# Patient Record
Sex: Female | Born: 1985
Health system: Southern US, Community
[De-identification: ages and names within clinical notes are randomized; demographics above are authoritative.]

## PROBLEM LIST (undated history)

## (undated) DIAGNOSIS — F329 Major depressive disorder, single episode, unspecified: Secondary | ICD-10-CM

## (undated) DIAGNOSIS — E282 Polycystic ovarian syndrome: Secondary | ICD-10-CM

## (undated) DIAGNOSIS — T8092XA Unspecified transfusion reaction, initial encounter: Secondary | ICD-10-CM

## (undated) DIAGNOSIS — F32A Depression, unspecified: Secondary | ICD-10-CM

## (undated) DIAGNOSIS — G4730 Sleep apnea, unspecified: Secondary | ICD-10-CM

## (undated) DIAGNOSIS — F419 Anxiety disorder, unspecified: Secondary | ICD-10-CM

## (undated) DIAGNOSIS — N200 Calculus of kidney: Secondary | ICD-10-CM

## (undated) DIAGNOSIS — B029 Zoster without complications: Secondary | ICD-10-CM

## (undated) DIAGNOSIS — T7840XA Allergy, unspecified, initial encounter: Secondary | ICD-10-CM

## (undated) DIAGNOSIS — N3010 Interstitial cystitis (chronic) without hematuria: Secondary | ICD-10-CM

## (undated) DIAGNOSIS — F909 Attention-deficit hyperactivity disorder, unspecified type: Secondary | ICD-10-CM

## (undated) DIAGNOSIS — F3181 Bipolar II disorder: Secondary | ICD-10-CM

## (undated) HISTORY — DX: Attention-deficit hyperactivity disorder, unspecified type: F90.9

## (undated) HISTORY — PX: OTHER SURGICAL HISTORY: SHX169

## (undated) HISTORY — DX: Allergy, unspecified, initial encounter: T78.40XA

## (undated) HISTORY — PX: KIDNEY STONE SURGERY: SHX686

## (undated) HISTORY — PX: TONSILLECTOMY AND ADENOIDECTOMY: SUR1326

## (undated) HISTORY — DX: Sleep apnea, unspecified: G47.30

## (undated) HISTORY — PX: TUBAL LIGATION: SHX77

## (undated) HISTORY — PX: KNEE ARTHROSCOPY: SHX127

## (undated) HISTORY — DX: Calculus of kidney: N20.0

## (undated) HISTORY — DX: Zoster without complications: B02.9

## (undated) HISTORY — DX: Anxiety disorder, unspecified: F41.9

## (undated) HISTORY — DX: Bipolar II disorder: F31.81

## (undated) HISTORY — DX: Depression, unspecified: F32.A

## (undated) HISTORY — DX: Major depressive disorder, single episode, unspecified: F32.9

## (undated) HISTORY — DX: Unspecified transfusion reaction, initial encounter: T80.92XA

---
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
## 2003-02-22 ENCOUNTER — Encounter: Payer: Self-pay | Admitting: Family Medicine

## 2003-02-22 LAB — CONVERTED CEMR LAB: TSH: 1.64 microintl units/mL

## 2003-02-27 ENCOUNTER — Emergency Department (HOSPITAL_COMMUNITY): Admission: AD | Admit: 2003-02-27 | Discharge: 2003-02-27 | Payer: Self-pay | Admitting: Family Medicine

## 2003-10-11 ENCOUNTER — Emergency Department (HOSPITAL_COMMUNITY): Admission: EM | Admit: 2003-10-11 | Discharge: 2003-10-11 | Payer: Self-pay | Admitting: Family Medicine

## 2003-11-08 ENCOUNTER — Encounter (INDEPENDENT_AMBULATORY_CARE_PROVIDER_SITE_OTHER): Payer: Self-pay | Admitting: *Deleted

## 2003-11-08 ENCOUNTER — Ambulatory Visit (HOSPITAL_COMMUNITY): Admission: RE | Admit: 2003-11-08 | Discharge: 2003-11-09 | Payer: Self-pay | Admitting: *Deleted

## 2004-08-14 ENCOUNTER — Ambulatory Visit: Payer: Self-pay | Admitting: Family Medicine

## 2004-09-11 ENCOUNTER — Inpatient Hospital Stay (HOSPITAL_COMMUNITY): Admission: AD | Admit: 2004-09-11 | Discharge: 2004-09-12 | Payer: Self-pay | Admitting: *Deleted

## 2004-09-14 ENCOUNTER — Other Ambulatory Visit: Admission: RE | Admit: 2004-09-14 | Discharge: 2004-09-14 | Payer: Self-pay | Admitting: Obstetrics and Gynecology

## 2004-12-06 ENCOUNTER — Ambulatory Visit (HOSPITAL_COMMUNITY): Admission: RE | Admit: 2004-12-06 | Discharge: 2004-12-06 | Payer: Self-pay | Admitting: Obstetrics and Gynecology

## 2004-12-06 ENCOUNTER — Encounter (INDEPENDENT_AMBULATORY_CARE_PROVIDER_SITE_OTHER): Payer: Self-pay | Admitting: Specialist

## 2005-01-01 ENCOUNTER — Other Ambulatory Visit: Admission: RE | Admit: 2005-01-01 | Discharge: 2005-01-01 | Payer: Self-pay | Admitting: Obstetrics and Gynecology

## 2005-02-16 ENCOUNTER — Encounter (INDEPENDENT_AMBULATORY_CARE_PROVIDER_SITE_OTHER): Payer: Self-pay | Admitting: Specialist

## 2005-02-16 ENCOUNTER — Ambulatory Visit (HOSPITAL_BASED_OUTPATIENT_CLINIC_OR_DEPARTMENT_OTHER): Admission: RE | Admit: 2005-02-16 | Discharge: 2005-02-16 | Payer: Self-pay | Admitting: Urology

## 2005-02-21 ENCOUNTER — Ambulatory Visit: Payer: Self-pay | Admitting: Family Medicine

## 2005-03-22 ENCOUNTER — Ambulatory Visit: Payer: Self-pay | Admitting: Family Medicine

## 2005-04-23 ENCOUNTER — Ambulatory Visit: Payer: Self-pay | Admitting: Family Medicine

## 2005-06-21 ENCOUNTER — Emergency Department (HOSPITAL_COMMUNITY): Admission: EM | Admit: 2005-06-21 | Discharge: 2005-06-21 | Payer: Self-pay | Admitting: Family Medicine

## 2005-11-16 ENCOUNTER — Encounter: Payer: Self-pay | Admitting: Family Medicine

## 2005-11-16 ENCOUNTER — Ambulatory Visit: Payer: Self-pay | Admitting: Family Medicine

## 2005-11-16 LAB — CONVERTED CEMR LAB: TSH: 1.24 microintl units/mL

## 2006-01-25 ENCOUNTER — Emergency Department (HOSPITAL_COMMUNITY): Admission: EM | Admit: 2006-01-25 | Discharge: 2006-01-25 | Payer: Self-pay | Admitting: Emergency Medicine

## 2006-01-30 ENCOUNTER — Ambulatory Visit: Payer: Self-pay | Admitting: Family Medicine

## 2006-05-07 ENCOUNTER — Ambulatory Visit: Payer: Self-pay | Admitting: Family Medicine

## 2006-05-07 LAB — CONVERTED CEMR LAB: H Pylori IgG: NEGATIVE

## 2006-05-13 ENCOUNTER — Ambulatory Visit: Payer: Self-pay | Admitting: Psychiatry

## 2006-05-21 ENCOUNTER — Ambulatory Visit: Payer: Self-pay | Admitting: Psychiatry

## 2006-05-29 ENCOUNTER — Ambulatory Visit: Payer: Self-pay | Admitting: Psychiatry

## 2006-06-07 ENCOUNTER — Ambulatory Visit: Payer: Self-pay | Admitting: Family Medicine

## 2006-06-10 ENCOUNTER — Ambulatory Visit: Payer: Self-pay | Admitting: Urology

## 2006-06-11 ENCOUNTER — Ambulatory Visit: Payer: Self-pay | Admitting: Psychiatry

## 2006-10-22 ENCOUNTER — Telehealth (INDEPENDENT_AMBULATORY_CARE_PROVIDER_SITE_OTHER): Payer: Self-pay | Admitting: *Deleted

## 2006-10-23 ENCOUNTER — Telehealth (INDEPENDENT_AMBULATORY_CARE_PROVIDER_SITE_OTHER): Payer: Self-pay | Admitting: *Deleted

## 2006-10-30 ENCOUNTER — Ambulatory Visit: Payer: Self-pay | Admitting: Family Medicine

## 2006-10-30 DIAGNOSIS — F4321 Adjustment disorder with depressed mood: Secondary | ICD-10-CM | POA: Insufficient documentation

## 2006-11-21 ENCOUNTER — Encounter: Payer: Self-pay | Admitting: Family Medicine

## 2006-11-21 DIAGNOSIS — N3010 Interstitial cystitis (chronic) without hematuria: Secondary | ICD-10-CM | POA: Insufficient documentation

## 2006-11-21 DIAGNOSIS — N200 Calculus of kidney: Secondary | ICD-10-CM | POA: Insufficient documentation

## 2006-12-02 ENCOUNTER — Ambulatory Visit: Payer: Self-pay | Admitting: Family Medicine

## 2006-12-02 DIAGNOSIS — B009 Herpesviral infection, unspecified: Secondary | ICD-10-CM | POA: Insufficient documentation

## 2006-12-12 ENCOUNTER — Ambulatory Visit: Payer: Self-pay | Admitting: Family Medicine

## 2006-12-12 DIAGNOSIS — M545 Low back pain, unspecified: Secondary | ICD-10-CM | POA: Insufficient documentation

## 2007-01-03 ENCOUNTER — Ambulatory Visit: Payer: Self-pay | Admitting: Internal Medicine

## 2007-01-03 DIAGNOSIS — M549 Dorsalgia, unspecified: Secondary | ICD-10-CM | POA: Insufficient documentation

## 2007-01-03 LAB — CONVERTED CEMR LAB
Bilirubin Urine: NEGATIVE
Glucose, Urine, Semiquant: NEGATIVE
Ketones, urine, test strip: NEGATIVE
Nitrite: NEGATIVE
Specific Gravity, Urine: <1.005
Urobilinogen, UA: 0.2
WBC Urine, dipstick: NEGATIVE
pH: 6.5

## 2007-01-09 ENCOUNTER — Ambulatory Visit (HOSPITAL_COMMUNITY): Admission: RE | Admit: 2007-01-09 | Discharge: 2007-01-09 | Payer: Self-pay | Admitting: Orthopaedic Surgery

## 2007-01-28 ENCOUNTER — Telehealth: Payer: Self-pay | Admitting: Family Medicine

## 2007-01-29 ENCOUNTER — Ambulatory Visit: Payer: Self-pay | Admitting: Family Medicine

## 2007-03-03 ENCOUNTER — Telehealth: Payer: Self-pay | Admitting: Family Medicine

## 2007-03-27 ENCOUNTER — Telehealth: Payer: Self-pay | Admitting: Family Medicine

## 2007-04-16 ENCOUNTER — Telehealth: Payer: Self-pay | Admitting: Family Medicine

## 2007-06-16 ENCOUNTER — Telehealth: Payer: Self-pay | Admitting: Family Medicine

## 2007-06-26 ENCOUNTER — Ambulatory Visit: Payer: Self-pay | Admitting: Family Medicine

## 2007-06-26 DIAGNOSIS — F411 Generalized anxiety disorder: Secondary | ICD-10-CM | POA: Insufficient documentation

## 2007-07-30 ENCOUNTER — Ambulatory Visit: Payer: Self-pay | Admitting: Family Medicine

## 2007-09-26 ENCOUNTER — Ambulatory Visit: Payer: Self-pay | Admitting: Obstetrics and Gynecology

## 2007-10-14 ENCOUNTER — Telehealth: Payer: Self-pay | Admitting: Family Medicine

## 2007-12-02 ENCOUNTER — Telehealth: Payer: Self-pay | Admitting: Family Medicine

## 2007-12-19 ENCOUNTER — Ambulatory Visit: Payer: Self-pay | Admitting: Family Medicine

## 2008-01-05 ENCOUNTER — Ambulatory Visit: Payer: Self-pay | Admitting: Family Medicine

## 2008-01-05 DIAGNOSIS — M5430 Sciatica, unspecified side: Secondary | ICD-10-CM | POA: Insufficient documentation

## 2008-01-05 DIAGNOSIS — M722 Plantar fascial fibromatosis: Secondary | ICD-10-CM | POA: Insufficient documentation

## 2008-01-09 ENCOUNTER — Telehealth (INDEPENDENT_AMBULATORY_CARE_PROVIDER_SITE_OTHER): Payer: Self-pay | Admitting: Internal Medicine

## 2008-01-28 ENCOUNTER — Emergency Department: Payer: Self-pay | Admitting: Emergency Medicine

## 2008-01-28 ENCOUNTER — Telehealth: Payer: Self-pay | Admitting: Family Medicine

## 2008-02-03 ENCOUNTER — Ambulatory Visit: Payer: Self-pay | Admitting: Family Medicine

## 2008-02-12 ENCOUNTER — Encounter: Payer: Self-pay | Admitting: Family Medicine

## 2008-04-08 ENCOUNTER — Inpatient Hospital Stay (HOSPITAL_COMMUNITY): Admission: EM | Admit: 2008-04-08 | Discharge: 2008-04-10 | Payer: Self-pay | Admitting: Emergency Medicine

## 2008-05-07 ENCOUNTER — Ambulatory Visit: Payer: Self-pay | Admitting: Family Medicine

## 2008-05-07 ENCOUNTER — Encounter: Payer: Self-pay | Admitting: Family Medicine

## 2008-05-07 LAB — CONVERTED CEMR LAB
ALT: 23 units/L (ref 0–35)
AST: 16 units/L (ref 0–37)
Albumin: 4.9 g/dL (ref 3.5–5.2)
Alkaline Phosphatase: 90 units/L (ref 39–117)
Amphetamine Screen, Ur: NEGATIVE
BUN: 10 mg/dL (ref 6–23)
Barbiturate Quant, Ur: NEGATIVE
Basophils Absolute: 0 10*3/uL (ref 0.0–0.1)
Basophils Relative: 1 % (ref 0–1)
Benzodiazepines.: NEGATIVE
CO2: 23 meq/L (ref 19–32)
Calcium: 9.6 mg/dL (ref 8.4–10.5)
Chloride: 104 meq/L (ref 96–112)
Cholesterol: 167 mg/dL (ref 0–200)
Cocaine Metabolites: NEGATIVE
Creatinine, Ser: 0.8 mg/dL (ref 0.40–1.20)
Creatinine,U: 232.3 mg/dL
Eosinophils Absolute: 0.1 10*3/uL (ref 0.0–0.7)
Eosinophils Relative: 2 % (ref 0–5)
Glucose, Bld: 89 mg/dL (ref 70–99)
HCT: 42.4 % (ref 36.0–46.0)
HDL: 58 mg/dL (ref >39)
Hemoglobin: 14.5 g/dL (ref 12.0–15.0)
LDL Cholesterol: 88 mg/dL (ref 0–99)
Lymphocytes Relative: 45 % (ref 12–46)
Lymphs Abs: 2.5 10*3/uL (ref 0.7–4.0)
MCHC: 34.2 g/dL (ref 30.0–36.0)
MCV: 88.3 fL (ref 78.0–100.0)
Marijuana Metabolite: NEGATIVE
Methadone: NEGATIVE
Microalb, Ur: 0.69 mg/dL (ref 0.00–1.89)
Monocytes Absolute: 0.3 10*3/uL (ref 0.1–1.0)
Monocytes Relative: 6 % (ref 3–12)
Neutro Abs: 2.6 10*3/uL (ref 1.7–7.7)
Neutrophils Relative %: 47 % (ref 43–77)
Opiate Screen, Urine: NEGATIVE
Phencyclidine (PCP): NEGATIVE
Platelets: 304 10*3/uL (ref 150–400)
Potassium: 4 meq/L (ref 3.5–5.3)
Propoxyphene: POSITIVE — AB
RBC: 4.8 M/uL (ref 3.87–5.11)
RDW: 13.6 % (ref 11.5–15.5)
Sodium: 141 meq/L (ref 135–145)
TSH: 1.998 microintl units/mL (ref 0.350–4.50)
Total Bilirubin: 0.6 mg/dL (ref 0.3–1.2)
Total CHOL/HDL Ratio: 2.9
Total Protein: 7.5 g/dL (ref 6.0–8.3)
Triglycerides: 104 mg/dL (ref <150)
VLDL: 21 mg/dL (ref 0–40)
WBC: 5.6 10*3/uL (ref 4.0–10.5)

## 2008-05-08 ENCOUNTER — Encounter: Payer: Self-pay | Admitting: Family Medicine

## 2008-05-18 ENCOUNTER — Encounter (INDEPENDENT_AMBULATORY_CARE_PROVIDER_SITE_OTHER): Payer: Self-pay | Admitting: Internal Medicine

## 2008-07-02 ENCOUNTER — Ambulatory Visit: Payer: Self-pay | Admitting: Urology

## 2009-01-27 ENCOUNTER — Emergency Department: Payer: Self-pay | Admitting: Unknown Physician Specialty

## 2009-02-10 ENCOUNTER — Inpatient Hospital Stay (HOSPITAL_COMMUNITY): Admission: AD | Admit: 2009-02-10 | Discharge: 2009-02-10 | Payer: Self-pay | Admitting: Obstetrics & Gynecology

## 2009-04-21 ENCOUNTER — Telehealth: Payer: Self-pay | Admitting: Family Medicine

## 2010-01-28 IMAGING — US US RENAL
1 series · 14 of 25 positions shown · non-contrast
Comparison: None

CLINICAL DATA: Follow up pyelonephritis

RENAL/URINARY TRACT ULTRASOUND
TECHNIQUE: Complete ultrasound examination of the urinary tract
was performed including evaluation of the kidneys, renal collecting
systems, and urinary bladder.

[Series 1: unknown · 0.40mm/px · 14 of 25 slices shown]
[im 1/25]
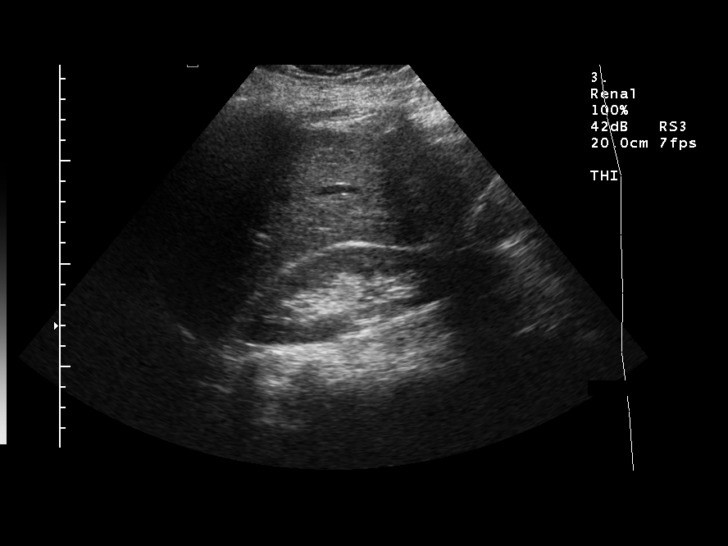
[im 3/25]
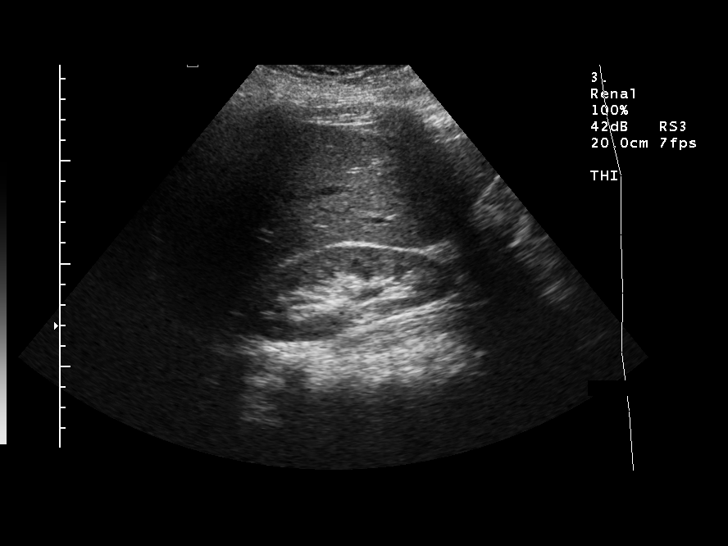
[im 5/25]
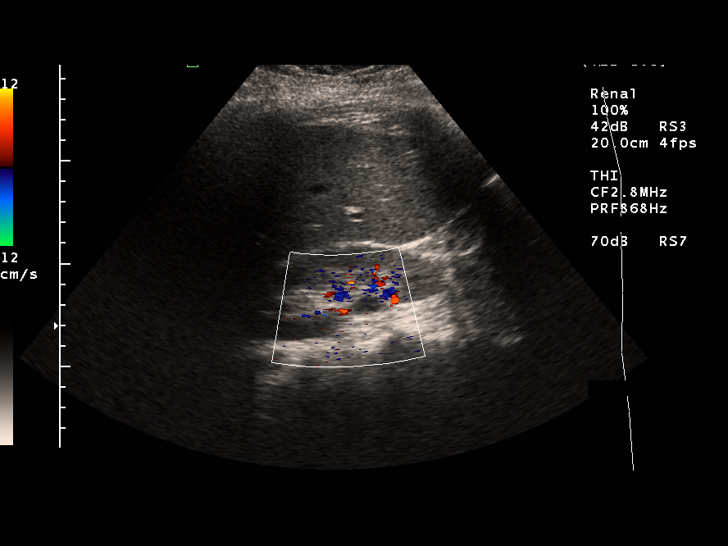
[im 7/25]
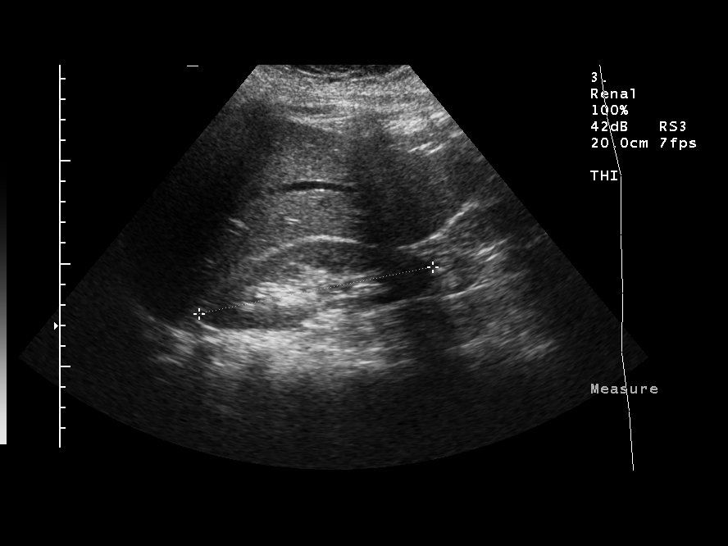
[im 9/25]
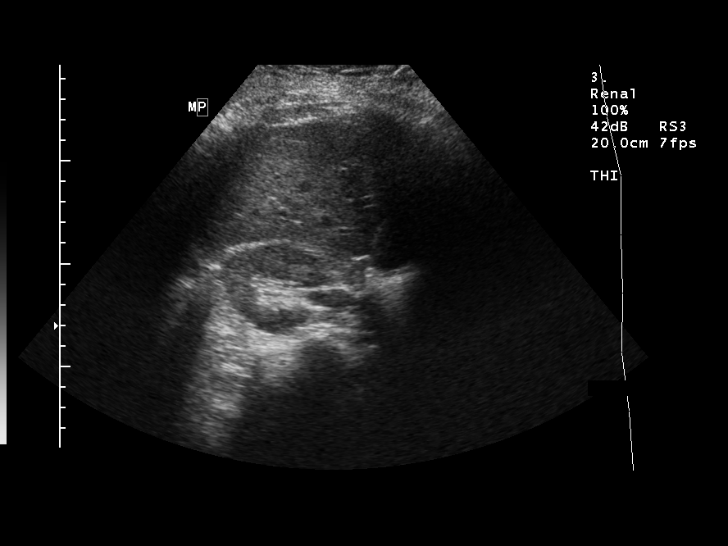
[im 10/25]
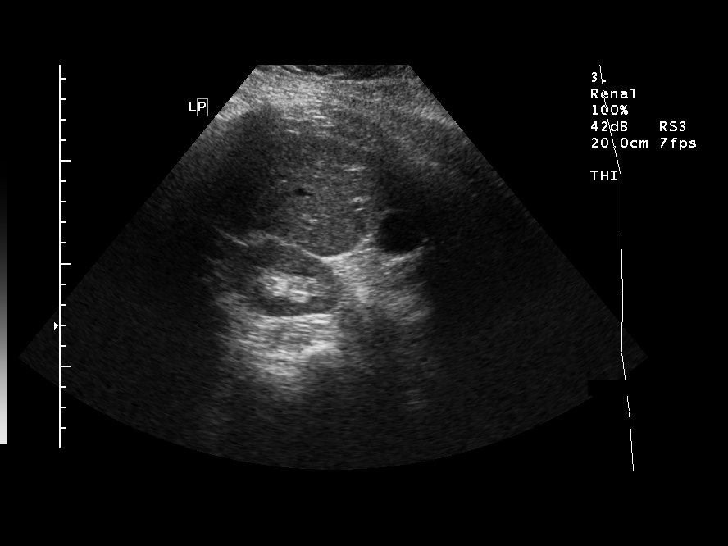
[im 12/25]
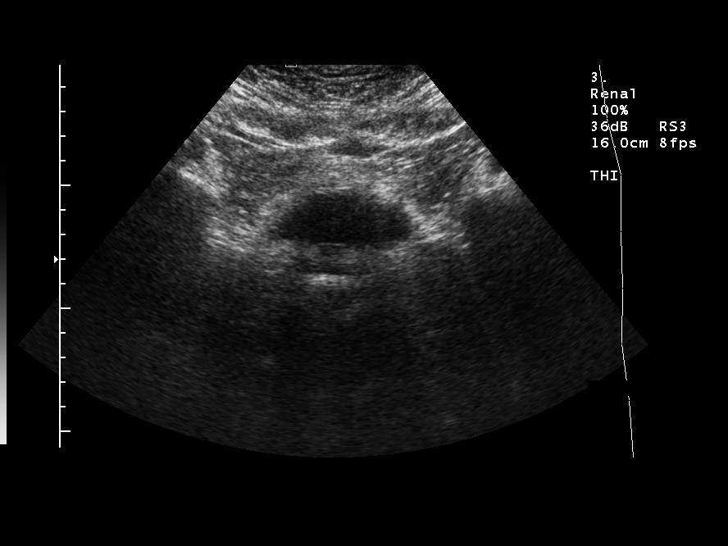
[im 14/25]
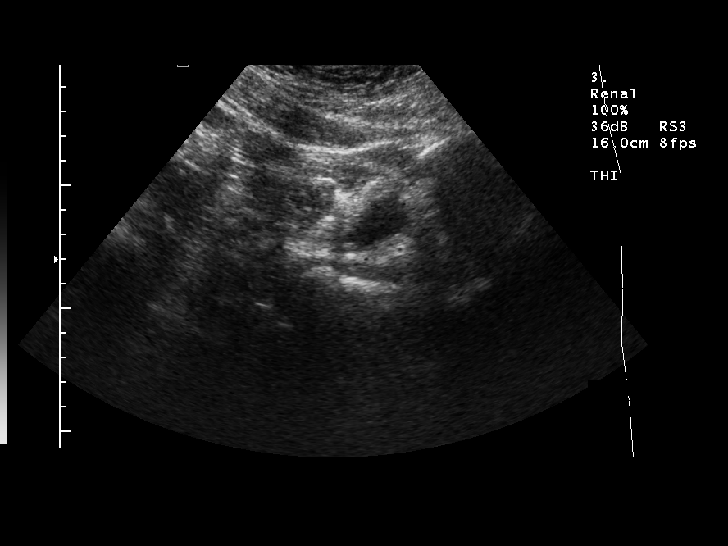
[im 16/25]
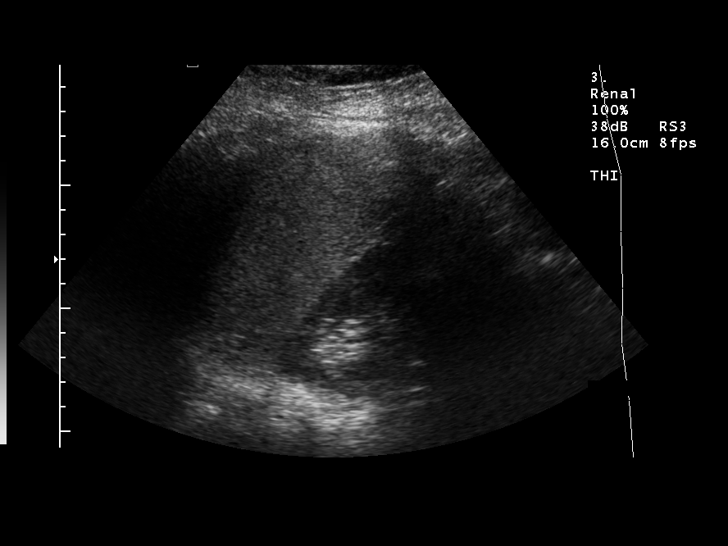
[im 17/25]
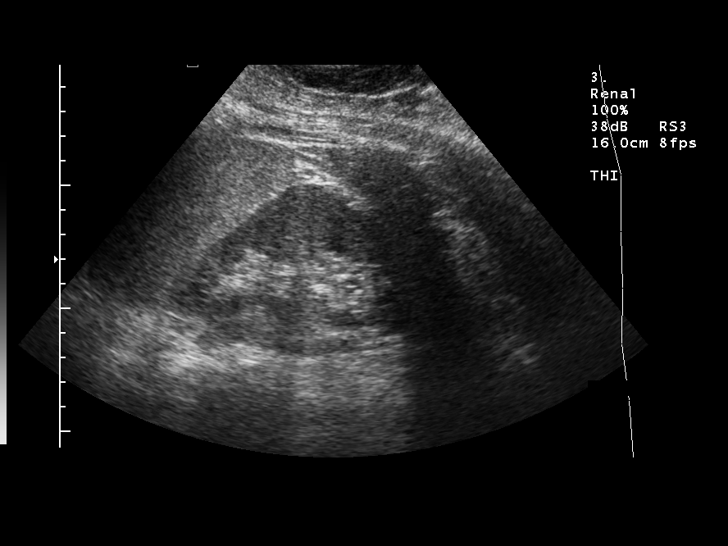
[im 19/25]
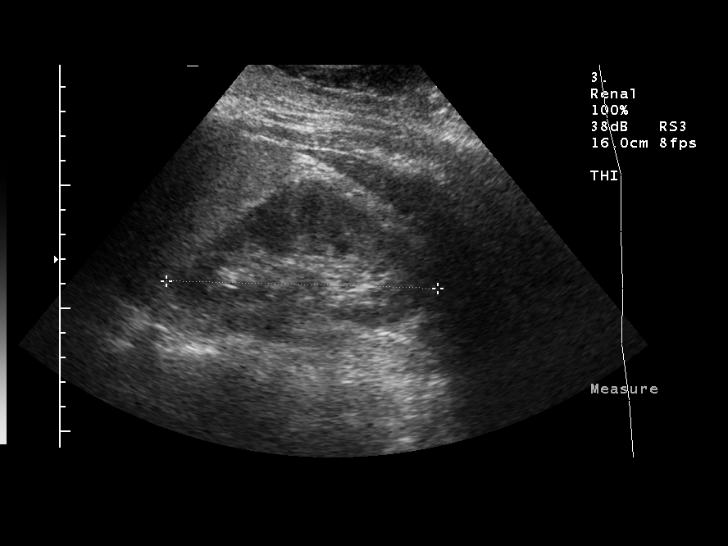
[im 21/25]
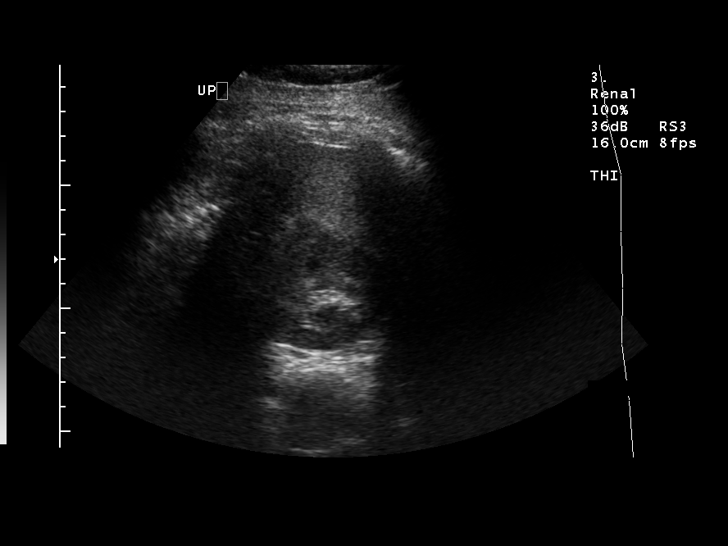
[im 23/25]
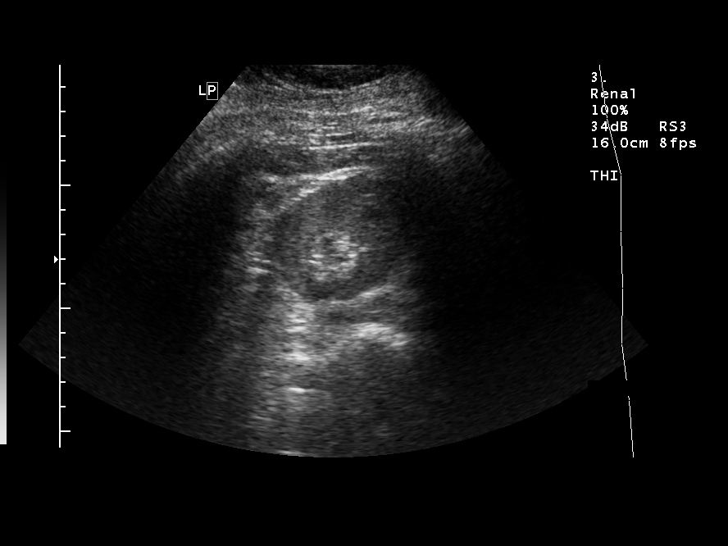
[im 25/25]
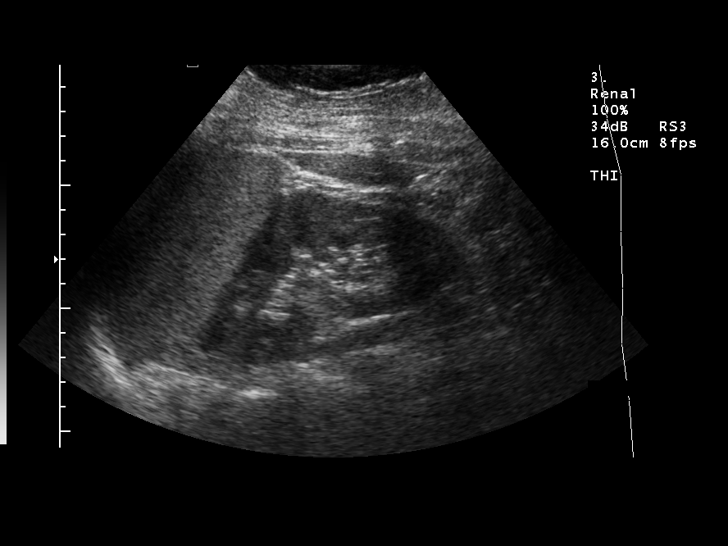

[14 of 25 positions shown; findings below may reference images not displayed]

FINDINGS: Right kidney measures 11.7 cm in length.  No
hydronephrosis or diagnostic renal calculus. Left kidney measures
11 cm in length.  No hydronephrosis or diagnostic renal calculus.
The urinary bladder is partially empty limiting its assessment.
IMPRESSION: No hydronephrosis or diagnostic renal calculus.  Partially empty
bladder limiting assessment.

## 2010-05-11 NOTE — Assessment & Plan Note (Signed)
Summary: R KNEE PAIN/CLE   Vital Signs:  Patient Profile:   25 Years Old Female Height:     59 inches (149.86 cm) Weight:      226.25 pounds (102.84 kg) Temp:     97.8 degrees F (36.56 degrees C) oral Pulse rate:   56 / minute Pulse rhythm:   regular BP sitting:   110 / 80  (left arm) Cuff size:   large  Vitals Entered By: Silas Sacramento (January 05, 2008 8:07 AM)                 Chief Complaint:  Right leg pain.  History of Present Illness: the patient is a 25 year old white female who presents with right-sided sciatica pain that has been ongoing for approximately 5 years. She has had a significant worsening in her pain over the last week, and as it also is gotten worse in the last one to one and a half months. She has had some right-sided numbness in the lateral and posterior aspect it radiates primarily to the knee, but occasionally it does radiate to her foot. She has no history of any trauma to her back. She has tried some over-the-counter anti-inflammatories, has tried some heat, but has not tried any ice.  She has not done any formal physical therapy. She has tried minimal amount of stretching at home very  R knee surgery, 1 year ago - arthroscopy, [facility], the patient is unclear exactly of what type of arthroscopy that she had. She had minimal rehabilitation afterwards.  She has gained a considerable amount of weight since the birth of her child approximately 5 years ago.  sitting R sciatica. 1 1/2 months. wakes up in middle of night.  Some LBP. on and ogg for a while.   worse last week.  no incontinence. no saddle anesthesia.  heat, n oice. heat helps some. has tried some stretching   2. Pt also c/o R heel pain. Worse with standing up and walking and in the morning.    Current Allergies: ! * LATEX  Past Surgical History:    Reviewed history from 11/21/2006 and no changes required:       CYSTOSCOPY WITH RENAL STENT :(10/2001)  ADENOTONSILLECTOMY: (DR LEE):(11/08/2003)       NSVD 03/2002       CT OF CHEST , NEGATIVE P.E. :(03/30/2002)       CT OF ABD. / PELVIS :(03/30/2002)       C/S  D&C 03/29/2002)       CYSTOURETHROSCOPY ( DR DAVIS)GLOMERULUS: (02/16/2005)       LAPROSCOPY 2ND TO PELVIC PAIN ; PCOS NEGATIVE ENDOMERER BY BX:(11/2004)   Social History:    Reviewed history from 12/19/2007 and no changes required:       Marital Status:SINGLE        Children: 1 SON       Occupation:     Review of Systems  General      Denies chills, fever, and sweats.  MS      Complains of low back pain and stiffness.      Denies joint pain, joint redness, and joint swelling.  Neuro      Complains of numbness and tingling.      Denies disturbances in coordination and falling down.   Physical Exam  Gen: Well-developed,well-nourished,in no acute distress; alert,appropriate and cooperative throughout examination  HEENT: Normocephalic and atraumatic without obvious abnormalities.  Ears, externally no deformities  Pulm: Breathing comfortably in no respiratory  distress  Range of motion at  the waist: Flexion: mild pain Extension: mild pain Rotation: mild pain  No echymosis No edema  Rises to examination table with no difficulty  Gait: minimally antalgic  Inspection/Deformity: N Paraspinus T: Y, R lateral lumber  B Ankle Dorsiflexion (L5,4): 5/5 B Great Toe Dorsiflexion (L5,4): 5/5 Heel Walk (L5): WNL Toe Walk (S1): WNL Rise/Squat (L4): WNL  SENSORY B Medial Foot (L4): WNL B Dorsum (L5): WNL B Lateral (S1): WNL Light Touch: WNL Pinprick: WNL  REFLEXES Knee (L4): 2+ Ankle (S1): 2+  B SLR, seated: neg B SLR, supine: neg B FABER: neg B Reverse FABER: neg B Greater Troch: NT B Log Roll: neg B Stork: NT B Sciatic Notch: NT Leg Lengths: equal  R FOOT Echymosis: no Edema: no ROM: full LE B Gait: heel toe MT pain: no Callus pattern: none Lateral Mall: NT Medial Mall: NT Talus:  NT Navicular: NT Calcaneous: NT Metatarsals: NT 5th MT: NT Phalanges: NT Achilles: NT Plantar Fascia: tender, medial along PF. Pain with forced dorsi Fat Pad: NT Peroneals: NT Post Tib: NT Great Toe: Nml motion Ant Drawer: neg Sensation: intact     Impression & Recommendations:  Problem # 1:  SCIATICA (ICD-724.3) Assessment: New Using an anatomical model, I reviewed with the patient the structures involved and how they related to her diagnosis. The patient indicated that they understood our discussion and the anatomy involved.  Reviewed rehab  Her updated medication list for this problem includes:    Zanaflex 4 Mg Tabs (Tizanidine hcl) .Marland Kitchen... 1 by mouth prior to bed  Orders: Physical Therapy Referral (PT)   Problem # 2:  PLANTAR FASCIITIS, RIGHT (ICD-728.71) Assessment: New Please see the patient instructions for a detailed list of plans and what was discussed with the patient.   The patient was given a handout from [facility]  describing the anatomy and rehabilitation of the following condition: Plantar Fascitis  Using an anatomical model, I reviewed with the patient the structures involved and how they related to her diagnosis. The patient indicated that they understood our discussion and the anatomy involved.   We reviewed that stretching is critically important to the treatment of PF. Reviewed footwear. Rigid soles have been shown to help with PF. Night splints can help. Reviewed rehab of stretching and calf raises.  The patient would be an ideal candidate for custom orthotics, which were shown in a 2008 Cochrane Review to be beneficial in PF. Could benefit from a corticosteroid injection if conservative treatment fails.  Complete Medication List: 1)  Alprazolam 0.5 Mg Tabs (Alprazolam) .... One at night 2)  Valtrex 1 Gm Tabs (Valacyclovir hcl) .Marland Kitchen.. 1 tablet two times a day as needed 3)  Buspar 15 Mg Tabs (Buspirone hcl) .... Take one tablet daily 4)  Zanaflex 4 Mg  Tabs (Tizanidine hcl) .Marland Kitchen.. 1 by mouth prior to bed   Patient Instructions: 1)  f/u 4-6 weeks   Prescriptions: ZANAFLEX 4 MG TABS (TIZANIDINE HCL) 1 by mouth prior to bed  #30 x 3   Entered and Authorized by:   Hannah Beat MD   Signed by:   Hannah Beat MD on 01/05/2008   Method used:   Print then Give to Patient   RxID:   [id number]  ]  Problems:  Problems Added: 1)  Dx of Sciatica  (ICD-724.3) 2)  Dx of Plantar Fasciitis, Right  (ICD-728.71)

## 2010-05-11 NOTE — Progress Notes (Signed)
Summary: rx xanax  Phone Note Refill Request Message from:  Fax from Pharmacy on October 14, 2007 2:46 PM  Refills Requested: Medication #1:  ALPRAZOLAM 0.5 MG  TABS one at night can't tell when last filled [facility] [place] [phone] fax  Initial call taken by: Providence Crosby,  October 14, 2007 2:48 PM  Follow-up for Phone Call        prescribtion called to [facility] but it was [facility]. Follow-up by: Providence Crosby,  October 14, 2007 3:27 PM      Prescriptions: ALPRAZOLAM 0.5 MG  TABS (ALPRAZOLAM) one at night  #30 x 0   Entered and Authorized by:   Shaune Leeks MD   Signed by:   Shaune Leeks MD on 10/14/2007   Method used:   Print then Give to Patient   RxID:   [id number]  Shaune Leeks MD  October 14, 2007 3:17 PM

## 2010-05-11 NOTE — Assessment & Plan Note (Signed)
Summary: BACK PAIN/CLE   Vital Signs:  Patient Profile:   25 Years Old Female Height:     59 inches (149.86 cm) Weight:      198 pounds Temp:     98.4 degrees F oral Pulse rate:   84 / minute BP sitting:   116 / 83  (right arm) Cuff size:   regular  Vitals Entered By: Cooper Render (December 12, 2006 10:57 AM)                 Chief Complaint:  low back pain and has taken vicodin.  History of Present Illness: Hlere due to back pain.  Tore meniscus R knee, now in PT walking on treadmill--has always caused pain.  Hurts worse when goes to bed--has taken vicodin 5/500 at times, takes IBP800 1qAM.  Takes vicodin after PT,  does not tolerate during day--makes sleepy.  Tramadol-- no help. Darvocet--vomits.  Current Allergies (reviewed today): ! * LATEX     Review of Systems      See HPI   Physical Exam  General:     alert, well-developed, well-nourished, well-hydrated, and overweight-appearing.  down 8 lbs in 2 wks--pleased, trying Head:     normocephalic, atraumatic, and no abnormalities observed.   Eyes:     pupils equal, pupils round, and no injection.   Lungs:     normal respiratory effort.   Msk:     pain over L2-4 with no radiation, pain on lat fles to L and ratation to R, otherwise neg.  Extremities:     no edema Neurologic:     alert & oriented X3, sensation intact to light touch, and gait normal.   Skin:     turgor normal, color normal, and no rashes.   Psych:     normally interactive and good eye contact.      Impression & Recommendations:  Problem # 1:  BACK PAIN, LUMBAR (ICD-724.2) Assessment: New increase IBP 800 to q8h as needed with food.--due to intoleralnce of other meds.  Complete Medication List: 1)  Prozac 40 Mg Caps (Fluoxetine hcl) .... One tab by mouth once daily 2)  Alprazolam 0.5 Mg Tabs (Alprazolam) .... 1/2 tab by mouth two times a day , one at night 3)  Aviane 0.1-20 Mg-mcg Tabs (Levonorgestrel-ethinyl estrad) .... Take 1 tablet  by mouth once a day 4)  Valtrex 1 Gm Tabs (Valacyclovir hcl) .... One tab by mouth bid   Patient Instructions: 1)  reviewed plan--agrees

## 2010-05-11 NOTE — Progress Notes (Signed)
Summary: asking for work note  Phone Note Call from Patient Call back at [phone]   Caller: Patient Call For: Billie Bean Summary of Call: Pt called and said she had to leave work last night due to nausea and vomiting, is not feeling well now.  She is asking for a work note for last night and tonight, but she may go back tonight if she feels better. Please advise. Initial call taken by: Lowella Petties,  January 09, 2008 11:54 AM  Follow-up for Phone Call        she will have to be seen for me to write her work note  Billie-Lynn Tyler Deis FNP  January 09, 2008 1:47 PM   Additional Follow-up for Phone Call Additional follow up Details #1::        advised pt, she will schedule for saturday clinic Additional Follow-up by: Lowella Petties,  January 09, 2008 3:23 PM

## 2010-05-11 NOTE — Progress Notes (Signed)
Summary: rx   Phone Note Call from Patient Call back at [phone]   Caller: Patient Call For: schaller Summary of Call: see previous triage, pt started the xanax she says it helps great during day but she still cannot get to sleep it has now been 3 days and cannot  sleep well. request rx for that as well  Initial call taken by: Liane Comber,  October 23, 2006 1:43 PM  Follow-up for Phone Call        have her take Xanax tab at supper and then again at bedtime.  Again, see me in two weeks. Follow-up by: Shaune Leeks MD,  October 23, 2006 1:49 PM  Additional Follow-up for Phone Call Additional follow up Details #1::        PATIENT ADVISED ..................................................................Marland KitchenLiane Comber  October 23, 2006 2:51 PM

## 2010-05-11 NOTE — Assessment & Plan Note (Signed)
Summary: F/U MEDICATION/CLE   Vital Signs:  Patient Profile:   25 Years Old Female Height:     59 inches (149.86 cm) Weight:      197 pounds Temp:     98.8 degrees F oral Pulse rate:   64 / minute Pulse rhythm:   regular BP sitting:   120 / 70  (left arm) Cuff size:   large  Vitals Entered By: Providence Crosby (January 29, 2007 12:04 PM)                 Chief Complaint:  F/U MEDICATIONS // C/O COUGH X 3 WEEKS.  History of Present Illness: Congested for last three weeks and now getting tightness in chest and coughing productive nasty greenish-yellow stuff. Has had low grade fever at night for last few days, no ear pain but had stuffiness with earplugs in ear, recent onset of rhinitis , sore throat and cough as above.  Taking guaifenesin and mucinex..nither of which have helped.  Took Nyquil at night with help with help with the cough.  Sleeping reasonably well but still has anxiety with good results taking Xanax now just when needed.  Current Allergies (reviewed today): ! * LATEX      Physical Exam  General:     Well-developed,well-nourished,in no acute distress; alert,appropriate and cooperative throughout examination Head:     Normocephalic and atraumatic without obvious abnormalities. No apparent alopecia or balding. Eyes:     Conjunctiva clear bilaterally.  Ears:     External ear exam shows no significant lesions or deformities.  Otoscopic examination reveals clear canals, tympanic membranes are intact bilaterally without bulging, retraction, inflammation or discharge. Hearing is grossly normal bilaterally. Nose:     inflamed with swollen mucous membr Mouth:     Oral mucosa and oropharynx without lesions or exudates.  Teeth in good repair. Tan PND. Neck:     No deformities, masses, or tenderness noted. Chest Wall:     No deformities, masses, or tenderness noted. Lungs:     Ronchi throughout left lung.    Impression & Recommendations:  Problem # 1:  URI  (ICD-465.9) Assessment: New Amox Guaifenesin Tyl Fluids Instructed on symptomatic treatment. Call if symptoms persist or worsen.   Problem # 2:  GRIEF REACTION (ICD-309.0) Assessment: Improved  Complete Medication List: 1)  Prozac 40 Mg Caps (Fluoxetine hcl) .... One tab by mouth once daily 2)  Alprazolam 0.5 Mg Tabs (Alprazolam) .... 1/2 tab by mouth two times a day , one at night 3)  Aviane 0.1-20 Mg-mcg Tabs (Levonorgestrel-ethinyl estrad) .... Take 1 tablet by mouth once a day 4)  Amoxicillin 500 Mg Caps (Amoxicillin) .... Two tabs by mouth bid   Patient Instructions: 1)  Take Amox 2)  Take: 3)  GUAIFENESIN  600mg  by mouth AM and NOON   4)    ROBITUSSIN PLAIN (NO LETTERS, NO NAMES) two tablespoons  or 5)    [facility] MUCOUS RELIEF EXPECTORANT (400 mg) 11/2 TABS  or 6)    GUAIFENESIN (200 MG) 3 TABS    7)  Tyl 2 tabs by mouth q4 hours. 8)  Gargle.                     Prescriptions: AVIANE 0.1-20 MG-MCG TABS (LEVONORGESTREL-ETHINYL ESTRAD) Take 1 tablet by mouth once a day  #3 packs x 4   Entered and Authorized by:   Shaune Leeks MD   Signed by:   Shaune Leeks MD  on 01/29/2007   Method used:   Print then Give to Patient   RxID:   [id number] AMOXICILLIN 500 MG  CAPS (AMOXICILLIN) two tabs by mouth bid  #40 x 0   Entered and Authorized by:   Shaune Leeks MD   Signed by:   Shaune Leeks MD on 01/29/2007   Method used:   Print then Give to Patient   RxID:   [id number] PROZAC 40 MG  CAPS (FLUOXETINE HCL) one tab by mouth once daily  #90 x 4   Entered and Authorized by:   Shaune Leeks MD   Signed by:   Shaune Leeks MD on 01/29/2007   Method used:   Print then Give to Patient   RxID:   [id number]  ]

## 2010-05-11 NOTE — Progress Notes (Signed)
Summary: Rx Valtrex  Phone Note Refill Request Call back at [phone] Message from:  [facility] on April 21, 2009 11:36 AM  Refills Requested: Medication #1:  VALTREX 1 GM  TABS 1 TABLET two times a day AS NEEDED   Last Refilled: 10/27/2007 Received faxed refill request.  Please advise   Method Requested: Electronic Initial call taken by: Linde Gillis CMA Duncan Dull),  April 21, 2009 11:37 AM  Follow-up for Phone Call        [facility] to send in one refill. Follow-up by: Ruthe Mannan MD,  April 21, 2009 11:39 AM  Additional Follow-up for Phone Call Additional follow up Details #1::        Rx called to pharmacy, #30 with no additional refills Additional Follow-up by: Linde Gillis CMA Duncan Dull),  April 21, 2009 12:19 PM

## 2010-05-11 NOTE — Progress Notes (Signed)
Summary: xanax  Phone Note From Pharmacy   [phone] Summary of Call: xanax 0.5 mg #30 with one refill// faxed to [facility] Initial call taken by: Providence Crosby,  January 28, 2007 1:51 PM

## 2010-05-11 NOTE — Assessment & Plan Note (Signed)
Summary: sore throat/fever/dlol   Vital Signs:  Patient Profile:   25 Years Old Female Height:     59 inches (149.86 cm) Weight:      222 pounds Temp:     98.8 degrees F oral Pulse rate:   81 / minute BP sitting:   98 / 74  (right arm) Cuff size:   large  Vitals Entered By: Cooper Render (December 19, 2007 3:17 PM)                 Chief Complaint:  had fever yest, sent home from work, needs note to return, and ck shin splints.  History of Present Illness: Here due to fever--101 to 102 and ST yesterday--sent home at 3am today, needs note to return to work. Has scratchy throat and HA this pm.  No further tylenol today,  slept when got home.  Off tonight.  Feels OK now, but not great. --works at [employer], 1 resident is sick, co workers not sick.    Here with husband who is not sick.    Current Allergies (reviewed today): ! * LATEX     Marital Status:SINGLE     Children: 1 SON    Occupation:     Review of Systems      See HPI   Physical Exam  General:     alert, well-developed, well-nourished, and well-hydrated.  NAD Ears:     TMs retracted with some fluid Nose:     no mucosal edema, no airflow obstruction, and mucosal erythema.  sinuses neg Mouth:     no exudates and pharyngeal erythema.   Lungs:     normal respiratory effort, no intercostal retractions, no accessory muscle use, and normal breath sounds.   Axillary Nodes:     no R axillary adenopathy and no L axillary adenopathy.   Psych:     normally interactive and flat affect.      Impression & Recommendations:  Problem # 1:  VIRAL INFECTION-UNSPEC (ICD-079.99) Assessment: New continue comfort care measures: increase po fluids, rest, tylenol or IBP as needed gargle as needed note to return to work tomorrow evening if not [employer], will not return to work and will see me next week The following medications were removed from the medication list:    Tessalon Perles 100 Mg Caps  (Benzonatate) .Marland Kitchen... 1 every 8hrs as needed cough   Complete Medication List: 1)  Alprazolam 0.5 Mg Tabs (Alprazolam) .... One at night 2)  Valtrex 1 Gm Tabs (Valacyclovir hcl) .Marland Kitchen.. 1 tablet two times a day as needed   Patient Instructions: 1)  schedule fasting labs--[facility] panel and lipids--v70.0   ] Prior Medications (reviewed today): ALPRAZOLAM 0.5 MG  TABS (ALPRAZOLAM) one at night VALTREX 1 GM  TABS (VALACYCLOVIR HCL) 1 TABLET two times a day AS NEEDED Current Allergies (reviewed today): ! * LATEX

## 2010-05-11 NOTE — Progress Notes (Signed)
Summary: refill xanax  Phone Note Refill Request Message from:  Fax from Pharmacy on January 28, 2008 9:45 AM  Refills Requested: Medication #1:  ALPRAZOLAM 0.5 MG  TABS one at night   Last Refilled: 12/02/2007 [facility] [address] [phone]  Initial call taken by: Providence Crosby,  January 28, 2008 9:46 AM  Follow-up for Phone Call        PRESCRIBTION CALLED TO PHARMACY LINE Follow-up by: Providence Crosby,  January 28, 2008 1:57 PM      Prescriptions: ALPRAZOLAM 0.5 MG  TABS (ALPRAZOLAM) one at night  #30 x 0   Entered and Authorized by:   Shaune Leeks MD   Signed by:   Shaune Leeks MD on 01/28/2008   Method used:   Telephoned to ...         RxID:   [id number]

## 2010-05-11 NOTE — Assessment & Plan Note (Signed)
Summary: F/U/CLE   Vital Signs:  Patient Profile:   25 Years Old Female Height:     59 inches (149.86 cm) Weight:      231 pounds Temp:     97.9 degrees F oral Pulse rate:   71 / minute BP sitting:   124 / 87  (right arm) Cuff size:   regular  Vitals Entered By: Windell Norfolk (February 03, 2008 12:02 PM)                 Chief Complaint:  Back Pain.  History of Present Illness: Here for continued pain in lower back and numbness down R leg--has pain in buttocks to R lateral  leg. Some days not bad, some days is almost unbearable. Did not go to PT due to not covered by insurance. Was seen 01/05/08 by Dr Patsy Lager adn started on Zanaflex--not helping. --using IBP 600 two times a day --helps some --left work to be seen--has missed 2 other days due to back    Updated Prior Medication List: ALPRAZOLAM 0.5 MG  TABS (ALPRAZOLAM) one at night VALTREX 1 GM  TABS (VALACYCLOVIR HCL) 1 TABLET two times a day AS NEEDED ZANAFLEX 4 MG TABS (TIZANIDINE HCL) 1 by mouth prior to bed  Current Allergies (reviewed today): ! * LATEX     Review of Systems      See HPI   Physical Exam  General:     alert, well-developed, well-nourished, well-hydrated, and overweight-appearing.   sitting in exam room Msk:     tender over L 2-5 spine and similar R paraspinous and into buttocks and down R lat leg to heel --minimal pain with forward flex to 60degrees.  has pain on lat flexes and rotation bilat Neurologic:     uses hands torise from chair, first few steps slow, then gait to nlalert & oriented X3 and sensation intact to light touch.   Psych:     normally interactive.      Impression & Recommendations:  Problem # 1:  SCIATICA (ICD-724.3) Assessment: Unchanged due to continued pain, will refer to Ortho continue IBP as needed with food see back as needed  Her updated medication list for this problem includes:    Zanaflex 4 Mg Tabs (Tizanidine hcl) .Marland Kitchen... 1 by mouth prior to  bed  Orders: Orthopedic Referral (Ortho)   Complete Medication List: 1)  Alprazolam 0.5 Mg Tabs (Alprazolam) .... One at night 2)  Valtrex 1 Gm Tabs (Valacyclovir hcl) .Marland Kitchen.. 1 tablet two times a day as needed 3)  Zanaflex 4 Mg Tabs (Tizanidine hcl) .Marland Kitchen.. 1 by mouth prior to bed   Patient Instructions: 1)  refer to Ortho   ]

## 2010-05-11 NOTE — Assessment & Plan Note (Signed)
Summary: ?KIDNEY STON/CLE   Vital Signs:  Patient Profile:   25 Years Old Female Height:     59 inches (149.86 cm) Weight:      204.25 pounds Temp:     97.9 degrees F oral Pulse rate:   72 / minute BP sitting:   134 / 88  (right arm)  Vitals Entered By: Wandra Mannan (January 03, 2007 2:37 PM)                 Chief Complaint:  ? kidney stone.  History of Present Illness: Thinks she has a kidney stone Many in past years but not really in last 3-4 years Feels like she has had bladder infections and has right back pain this has been associated with kidney stones in past Back pain got real bad this AM  Some dysuria but mostly in back frequency all the time due to interstitial cystitis No hematuria No fever some feelings of being cold at night and then a sweat  Current Allergies (reviewed today): ! * LATEX     Review of Systems      See HPI       Having arthroscopy on right knee next week No nausea or vomiting   Physical Exam  General:     alert and normal appearance.   Abdomen:     soft and non-tender.   Msk:     right CVA tenderness    Impression & Recommendations:  Problem # 1:  BACKACHE NOS (ICD-724.5) Assessment: Comment Only continues Was concerned about possible stone but eval not suggestive of this Will have her drink a lot to flush bladder and kidneys Consider CT scan to look for stone if persists Orders: UA Dipstick W/ Micro (81000)   Complete Medication List: 1)  Prozac 40 Mg Caps (Fluoxetine hcl) .... One tab by mouth once daily 2)  Alprazolam 0.5 Mg Tabs (Alprazolam) .... 1/2 tab by mouth two times a day , one at night 3)  Aviane 0.1-20 Mg-mcg Tabs (Levonorgestrel-ethinyl estrad) .... Take 1 tablet by mouth once a day   Patient Instructions: 1)  Please schedule a follow-up appointment as needed.    ] Current Allergies (reviewed today): ! * LATEX Current Medications (including changes made in today's visit):  PROZAC 40 MG   CAPS (FLUOXETINE HCL) one tab by mouth once daily ALPRAZOLAM 0.5 MG  TABS (ALPRAZOLAM) 1/2 tab by mouth two times a day , one at night AVIANE 0.1-20 MG-MCG TABS (LEVONORGESTREL-ETHINYL ESTRAD) Take 1 tablet by mouth once a day   Laboratory Results   Urine Tests  Date/Time Recieved: 01/03/2007  Routine Urinalysis   Color: yellow Appearance: Clear Glucose: negative   (Normal Range: Negative) Bilirubin: negative   (Normal Range: Negative) Ketone: negative   (Normal Range: Negative) Spec. Gravity: <1.005   (Normal Range: 1.003-1.035) Blood: moderate   (Normal Range: Negative) pH: 6.5   (Normal Range: 5.0-8.0) Protein: trace   (Normal Range: Negative) Urobilinogen: 0.2   (Normal Range: 0-1) Nitrite: negative   (Normal Range: Negative) Leukocyte Esterace: negative   (Normal Range: Negative)  Urine Microscopic WBC/hpf: very rare RBC/hpf: 1-3 Bacteria: rare Epithelial: occ

## 2010-05-11 NOTE — Progress Notes (Signed)
Summary: refill xanax  Phone Note Refill Request Message from:  [employer] on June 16, 2007 8:58 AM  Refills Requested: Medication #1:  ALPRAZOLAM 0.5 MG  TABS 1/2 tab by mouth two times a day   Last Refilled: 05/20/2007 rx  called to [facility]  at [phone]   Method Requested: called to pharmacy on line Initial call taken by: Providence Crosby,  June 16, 2007 8:59 AM  Follow-up for Phone Call        px written on EMR for call in  Follow-up by: Judith Part MD,  June 16, 2007 10:28 AM      Prescriptions: ALPRAZOLAM 0.5 MG  TABS (ALPRAZOLAM) 1/2 tab by mouth two times a day , one at night  #60 x 0   Entered and Authorized by:   Judith Part MD   Signed by:   Judith Part MD on 06/16/2007   Method used:   Telephoned to ...         RxID:   [id number]

## 2010-05-11 NOTE — Progress Notes (Signed)
  Phone Note Refill Request Message from:  Fax from Pharmacy on April 16, 2007 12:28 PM  Refills Requested: Medication #1:  ALPRAZOLAM 0.5 MG  TABS 1/2 tab by mouth two times a day   Last Refilled: 03/03/2007 #60 WITH 5 REFILLS FAXED TO [facility] [place]   Method Requested: Fax to Local Pharmacy Initial call taken by: Providence Crosby,  April 16, 2007 12:29 PM

## 2010-05-11 NOTE — Progress Notes (Signed)
Summary: medicaid pt?  Phone Note Other Incoming Call back at [phone]   Call placed by: Tacy Learn- [facility] [place] Summary of Call: they have a medicaid application for pt, pt told them that you said it was ok for her to use medicaid here at the office. Is this true? if so they need to know your [facility] access provider # Initial call taken by: Liane Comber,  March 27, 2007 9:21 AM  Follow-up for Phone Call        I don't think I have a Medicaid Access Provider number.Marland KitchenMarland KitchenI don't think I can see her if using Medicaid.Marland KitchenMarland KitchenIf there is some way that I can, I would be willing to do that.  I don't think the system will allow me to do that for one pt. Follow-up by: Shaune Leeks MD,  March 27, 2007 10:44 AM  Additional Follow-up for Phone Call Additional follow up Details #1::        Myriam Jacobson do you know if he has a [place] acces # and if we could continue to see pt? ..................................................................Marland KitchenLiane Comber  March 27, 2007 10:59 AM     Additional Follow-up for Phone Call Additional follow up Details #2::     I spoke with this person also , Dr Hetty Ely does not have a medicaid number Follow-up by: Darral Dash,  March 27, 2007 4:39 PM

## 2010-05-11 NOTE — Progress Notes (Signed)
Summary: xanax rx  Phone Note Call from Patient Call back at [phone]   Caller: Patient Call For: schaller Summary of Call: pt request xanax refill Initial call taken by: Liane Comber,  March 03, 2007 1:02 PM  Additional Follow-up for Phone Call Additional follow up Details #1::        PATIENT NOTIFIED AND RX FAXED TO [facility] AT [phone] Additional Follow-up by: Providence Crosby,  March 03, 2007 2:10 PM      Prescriptions: ALPRAZOLAM 0.5 MG  TABS (ALPRAZOLAM) 1/2 tab by mouth two times a day , one at night  #60 x 2   Entered and Authorized by:   Shaune Leeks MD   Signed by:   Shaune Leeks MD on 03/03/2007   Method used:   Print then Give to Patient   RxID:   [id number]

## 2010-05-11 NOTE — Assessment & Plan Note (Signed)
Summary: DISCUSS MEDICATION/CLE   Vital Signs:  Patient Profile:   25 Years Old Female Height:     59 inches (149.86 cm) Weight:      205 pounds Temp:     98.8 degrees F oral Pulse rate:   68 / minute BP sitting:   112 / 85  (right arm) Cuff size:   large  Vitals Entered By: Cooper Render (June 26, 2007 2:07 PM)                 Chief Complaint:  discuss meds.  History of Present Illness: Here to discuss meds--prozac and xanax---has become more  anxious, snappy at everything and everybody, wedding in 2 wks, finances better.  No other changes.  Better with brother's death.  Has been on prozac since high school for yrs, off for some time, back on, off during pregnancyand back on now for  ~62yr.  not working as well as did initially. --taking Xanax 1 at bedtime for 65mo. --Having increased anxiety attacks frequently  Here with fiance    Current Allergies (reviewed today): ! * LATEX     Review of Systems      See HPI   Physical Exam  General:     alert, well-developed, well-nourished, well-hydrated, and overweight-appearing.  NAD Eyes:     pupils equal and pupils round.   Neurologic:     alert & oriented X3 and gait normal.   Psych:     Oriented X3, normally interactive, good eye contact, not anxious appearing, flat affect, and subdued.      Impression & Recommendations:  Problem # 1:  ANXIETY DISORDER (ICD-300.00) Assessment: Deteriorated due to increased anxiety and continued depression, will add Buspar to current dose of prozac see back in 1 mo to follow up--sooner if needed Her updated medication list for this problem includes:    Prozac 40 Mg Caps (Fluoxetine hcl) ..... One tab by mouth once daily    Alprazolam 0.5 Mg Tabs (Alprazolam) ..... One at night    Buspar 15 Mg Tabs (Buspirone hcl) .Marland Kitchen... 1/2 two times a day   Complete Medication List: 1)  Prozac 40 Mg Caps (Fluoxetine hcl) .... One tab by mouth once daily 2)  Alprazolam 0.5 Mg Tabs  (Alprazolam) .... One at night 3)  Aviane 0.1-20 Mg-mcg Tabs (Levonorgestrel-ethinyl estrad) .... Take 1 tablet by mouth once a day 4)  Buspar 15 Mg Tabs (Buspirone hcl) .... 1/2 two times a day   Patient Instructions: 1)  Please schedule a follow-up appointment in 1 month.    Prescriptions: BUSPAR 15 MG  TABS (BUSPIRONE HCL) 1/2 two times a day  #30 x 1   Entered and Authorized by:   Gildardo Griffes FNP   Signed by:   Gildardo Griffes FNP on 06/26/2007   Method used:   Print then Give to Patient   RxID:   [id number]  ] Prior Medications (reviewed today): PROZAC 40 MG  CAPS (FLUOXETINE HCL) one tab by mouth once daily ALPRAZOLAM 0.5 MG  TABS (ALPRAZOLAM) one at night AVIANE 0.1-20 MG-MCG TABS (LEVONORGESTREL-ETHINYL ESTRAD) Take 1 tablet by mouth once a day Current Allergies (reviewed today): ! * LATEX

## 2010-05-11 NOTE — Letter (Signed)
Summary: [facility] / F/U OF EMERGENCY ROOM VISIT / DR. Lorene Dy [facility] CARE / F/U OF EMERGENCY ROOM VISIT / DR. Lorene Dy [facility]   Imported By: Carin Primrose 06/24/2008 10:15:42  _____________________________________________________________________  External Attachment:    Type:   Image     Comment:   External Document  Appended Document: [facility] CARE / F/U OF EMERGENCY ROOM VISIT / DR. Lorene Dy COO phone call to patient:  pap 05/2008 came back abnormal, had another at [facility] and was nl--to have repeat in 6 mo.  No cancer found  Appended Document: [facility] / F/U OF EMERGENCY ROOM VISIT / DR. Lorene Dy COO Pt notified by way of telephone.

## 2010-05-11 NOTE — Assessment & Plan Note (Signed)
Summary: FOLLOW UP   Vital Signs:  Patient Profile:   25 Years Old Female Height:     59 inches (149.86 cm) Weight:      206 pounds Temp:     99.1 degrees F oral Pulse rate:   72 / minute Pulse rhythm:   regular BP sitting:   110 / 70  (left arm) Cuff size:   large  Vitals Entered By: Providence Crosby (December 02, 2006 11:29 AM)               Chief Complaint:  f/u.  History of Present Illness: Doing better...increased Prozac last time for depression and anxiety from Adopted brother committing suicide. She is sleeping better but anticipating autopsy report. Takes Xanax rarely. Works at the [employer]) and has started working out regularly...Marland Kitchenfeels better doing that. Needs script for fever blisters...gets them wth stress and has taken her last ones a few days ago.  Current Allergies (reviewed today): ! * LATEX      Physical Exam  General:     Well-developed,well-nourished,in no acute distress; alert,appropriate and cooperative throughout examination Head:     Normocephalic and atraumatic without obvious abnormalities. No apparent alopecia or balding. Eyes:     Conjunctiva clear bilaterally.  Ears:     External ear exam shows no significant lesions or deformities.  Otoscopic examination reveals clear canals, tympanic membranes are intact bilaterally without bulging, retraction, inflammation or discharge. Hearing is grossly normal bilaterally. Nose:     External nasal examination shows no deformity or inflammation. Nasal mucosa are pink and moist without lesions or exudates. Mouth:     Oral mucosa and oropharynx without lesions or exudates.  Teeth in good repair. Neck:     No deformities, masses, or tenderness noted. Chest Wall:     No deformities, masses, or tenderness noted. Lungs:     Normal respiratory effort, chest expands symmetrically. Lungs are clear to auscultation, no crackles or wheezes. Heart:     Normal rate and regular rhythm. S1 and S2 normal without  gallop, murmur, click, rub or other extra sounds. Psych:     Cognition and judgment appear intact. Alert and cooperative with normal attention span and concentration. No apparent delusions, illusions, hallucinations    Impression & Recommendations:  Problem # 1:  GRIEF REACTION (ICD-309.0) Assessment: Improved Cont meds as she is. RTC as needed .  Complete Medication List: 1)  Prozac 40 Mg Caps (Fluoxetine hcl) .... One tab by mouth once daily 2)  Alprazolam 0.5 Mg Tabs (Alprazolam) .... 1/2 tab by mouth two times a day , one at night 3)  Aviane 0.1-20 Mg-mcg Tabs (Levonorgestrel-ethinyl estrad) .... Take 1 tablet by mouth once a day 4)  Valtrex 1 Gm Tabs (Valacyclovir hcl) .... One tab by mouth bid   Patient Instructions: 1)  Please schedule a follow-up appointment as needed.    Prescriptions: VALTREX 1 GM  TABS (VALACYCLOVIR HCL) one tab by mouth bid  #30 x 0   Entered and Authorized by:   Shaune Leeks MD   Signed by:   Shaune Leeks MD on 12/02/2006   Method used:   Print then Give to Patient   RxID:   [id number]

## 2010-05-11 NOTE — Progress Notes (Signed)
Summary: RX FOR ALPRAZOLAM  Phone Note Refill Request Message from:  Fax from Pharmacy on December 02, 2007 12:37 PM  Refills Requested: Medication #1:  ALPRAZOLAM 0.5 MG  TABS one at night   Last Refilled: 10/14/2007 [facility] [place] [phone]  Initial call taken by: Providence Crosby,  December 02, 2007 12:38 PM  Follow-up for Phone Call        PRESCRIPTION CALLED TO [facility] FINALLY Follow-up by: Providence Crosby,  December 02, 2007 5:37 PM      Prescriptions: ALPRAZOLAM 0.5 MG  TABS (ALPRAZOLAM) one at night  #30 x 0   Entered and Authorized by:   Shaune Leeks MD   Signed by:   Shaune Leeks MD on 12/02/2007   Method used:   Print then Give to Patient   RxID:   [id number]

## 2010-05-11 NOTE — Assessment & Plan Note (Signed)
Summary: 1 m f/u  dlo   Vital Signs:  Patient Profile:   25 Years Old Female Height:     59 inches (149.86 cm) Weight:      209 pounds Temp:     98.4 degrees F oral Pulse rate:   76 / minute Pulse rhythm:   regular BP sitting:   134 / 86  (left arm) Cuff size:   large  Vitals Entered By: Providence Crosby (July 30, 2007 3:55 PM)                 Chief Complaint:  1 month followup.  History of Present Illness: Here for follow up of addition of Buspat 15mg  1/2 two times a day 11mo ago.  Thinks would  be better if dose bumped  up.  Taking Prozac 20 two times a day and is easy for her to do the 2 together.  Had BTL on 07/25/07--will stop OCPs with the end of this pkg of pills. Now married x2wks--very happy.  Here with husband.    Prior Medications Reviewed Using: Patient Recall  Current Allergies (reviewed today): ! * LATEX     Review of Systems      See HPI   Physical Exam  General:     alert, well-developed, well-nourished, well-hydrated, and overweight-appearing.   Neurologic:     alert & oriented X3 and gait normal.   Psych:     normally interactive, good eye contact, not anxious appearing, and not depressed appearing.      Impression & Recommendations:  Problem # 1:  ANXIETY DISORDER (ICD-300.00) Assessment: Improved wants to try increased dose, will increase t 15mg  two times a day continue prozac 20 two times a day  denies homocidal or suicidal ideation see back in 6 mo  or prn The following medications were removed from the medication list:    Prozac 40 Mg Caps (Fluoxetine hcl) ..... One tab by mouth once daily    Buspar 15 Mg Tabs (Buspirone hcl) .Marland Kitchen... 1/2 two times a day  Her updated medication list for this problem includes:    Alprazolam 0.5 Mg Tabs (Alprazolam) ..... One at night    Buspar 15 Mg Tabs (Buspirone hcl) .Marland Kitchen... 1 two times a day for anxiety by mouth    Prozac 20 Mg Caps (Fluoxetine hcl) .Marland Kitchen... 1cap  two times a day for  depression   Complete Medication List: 1)  Alprazolam 0.5 Mg Tabs (Alprazolam) .... One at night 2)  Aviane 0.1-20 Mg-mcg Tabs (Levonorgestrel-ethinyl estrad) .... Take 1 tablet by mouth once a day 3)  Valtrex 1 Gm Tabs (Valacyclovir hcl) .Marland Kitchen.. 1 tablet two times a day as needed 4)  Tessalon Perles 100 Mg Caps (Benzonatate) .Marland Kitchen.. 1 every 8hrs as needed cough 5)  Buspar 15 Mg Tabs (Buspirone hcl) .Marland Kitchen.. 1 two times a day for anxiety by mouth 6)  Prozac 20 Mg Caps (Fluoxetine hcl) .Marland Kitchen.. 1cap  two times a day for depression   Patient Instructions: 1)  Please schedule a follow-up appointment in 6 months.    Prescriptions: BUSPAR 15 MG  TABS (BUSPIRONE HCL) 1 two times a day for anxiety by mouth  #60 x 6   Entered and Authorized by:   Gildardo Griffes FNP   Signed by:   Gildardo Griffes FNP on 07/30/2007   Method used:   Print then Give to Patient   RxID:   [id number] TESSALON PERLES 100 MG  CAPS (BENZONATATE) 1 every 8hrs as needed  cough  #90 x 2   Entered and Authorized by:   Gildardo Griffes FNP   Signed by:   Gildardo Griffes FNP on 07/30/2007   Method used:   Print then Give to Patient   RxID:   [id number]  ]

## 2010-05-11 NOTE — Progress Notes (Signed)
Summary: Request medication   Phone Note Call from Patient Call back at [phone]   Caller: Patient Call For: Dr. Hetty Ely Summary of Call: Pt's brother just passed away.  Pt is requesting something to help calm her down during the day and something to help her sleep during the night.  Pt. has been having bad dreams and has not slept in 2 nights. Pt. thinks she has been having panic attacks.  Pharmacy [facility]. Initial call taken by: Liane Comber,  October 22, 2006 12:48 PM  Follow-up for Phone Call        try Xanax 0.25mg  by mouth two times a day (first thing in AM and Just after Supper). # 60/RF 0       See me in two weeks. Follow-up by: Shaune Leeks MD,  October 22, 2006 1:10 PM  Additional Follow-up for Phone Call Additional follow up Details #1::        Rx Called In, PATIENT ADVISED ..................................................................Marland KitchenLiane Comber  October 22, 2006 2:17 PM

## 2010-05-11 NOTE — Assessment & Plan Note (Signed)
Summary: F/U  DEPRESSION/HEA  Medications Added PROZAC 20 MG  CAPS (FLUOXETINE HCL) 1 QD PROZAC 40 MG  CAPS (FLUOXETINE HCL) one tab by mouth once daily ALPRAZOLAM 0.25 MG  TABS (ALPRAZOLAM) 1 BID ALPRAZOLAM 0.5 MG  TABS (ALPRAZOLAM) 1/2 tab by mouth two times a day , one at night ZANTAC 75 75 MG TABS (RANITIDINE HCL) Take 2 tablet by mouth twice a day AVIANE 0.1-20 MG-MCG TABS (LEVONORGESTREL-ETHINYL ESTRAD) Take 1 tablet by mouth once a day      Allergies Added: ! * LATEX  Vital Signs:  Patient Profile:   25 Years Old Female Weight:      205 pounds Temp:     99 degrees F oral Pulse rate:   68 / minute Pulse rhythm:   regular BP sitting:   120 / 80  (left arm) Cuff size:   large  Vitals Entered By: Providence Crosby (October 30, 2006 12:40 PM)               Chief Complaint:  Depression.  History of Present Illness: Fractured something in R knee and had to stop working out ...should be released tommorrow and will start back with a trainer. "I'm going crazy."  Brother committed suicide last weekend. Lived with Mom Dad And pt's daughter. He was "adopted" part of family...someone whose father was Phylliss Blakes and he left his home and moved in with pt's family 63yrs ago.  Doing pretty well intellectually, involved mentally as her brother was her closest friend.  Current Allergies (reviewed today): ! * LATEX      Physical Exam  General:     Well-developed,well-nourished,in no acute distress; alert,appropriate and cooperative throughout examination Head:     Normocephalic and atraumatic without obvious abnormalities. No apparent alopecia or balding. Eyes:     Conjunctiva clear bilaterally.  Ears:     External ear exam shows no significant lesions or deformities.  Otoscopic examination reveals clear canals, tympanic membranes are intact bilaterally without bulging, retraction, inflammation or discharge. Hearing is grossly normal bilaterally. Nose:     External nasal examination shows  no deformity or inflammation. Nasal mucosa are pink and moist without lesions or exudates. Mouth:     Oral mucosa and oropharynx without lesions or exudates.  Teeth in good repair. Lungs:     Normal respiratory effort, chest expands symmetrically. Lungs are clear to auscultation, no crackles or wheezes. Heart:     Normal rate and regular rhythm. S1 and S2 normal without gallop, murmur, click, rub or other extra sounds. Psych:     Mildly flat affect but good expression.    Impression & Recommendations:  Problem # 1:  GRIEF REACTION (ICD-309.0) Assessment: New Increase Prozac and Xanax as directed...scripts written.  Medications Added to Medication List This Visit: 1)  Prozac 20 Mg Caps (Fluoxetine hcl) .Marland Kitchen.. 1 qd 2)  Prozac 40 Mg Caps (Fluoxetine hcl) .... One tab by mouth once daily 3)  Alprazolam 0.25 Mg Tabs (Alprazolam) .Marland Kitchen.. 1 bid 4)  Alprazolam 0.5 Mg Tabs (Alprazolam) .... 1/2 tab by mouth two times a day , one at night 5)  Zantac 75 75 Mg Tabs (Ranitidine hcl) .... Take 2 tablet by mouth twice a day 6)  Aviane 0.1-20 Mg-mcg Tabs (Levonorgestrel-ethinyl estrad) .... Take 1 tablet by mouth once a day   Patient Instructions: 1)  Please schedule a follow-up appointment in 1 month.    Prescriptions: ALPRAZOLAM 0.5 MG  TABS (ALPRAZOLAM) 1/2 tab by mouth two times a day ,  one at night  #60 x 2   Entered and Authorized by:   Shaune Leeks MD   Signed by:   Shaune Leeks MD on 10/30/2006   Method used:   Print then Give to Patient   RxID:   [id number] PROZAC 40 MG  CAPS (FLUOXETINE HCL) one tab by mouth once daily  #30 x prn   Entered and Authorized by:   Shaune Leeks MD   Signed by:   Shaune Leeks MD on 10/30/2006   Method used:   Print then Give to Patient   RxID:   [id number]

## 2010-05-11 NOTE — Letter (Signed)
Summary: [facility] APPT DUE TO INSURANCE  ALRM - [facility]C APPT DUE TO INSURANCE   Imported By: Carin Primrose 02/20/2008 09:46:11  _____________________________________________________________________  External Attachment:    Type:   Image     Comment:   External Document

## 2010-05-24 ENCOUNTER — Ambulatory Visit: Payer: Self-pay | Admitting: Urology

## 2010-07-12 LAB — URINALYSIS, ROUTINE W REFLEX MICROSCOPIC
Bilirubin Urine: NEGATIVE
Glucose, UA: NEGATIVE mg/dL
Hgb urine dipstick: NEGATIVE
Ketones, ur: NEGATIVE mg/dL
Nitrite: NEGATIVE
Protein, ur: NEGATIVE mg/dL
Specific Gravity, Urine: >1.03 — ABNORMAL HIGH (ref 1.005–1.030)
Urobilinogen, UA: 0.2 mg/dL (ref 0.0–1.0)
pH: 6 (ref 5.0–8.0)

## 2010-07-12 LAB — GC/CHLAMYDIA PROBE AMP, GENITAL
Chlamydia, DNA Probe: NEGATIVE
GC Probe Amp, Genital: NEGATIVE

## 2010-07-12 LAB — WET PREP, GENITAL
Trich, Wet Prep: NONE SEEN
Yeast Wet Prep HPF POC: NONE SEEN

## 2010-07-18 ENCOUNTER — Ambulatory Visit: Payer: Self-pay | Admitting: Urology

## 2010-07-24 LAB — BASIC METABOLIC PANEL
BUN: 4 mg/dL — ABNORMAL LOW (ref 6–23)
CO2: 23 mEq/L (ref 19–32)
Calcium: 8.3 mg/dL — ABNORMAL LOW (ref 8.4–10.5)
Chloride: 104 mEq/L (ref 96–112)
Creatinine, Ser: 0.83 mg/dL (ref 0.4–1.2)
GFR calc Af Amer: >60 mL/min (ref >60)
GFR calc non Af Amer: >60 mL/min (ref >60)
Glucose, Bld: 102 mg/dL — ABNORMAL HIGH (ref 70–99)
Potassium: 3.7 mEq/L (ref 3.5–5.1)
Sodium: 134 mEq/L — ABNORMAL LOW (ref 135–145)

## 2010-08-22 NOTE — Discharge Summary (Signed)
NAMEGLADYS, DECKARD              ACCOUNT NO.:  [id number]   MEDICAL RECORD NO.:  [id number]          PATIENT TYPE:  INP   LOCATION:  3010                         FACILITY:  [facility]   PHYSICIAN:  Charlestine Massed, MDDATE OF BIRTH:  01-Jan-1986   DATE OF ADMISSION:  04/07/2008  DATE OF DISCHARGE:  04/10/2008                               DISCHARGE SUMMARY   PRIMARY CARE PHYSICIAN:  Dr. Milton Ferguson.  She is planning to follow up  with the [facility] in [place].   GYNECOLOGIST:  Dr. Guy Sandifer. Tomblin.   UROLOGIST:  Dr. Windy Fast L. Davis.   REASON FOR ADMISSION:  Severe abdominal pain and severe constipation.   HOSPITAL COURSE:  Ms. Aleina Burgio was admitted with severe abdominal  pain.  She has a prior history of interstitial cystitis, cervical  dysplasia, renal calculi and she is status post oophorectomy,  tonsillectomy and a dilatation and curettage in 2003, for retained  placenta after the C-section.  She also had a cystoscopy in 2006, for  the evaluation of cystitis.   She came in with complaints of severe pain on the right side, right side  on the back.  Clinically she was found to have costovertebral angle  tenderness suggestive of pyelonephritis.  She had a urinalysis which was  positive for a urinary tract infection.  She was started on ceftriaxone.  She received four days of ceftriaxone.  She also had severe constipation  on admission and was given GoLYTELY, following which the constipation  was relieved.  Today the patient is asymptomatic.  She has been  asymptomatic for more than 12 hours now.  She has been afebrile for more  than 36 hours.  Clinically electrolytes are stable now.   So the patient is being discharged.  She was on ceftriaxone and is being  switched to Levaquin for the next three days.  She received a total of  three days of ceftriaxone.  She will continue the Levaquin for another  three days.  She is being discharge.   DISCHARGE DIAGNOSIS:   Right-sided pyelonephritis.   ADDITIONAL PRIOR DIAGNOSES:  1. Cervical dysplasia.  2. Possible interstitial cystitis.   DISCHARGE MEDICATIONS:  Levaquin 250 mg p.o. daily for three days.   PROCEDURES DONE ON THIS ADMISSION:  Ultrasound of the kidney and  bladder, to rule out hydronephrosis was done.  There was no evidence of  hydronephrosis.  No renal calculi.  No other sexual deformities seen.   DISPOSITION:  This patient is discharged back to home.   FOLLOWUP:  1. Follow up with [facility] for primary care.  2. Follow up with Dr. Guy Sandifer. Tomblin for gynecology.  3. Follow up with Dr. Gaynelle Arabian for urology.   The time spent was a total of 25 minutes spent on this discharge.      Charlestine Massed, MD  Electronically Signed     UT/MEDQ  D:  04/10/2008  T:  04/10/2008  Job:  [id number]   cc:   Smith Mince. Henderson Cloud, M.D.  Ronald L. Earlene Plater, M.D.

## 2010-08-22 NOTE — H&P (Signed)
NAMEROMIE, KEEBLE              ACCOUNT NO.:  [id number]   MEDICAL RECORD NO.:  [id number]          PATIENT TYPE:  POB   LOCATION:  WSC                          FACILITY:  [facility]   PHYSICIAN:  Lonia Blood, M.D.DATE OF BIRTH:  March 04, 1986   DATE OF ADMISSION:  04/08/2008  DATE OF DISCHARGE:                              HISTORY & PHYSICAL   PRIMARY CARE PHYSICIAN:  Unassigned.   GYNECOLOGIST:  Dr. Huntley Dec   UROLOGIST:  Dr. Earlene Plater   CHIEF COMPLAINT:  Abdominal pain with severe constipation.   HISTORY OF PRESENT ILLNESS:  Ms. [name] is a 25 year old female  with extensive history to include interstitial cystitis, cervical  dysplasia, nephrolithiasis and chronic abdominal/pelvic pain.  She  reports an approximately 2-3 week history of intermittent CVA region  pain with severe burning urination and fire-like pain in her abdomen  and radiating around her flank into her pelvic region.  She denies  vaginal discharge.  She denies change in her usual menstrual habits.  She has had tubal ligation.  Nonetheless, has a negative urine pregnancy  test in the ER as well.  She reports subjective fevers and chills at  home for approximately 3-4 days.  She denies a bowel movement for  greater than a week now.  The patient was seen for the same symptoms in  [facility] via the emergency room last night.  At that time, CT scan of  the abdomen was accomplished, and a general surgery evaluation was  carried out.  There was a question initially of a small-bowel  obstruction, but this was not felt to be the case ultimately, and the  patient was able to be discharged home.  The patient still has not had a  bowel movement, however, despite ongoing use of MiraLax as well as a  dose of magnesium citrate in the emergency room.   REVIEW OF SYSTEMS:  Comprehensive review of systems is unremarkable with  the exception of the positive elements noted in history of present  illness above.   PAST  MEDICAL HISTORY:  1. Possible interstitial cystitis with initial evaluation November      2006 including cystoscopy.  2. Status post C-section December 2003.  3. Status post oophorectomy.  4. Status post tonsillectomy.  5. Right knee arthroscopy with resection of medial plica October 2008.  6. History of cervical dysplasia.  Full evaluation, specifics unclear.  7. History of nephrolithiasis.  8. D&C in 2003 for retained placenta.   OUTPATIENT MEDICATIONS:  None.   ALLERGIES:  LATEX CAUSING SEVERE CONTACT DERMATITIS.   FAMILY HISTORY:  Noncontributory this admission.   SOCIAL HISTORY:  The patient does not smoke.  She does not drink  alcohol.  She lives in [place].  She does not presently have  insurance.  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   DATA REVIEW:  Urinalysis reveals moderate leukocyte esterase, 15  ketones, 100 protein, large hemoglobin and 21-50 white blood cells with  red blood cells too numerous to count.  Wet-prep was negative.  Urine  pregnancy test is negative.  CBC is  actually unremarkable.  B-met is  unremarkable with exception of potassium borderline low at 3.6.  KUB  reveals no acute findings.   PHYSICAL EXAMINATION:  VITAL SIGNS:  Temperature 103, blood pressure  130/83, heart rate 101, respiratory 18, O2 sat 96% room air.  GENERAL:  Obese female in no acute respiratory distress.  LUNGS:  Clear to auscultation bilaterally without wheezes or rhonchi.  CARDIOVASCULAR:  Regular rate and rhythm without murmur, gallop or rub.  Normal S1-S2.  ABDOMEN:  Obese, soft, bowel sounds are remarkably hypoactive.  The  patient is tender to deep palpation in all four quadrants, but there is  no focal tenderness and no rebound.  There is CVA tenderness  bilaterally.  EXTREMITIES:  No significant cyanosis, clubbing, edema bilateral lower  extremities.  GU:  Full pelvic exam was performed by the EDP, and is reportedly  unremarkable.  Please see her  documentation for questionable  discoloration on the region of the cervix.  NEUROLOGIC:  Nonfocal neurologic exam.   IMPRESSION AND PLAN:  1. Pyelonephritis.  The patient will be treated empirically with      Rocephin.  Urine will be sent for culture.  Antibiotics will be      further titrated based upon the results of the urine culture.  2. Severe constipation.  We will begin treatment with one bottle of      magnesium citrate.  If this does not resolve the patient's symptoms      within a couple of hours, we will initiate 8 ounces of Golytely      every 10 minutes and continue this until either 4 liters is      consumed or the patient has had a large bowel movement.  If the      patient does not have her responsive with Golytely, we will then      began treatment with enemas.  We will restrict the patient to clear      liquids until she is moving her bowels.  We will supplement her      volume status with normal saline IV fluid.  3. History of nephrolithiasis.  There is no evidence of kidney stones      on KUB.  4. Borderline hypokalemia.  In the current setting, we certainly would      wish for the patient's potassium to be at 4.0 or greater.  We will      add potassium to her IV fluid and follow her potassium trend.      Lonia Blood, M.D.  Electronically Signed     JTM/MEDQ  D:  04/08/2008  T:  04/08/2008  Job:  [id number]

## 2010-08-22 NOTE — Op Note (Signed)
NAMEREMMIE, BEMBENEK              ACCOUNT NO.:  [id number]   MEDICAL RECORD NO.:  [id number]          PATIENT TYPE:  OIB   LOCATION:  2550                         FACILITY:  [facility]   PHYSICIAN:  Vanita Panda. Magnus Ivan, M.D.DATE OF BIRTH:  1985-11-20   DATE OF PROCEDURE:  01/09/2007  DATE OF DISCHARGE:                               OPERATIVE REPORT   PREOPERATIVE DIAGNOSIS:  Continued right knee pain and questionable  internal derangement.   POSTOPERATIVE DIAGNOSIS:  Large medial plica with inflammation and  synovitis.   PROCEDURE:  Right knee arthroscopy with debridement and resection of  medial plica.   SURGEON:  Vanita Panda. Magnus Ivan, M.D.   ANESTHESIA:  General.   ANTIBIOTICS:  1 gram IV Ancef and 400 mg IV Cipro due to UTI.   BLOOD LOSS:  Minimal.   COMPLICATIONS:  None.   INDICATIONS:  Briefly, Ethelyn is a 25 year old female who I have been  following for the last several months due to pain in her right knee.  She is a mildly obese individual but has become very active especially  with her exercise routines at a gym and with a personal trainer.  She  has done a lot of kicking-type of activities and is really working hard  to get into shape.  With these activities, she started developing  excruciating right knee pain and had an incident a couple of months ago  where she was having difficulty even putting weight on her knee.  An MRI  was obtained at that time and did not show any torn cartilage but did  show suspicious bone bruising in the posterior plateau.  After physical  therapy and injection in her knee, this pain did resolve for awhile.  As  she started to get back into activities,  the pain did recur and started  to get worse with activities.  She had failed at least two injections of  nonsteroidals, and so we agreed to proceed with operative intervention  with a diagnostic and hopefully therapeutic arthroscopy.  The risks and  benefits of surgery were  explained to her and well-understood, and she  agreed to proceed with surgery.   DESCRIPTION OF PROCEDURE:  After informed consent was obtained and the  appropriate right leg was marked, she was brought to the operating room  and placed supine on the operating table.  A nonsterile tourniquet was  placed around her upper thigh but was never utilized during the case.  Her  leg was prepped and draped with DuraPrep and sterile drapes.  General anesthesia was obtained.  A timeout was called.  She was  identified as the correct patient and the correct extremity.   With the knee flexed of the side of the table and the bed raised and a  lateral leg post in place, an anterolateral portal was then made just  lateral to the patellar tendon, and an arthroscopic cannula was  inserted.  There was no effusion encountered.  I did put in the camera  and on assessing the patellofemoral space in the superior compartment, I  did find a large medial plica with  inflamed tissue on the medial side of  the knee as the large plica came down across the femoral condyle.  I  then assessed the medial compartment and found the cartilage on the  medial femoral condyle and medial tibial plateau intact.  The medial  meniscus was probed and found to be intact.  The ACL was intact  throughout its flexion and extension arc, and then the lateral  compartment was assessed with the knee in the figure 4 position.  The  lateral meniscus was probed and found to be intact and the cartilage was  intact.  Even the cartilage under the patella was intact as well.  An  anteromedial portal was then made just medial to the patellar tendon,  and an arthroscopic shaver was inserted.  I was able to carry out a  thorough debridement and resection of this plica as well as debridement  of the inflamed tissue along the medial femoral condyle.  All  instrumentation was then removed, and I allowed fluid to lavage through  the knee.  I then  removed the effusion from the knee and inserted a  mixture of 8 mg of morphine and Marcaine with epinephrine into the knee  itself and infiltrated the portal sites.  The portal sites were closed  with interrupted 4-0 nylon.  Xeroform followed by a well-padded sterile  dressing and Ace wraps were placed on the knee as well as an ice pack.   The patient was awakened, extubated, and taken to the recovery room in  stable condition.  All final counts were correct, and there no  complications noted.      Vanita Panda. Magnus Ivan, M.D.  Electronically Signed     CYB/MEDQ  D:  01/09/2007  T:  01/09/2007  Job:  [id number]

## 2010-08-25 NOTE — Op Note (Signed)
NAMESEVANNAH, MADIA                        ACCOUNT NO.:  [id number]   MEDICAL RECORD NO.:  [id number]                   PATIENT TYPE:  INP   LOCATION:  9147                                 [facility]   PHYSICIAN:  Gerrit Friends. Aldona Bar, M.D.                DATE OF BIRTH:  1985/08/27   DATE OF PROCEDURE:  03/29/2002  DATE OF DISCHARGE:                                 OPERATIVE REPORT   PREOPERATIVE DIAGNOSES:  1. Status post cesarean section for delivery on March 24, 2002.  2. Persistent fever.  3. Possible retained products.   POSTOPERATIVE DIAGNOSES:  1. Status post cesarean section for delivery on March 24, 2002.  2. Persistent fever.  3. Possible retained products, pathology pending.   PROCEDURES:  Suction dilatation and curettage with paracervical block - 20  cc of 1% lidocaine without epinephrine.   SURGEON:  Gerrit Friends. Aldona Bar, M.D.   HISTORY:  This 25 year old patient underwent a cesarean section on the  evening of December 16 for failure to progress.  Postoperatively she  developed fever and was first placed on Mefoxin which was later changed to  Unasyn.  In spite of aggressive antibiotic therapy, she continued to spike  fevers and have an elevated white count.  On December 21, an ultrasound was  done which suggested echogenic material within the lower uterine segment  consistent with blood clot or retained products of conception.  No other  abnormalities were noted included an evaluation of both kidneys on  ultrasound and evaluation of the area around the uterus looking for fluid  pockets or hematomas.   She is now taken to the operating room for suction dilatation and curettage  with a preoperative diagnosis of possible retained products of conception.   PROCEDURE:  The patient was taken to the operating room where she was  prepped and draped having been placed in the supine position - in the short  Allen stirrups - modified lithotomy.  She was prepped and  draped.  At this  time, speculum was placed in the vagina, single-tooth tenaculum placed on  the anterior lip of the cervix, and a paracervical block carried out by the  surgeon using 1% Xylocaine - 20 cc.  After an adequate wait, it was  determined that the internal os was dilated beyond a 27 Pratt dilator.  With  ease the 9 suction curette was placed into the cavity and after appropriate  testing suction was applied and a moderate amount of ? clot, ? tissue was  obtained, the curettage with the suction and the large standard was carried  out in a thorough, gentle, and systematically fashion until felt to be  complete.  All specimen was sent to pathology, appropriately labeled.   At this time, the procedure was completed and all instruments were removed.  The patient at this time  was transported to the recovery room in  satisfactory condition  and all preoperative orders, including the continued  use of Unasyn IV, will be continued.  Condition on arrival in the recovery  room satisfactory.                                               Gerrit Friends. Aldona Bar, M.D.    RMW/MEDQ  D:  03/29/2002  T:  03/30/2002  Job:  [id number]

## 2010-08-25 NOTE — H&P (Signed)
NAMEJAHNYA, [name]              ACCOUNT NO.:  [id number]   MEDICAL RECORD NO.:  [id number]          PATIENT TYPE:  AMB   LOCATION:  SDC                           [facility]   PHYSICIAN:  Guy Sandifer. Henderson Cloud, M.D. DATE OF BIRTH:  December 20, 1985   DATE OF ADMISSION:  DATE OF DISCHARGE:                                HISTORY & PHYSICAL   DATE OF ADMISSION:  December 06, 2004   CHIEF COMPLAINT:  Pelvic pain.   HISTORY OF PRESENT ILLNESS:  This patient is a 25 year old single white  female G1 P1 with a long history of irregular menses. She also has a long  history of pelvic pain, especially on the right. A CAT scan obtained by her  urologist to investigate possible recurrent kidney stones revealed a right  ovarian cyst. A follow-up ultrasound at my office on October 26, 2004 revealed  a uterus measuring 7.6 x 4.0 x 4.7 cm with normal ovaries. The patient has  achieved better cycle control with the NuvaRing. However, she continues to  have pelvic pain. She is being admitted for laparoscopy. Potential risks and  complications have been discussed preoperatively.   PAST MEDICAL HISTORY:  1.  Kidney stones.  2.  UTIs.  3.  Headache.  4.  Anemia.   PAST SURGICAL HISTORY:  1.  Tonsillectomy.  2.  D&C 2003 for retained placenta.   FAMILY HISTORY:  Negative.   MEDICATIONS:  NuvaRing daily, Darvocet p.r.n.   ALLERGIES:  LATEX, leading to severe contact rash. The patient denies  shortness of breath.   REVIEW OF SYSTEMS:  NEURO:  History of headache as above. CARDIO:  Denies  chest pain. PULMONARY:  Denies shortness of breath. GI:  Denies recent  changes in bowel habits.   PHYSICAL EXAMINATION:  VITAL SIGNS:  Height 5 feet 1.5 inches, weight 195  pounds. Blood pressure 120/80.  HEENT:  Without thyromegaly.  LUNGS:  Clear to auscultation.  HEART:  Regular rate and rhythm.  BACK:  Without CVA tenderness.  BREASTS:  Without mass, retraction, or discharge.  ABDOMEN:  Obese, soft, nontender,  without palpable masses.  PELVIC:  Vulva, vagina, and cervix without lesions. Uterus normal size,  mobile, nontender. Left adnexa nontender without masses. Right adnexa mildly  tender without palpable masses.  EXTREMITIES:  Grossly within normal limits.  NEUROLOGIC:  Grossly within normal limits.   ASSESSMENT:  Pelvic pain.   PLAN:  Laparoscopy.      Guy Sandifer Henderson Cloud, M.D.  Electronically Signed     JET/MEDQ  D:  11/29/2004  T:  11/29/2004  Job:  [id number]

## 2010-08-25 NOTE — Op Note (Signed)
[name], [name]              ACCOUNT NO.:  [id number]   MEDICAL RECORD NO.:  [id number]          PATIENT TYPE:  AMB   LOCATION:  SDC                           [facility]   PHYSICIAN:  Guy Sandifer. Henderson Cloud, M.D. DATE OF BIRTH:  Oct 03, 1985   DATE OF PROCEDURE:  12/06/2004  DATE OF DISCHARGE:                                 OPERATIVE REPORT   PREOPERATIVE DIAGNOSIS:  Pelvic pain.   POSTOPERATIVE DIAGNOSIS:  Pelvic pain.   PROCEDURE:  Laparoscopy with biopsy of right pelvic sidewall.   SURGEON:  Guy Sandifer. Henderson Cloud, M.D.   ANESTHESIA:  General with endotracheal intubation.   SPECIMENS:  Biopsy of right pelvic right pelvic sidewall peritoneum.   ESTIMATED BLOOD LOSS:  Drops.   INDICATIONS AND CONSENT:  This patient is a 25 year old single white female,  G1, P1, with a long history of irregular menses and pelvic pain.  Details  are dictated in the history and physical.  Laparoscopy has been discussed  with the patient preoperatively.  The potential risks and complications are  reviewed preoperatively, including but not limited to infection, bowel,  bladder or ureteral damage; bleeding requiring transfusion of blood products  with possible transfusion reaction, HIV and hepatitis acquisition; DVT, PE,  pneumonia, recurrent pain.  All questions were answered and consent is  signed on the chart.   FINDINGS:  Upper abdomen is grossly normal.  The appendix is normal.  The  uterus is normal in size and contour.  Anterior and posterior cul-de-sacs are normal.  Tubes and ovaries are normal  bilaterally.  Left pelvic sidewall is normal.  Right pelvic sidewall has a  single red implant possibly consistent with endometriosis, approximately 3  mm in size, that is essentially in the proximal one-third of the right  uterosacral ligament.   PROCEDURE:  The patient is taken to the operating room, where she is  identified, placed in the dorsal supine position, and general anesthesia is  induced via endotracheal intubation.  She is then placed in the dorsal  lithotomy position, where she is prepped abdominally and vaginally, the  bladder straight catheterized and a Hulka tenaculum is placed in the uterus  as a manipulator, and she is draped in a sterile fashion.  This is done with  latex-free equipment.  A small infraumbilical incision is made and a  disposable Veress needle is placed in the peritoneal cavity.  The syringe  and drop test are normal.  Two liters of gas are insufflated under low  pressure with good tympany in the right upper quadrant.  The Veress needle  is then removed and a 10/11 XL disposable bladeless trocar sleeve is then  placed using the direct visualization method using the laparoscope.  Careful  inspection reveals no damage to surrounding structures.  A small suprapubic  incision is made and a 5 mm disposable XL bladeless trocar sleeve is placed  under direct visualization without difficulty.  The above findings were  noted.  The implant of the uterosacral ligament, which was seen to be well  clear of the ureter, is then grasped, elevating the peritoneum.  Peritoneal biopsy was then performed.  The specimen is sent to pathology.  Minor cautery at the peritoneal edges is used to assure hemostasis.  Inspection under reduced pneumoperitoneum reveals complete hemostasis.  Suprapubic trocar sleeve is removed.  Pneumoperitoneum is completely  reduced, the umbilical trocar sleeve is removed.  Vicryl 2-0 suture was used  to reapproximate the subcutaneous tissue in the umbilical incision.  Both  incisions are injected with 0.5% plain Marcaine.  Dermabond is applied to the incisions.  It should be noted that the NuvaRing  is removed during the vaginal prep.  The Hulka tenaculum is removed.  Good  hemostasis is noted.  All counts are correct.  The patient is awakened, taken to the recovery room in stable condition.      Guy Sandifer Henderson Cloud, M.D.   Electronically Signed     JET/MEDQ  D:  12/06/2004  T:  12/06/2004  Job:  [id number]

## 2010-08-25 NOTE — Consult Note (Signed)
[facility]  Patient:    NGINA, ROYER Visit Number: [id number] MRN: [id number]          Service Type: OBS Location: 9300 [id number] Attending Physician:  Miguel Aschoff Dictated by:   Veverly Fells Vernie Ammons, M.D. Consultation Date: 10/11/01 Admit Date:  10/10/2001 Discharge Date: 10/11/2001   CC:         Miguel Aschoff, M.D.   Consultation Report  HISTORY:                      The patient is a 25 year old white female who is G1, P0, and approximately [redacted] weeks pregnant.  On Thursday of this week she began to have a little bit of mild right lower quadrant pain.  That then progressed to right flank pain.  She presented as an outpatient for ultrasound which revealed right hydro and several stones in her right kidney ranging from 2.5-4.5 mm in size.  She has a history of stones having passed two stones previously spontaneously and reports the stones she has passed were larger than that.  She has some mild irritative symptoms of frequency and urgency. She denies fever, chills, or vomiting.  She has had some mild nausea.  She has not had any hematuria.  PAST MEDICAL HISTORY:         She has history of migraines.  PAST SURGICAL HISTORY:        None.  MEDICATIONS:                  Fioricet, some multivitamins, Demerol, Phenergan.  ALLERGIES:                    She has no known drug allergies, but has LATEX allergy.  FAMILY HISTORY:               There is no history of GU malignancy or renal disease in the family.  REVIEW OF SYSTEMS:            Positive for normal bowel function, otherwise negative other than noted above.  PHYSICAL EXAMINATION  VITAL SIGNS:                  Temperature 97.3, pulse 76, respirations 20, blood pressure 110/70.  GENERAL:                      The patient is a well-developed, well-nourished white female in no apparent distress at this time.  HEENT:                        Atraumatic, normocephalic.  PERRLA.  EOMI. Oropharynx  is clear.  NECK:                         Supple without JVD or adenopathy.  LUNGS:                        Clear.  CARDIOVASCULAR:               Regular rate and rhythm.  BREASTS:                      Not examined.  ABDOMEN:                      Soft, gravid.  Uterus palpable.  No peritoneal signs or  tenderness to palpation.  She did have mild right CVA tenderness to percussion.  PELVIC:                       She has normal external female genitalia. Vaginal examination was not performed.  EXTREMITIES:                  Without clubbing, cyanosis, edema.  SKIN:                         Warm and dry without lesion.  LABORATORY RESULTS:           White count 8.6, H&H 13.1 and 37.7, 280,000 platelets.  On July 3 urinalysis had no leukocyte esterase or nitrites and no blood.  IMPRESSION:                   1. Right renal calculi by ultrasound with a                                  history of two passed calculi in the past.                               2. Mild right hydro and right renal colic.  She                                  is likely passing a stone.  If the stone size                                  is similar to those in the kidney she should                                  be able to pass this spontaneously,                                  especially with the normal physiologic                                  dilatation that occurs in the right ureter                                  with pregnancy.  If further pain continues                                  then I would either stent her or more likely                                  ureteroscope out the stone and stent her  temporarily.  PLAN:                         1. Will allow patient to be discharged home.                               2. Prescription for Demerol #36 and no refills.                               3. She will follow up in my office next week for                                   repeat ultrasound and reevaluation. Dictated by:   Veverly Fells Vernie Ammons, M.D. Attending Physician:  Miguel Aschoff DD:  10/11/01 TD:  10/14/01 Job: [id number]

## 2010-08-25 NOTE — Op Note (Signed)
[name], [name]                        ACCOUNT NO.:  [id number]   MEDICAL RECORD NO.:  [id number]                   PATIENT TYPE:  OIB   LOCATION:  2550                                 FACILITY:  [facility]   PHYSICIAN:  Kathy Breach, M.D.                   DATE OF BIRTH:  11/22/1985   DATE OF PROCEDURE:  11/08/2003  DATE OF DISCHARGE:                                 OPERATIVE REPORT   PREOPERATIVE DIAGNOSIS:  Chronic and recurrent adenotonsillitis.   POSTOPERATIVE DIAGNOSIS:  Chronic and recurrent adenotonsillitis.   PROCEDURE:  Adenotonsillectomy.   DESCRIPTION OF PROCEDURE:  With the patient under general orotracheal  anesthesia, Crowe-Davis mouth gag inserted and the patient put in a Rose  position.  Inspection of oral cavity revealed normal intact soft palate.  Tonsils were irregular at the face of the tonsillar fossae bilaterally.  Nasopharyngeal mirror examination revealed significant adenoid tissue  present.  Left tonsil is grasped.  Pole removed by electrical dissection,  maintained complete hemostasis by electrocautery.  Right tonsil removed in  similar fashion.  Red rubber catheter was then placed through left nasal  chamber and used to elevate the soft palate.  Adenoid tissue was ablated  with suction cautery maintaining complete hemostasis.  Blood loss during the  procedure was less than 5 mL.  The patient tolerated the procedure well and  was taken to the recovery room in stable general condition.                                               Kathy Breach, M.D.    Venia Minks  D:  11/08/2003  T:  11/08/2003  Job:  [id number]

## 2010-08-25 NOTE — Discharge Summary (Signed)
[name], [name]                        ACCOUNT NO.:  [id number]   MEDICAL RECORD NO.:  [id number]                   PATIENT TYPE:  INP   LOCATION:  9110                                 FACILITY:  [facility]   PHYSICIAN:  Malva Limes, M.D.                 DATE OF BIRTH:  1985-04-15   DATE OF ADMISSION:  03/24/2002  DATE OF DISCHARGE:  04/05/2002                                 DISCHARGE SUMMARY   FINAL DIAGNOSES:  1. Intrauterine pregnancy at [redacted] weeks gestation for induction.  2. Arrest of the active phase of labor.  3. Delivery of a 7 pound 15 ounce female infant with Apgars of 9 and 9.  4. Postoperative endometritis.  5. Postoperative anemia.  6. Latex allergy.  7. Postoperative transfusion.  8. Persistent postoperative fever with questionable etiology.   PROCEDURE:  1. Primary low transverse cesarean section performed on March 24, 2002 by     Carrington Clamp, M.D., assistant Conni Elliot, M.D.  2. Suction dilatation and curettage with paracervical block performed by     Gerrit Friends. Aldona Bar, M.D. on December 21.   HOSPITAL COURSE:  This 25 year old G1, P0 presents at 41 weeks for induction  with favorable cervix.  The patient had arrest of the active stage of labor  and was taken to the operating room on March 24, 2002 by Carrington Clamp, M.D. where a primary low transverse cesarean section was performed  with delivery of a 7 pound 15 ounce female infant with Apgars of 9 and 9.  The  delivery went without complications except for postoperatively patient began  to have some elevated temperatures, chills.  She was started on Cefotan and  was started on some iron for postoperative anemia.  She had a hemoglobin of  7.1 on postoperative day number one.  The patient was continued on her  antibiotics.  By postoperative day number three her hemoglobin was down to  6.6 and she was feeling very tired and weak.  The patient continued to spike  temperatures on Cefotan and at this  point was started on IV Unasyn.  She  continued to be tender and at this point ultrasound was performed to rule  out any obstruction regarding her ovaries.  On December 21 it was felt that  patient should be taken to the operating room for suction D&C to rule out  any questionable retained products of conception or any clots.  The patient  continued to have pain in her right lower abdomen and flank.  At this point  white blood cell count was 10.7.  Hemoglobin was 6.4.  She was stable but  she continued to have this pain on the right side throughout her hospital  stay.  By postoperative day number six questionable diagnosis of a septic  pelvic thrombophlebitis.  At this point patient was started on heparin per  protocol.  The patient continued to  spike fevers and at this point felt like  a blood transfusion should be performed.  She was not having any response to  the IV antibiotics.  A chest x-ray revealed interstitial pulmonary edema.  Oxygen saturations were 94%.  The patient was given some Lasix.  At this  point a consult with Tinnie Gens C. Ninetta Lights, M.D. of infectious disease was  called upon to evaluate antibiotics regimen.  Lacretia Leigh. Ninetta Lights, M.D.  changed antibiotics to clindamycin and aztreonam.  His impression was  probably a pelvic septic thrombophlebitis.  The patient did become afebrile  on her new antibiotic regimen and by postoperative day number 10 was not  having any complaints.  Continued to be afebrile.  Continued IV treatment  and infectious disease continued to check in on the patient as well.  By  postoperative day number 11 patient was still doing fine, afebrile for 28  hours.  On the 27th patient did have an episode of shortness of breath  overnight.  She continued to be afebrile during this time.  Infectious  disease recommended we change her to oral Levaquin today for at least seven  days and watch closely.  We stopped the anticoagulation at this point and  patient was  felt ready for discharge on December 28.  The patient still had  a wound with a 2-3 cm small opening.  It was not infected.  There was small  induration.  She was sent home on Levaquin 750 mg daily for six more days.  To clean wound with hydrogen peroxide.  She will return to the office on  Tuesday to be watched closely in the office.  Will call with any increased  fevers.  Continue prenatal vitamins and iron and call with any fevers or  increased pain.   LABORATORIES:  On discharge patient's hemoglobin was back up to 8.9, white  blood cell count 14.2.     Leilani Able, P.A.-C.                Malva Limes, M.D.    MB/MEDQ  D:  06/04/2002  T:  06/04/2002  Job:  [id number]

## 2010-08-25 NOTE — Op Note (Signed)
[name], [name]              ACCOUNT NO.:  [id number]   MEDICAL RECORD NO.:  [id number]          PATIENT TYPE:  AMB   LOCATION:  [facility]                         FACILITY:  [facility]   PHYSICIAN:  Ronald L. Earlene Plater, M.D.  DATE OF BIRTH:  08/11/1985   DATE OF PROCEDURE:  02/16/2005  DATE OF DISCHARGE:                                 OPERATIVE REPORT   DIAGNOSIS:  Questionable interstitial cystitis.   OPERATIVE PROCEDURE:  1.  Cystourethroscopy.  2.  Hydraulic bladder distention.  3.  Bladder biopsy.   SURGEON:  Dr. Gaynelle Arabian   ANESTHESIA:  LMA.   ESTIMATED BLOOD LOSS:  Negligible.   TUBES:  None.   COMPLICATIONS:  None.   FINDINGS:  600 mL capacity at 80 cm of water pressure with glomerulations  noted.   INDICATION FOR PROCEDURE:  Ms. [name] is a lovely 25 year old white female,  basically has had low back pain on and off for some time.  She has pain with  urination.  She has had some nausea, vomiting, and dysuria.  She has had an  ovarian cyst which is being evaluated by her gynecologist and also has  cervical dysplasia which is being worked up.  She has also had a  laparoscopic evaluation previously by gynecology and has undergone a CT  abdomen and pelvis which really showed no significant abnormalities other  than a small ovarian cyst.  After understanding risks, benefits, and  alternatives, she elected to proceed with the above procedure for both  diagnostic and therapeutic reasons.   PROCEDURE IN DETAIL:  The patient was placed in a supine position.  After  proper LMA anesthesia, was placed in the dorsolithotomy position, prepped  and draped with Betadine in a sterile fashion.  Cystourethroscopy was  performed with a 22.5 French Olympus panendoscope utilizing the 12 and 70-  degree lenses.  The bladder was carefully inspected, noted to be without  lesions.  Efflux of clear urine was noted from the normally placed ureteral  orifices bilaterally.  Hydraulic bladder  distention was then performed to 80  cm of water pressure.  The total capacity was 600 mL and when the fluid was  removed, significant glomerulations were noted throughout the bladder,  consistent with interstitial cystitis.  Biopsies taken from the posterior  midline and submitted to pathology.  The base was cauterized with Bovie  coagulation cautery.  No other lesions were noted to be present.  The  bladder was drained; the panendoscope was removed, and the patient was taken  to the recovery room stable.      Ronald L. Earlene Plater, M.D.  Electronically Signed     RLD/MEDQ  D:  02/16/2005  T:  02/16/2005  Job:  [id number]

## 2010-08-25 NOTE — Op Note (Signed)
[facility]  Patient:    [name], [name] Visit Number: [id number] MRN: [id number]          Service Type: NES Location: [facility] Attending Physician:  Trisha Mangle Dictated by:   Veverly Fells Vernie Ammons, M.D. Proc. Date: 10/31/01 Admit Date:  10/31/2001                             Operative Report  PREOPERATIVE DIAGNOSIS:  Right hydronephrosis.  POSTOPERATIVE DIAGNOSIS:  Right hydronephrosis.  PROCEDURES: 1. Cystoscopy. 2. Right ureteroscopy. 3. Double J stent placement.  SURGEON:  Mark C. Vernie Ammons, M.D.  ANESTHESIA:  Spinal.  DRAINS:  A 4.5 French 24 cm double J stent in the right ureter.  ESTIMATED BLOOD LOSS:  Less than 5 cc.  SPECIMENS:  None.  COMPLICATIONS:  None.  INDICATION:  The patient is a 25 year old white female who is [redacted] weeks pregnant and who has a history of kidney stones.  She developed right flank pain and ended up having to be admitted to the hospital for pain control, and an ultrasound was obtained that revealed right hydronephrosis.  She was followed conservatively because the small stones up in her kidney were felt to be most likely representative of the size of the stone in her ureter, and eventually she continued to demonstrate hydro on ultrasound so I scheduled her for surgery.  Just prior to that she passed a small stone and I cancelled her surgery but I had her come in for a repeat ultrasound, and it showed persistent hydronephrosis.  The patient has been having some intermittent right-sided pain, nothing severe, and then recently that pain has resolved, but I repeated her ultrasound this morning and she continues to have right hydronephrosis.  She is brought to the OR today for ureteroscopic evaluation of this and possible stone extraction with stent placement.  She understands the risks, complications, alternatives, and limitations.  DESCRIPTION OF PROCEDURE:  After informed consent, the patient was brought  to the major OR and placed on the operating table and administered spinal anesthesia, and after adequate time for spinal anesthetic effect was placed in the dorsal lithotomy position.  Her genitalia was sterilely prepped and draped, and I initially introduced a 6 [place] rigid short ureteroscope into the bladder through the urethra and identified the right ureteral orifice and was easily able to pass the scope into the orifice and up the ureter.  As I did so, I noted no tumors or stones.  I therefore passed the scope all the way to its extent but found no obstruction.  I put a guidewire through the ureteroscope and then left that in place and got the long 6 French rigid ureteroscope and passed that over the guidewire and up the ureter to as far as I could pass it or the guidewire.  There appeared to be an area at the UPJ that was preventing the scope and guidewire to pass into the renal pelvis.  I did not want to force it, so I left the guidewire in place and removed the rigid scope.  I first dilated the ureteral orifice and then attempted to pass the 6 French flexible ureteroscope over the guidewire but was unsuccessful in negotiating the orifice, so I then removed that and tried to pass a ureteral access sheath.  Again this was not progressing easily and with lack of fluoroscopy I did not want to press it, so I then removed that, reinserted  the long rigid ureteroscope, passed it all the way up the ureter next to the guidewire, and then passed a glidewire through the ureteroscope and was able to negotiate that in beyond where the guidewire kept holding up.  I then was able to advance the rigid scope over this and into the area of the obvious renal pelvis.  Visually sure that the guidewire was now in the renal pelvis, I backed the scope out.  I could not see a definite stone or lesion at the UPJ region, so with the guidewire in place the cystoscope was backloaded over the guidewire and  the 4.5 [place] double J stent was then passed up over the guidewire, and then the guidewire was removed with good curl being noted in the bladder.  A string was left affixed to the distal aspect of the stent and taped to the patients inner thigh.  She was then taken to the recovery room for recovery.  I am going to have her remain on Keflex for prophylaxis with the stent in and return to my office in a week for a right renal ultrasound to see if the hydro is resolved and then determine at that time whether the stent should be removed. Dictated by:   Veverly Fells Vernie Ammons, M.D. Attending Physician:  Trisha Mangle DD:  10/31/01 TD:  11/04/01 Job: [id number]

## 2010-08-25 NOTE — Op Note (Signed)
NAMEARTIS, BUECHELE                        ACCOUNT NO.:  [id number]   MEDICAL RECORD NO.:  [id number]                   PATIENT TYPE:  INP   LOCATION:  9311                                 FACILITY:  [facility]   PHYSICIAN:  Carrington Clamp, M.D.              DATE OF BIRTH:  October 26, 1985   DATE OF PROCEDURE:  03/24/2002  DATE OF DISCHARGE:                                 OPERATIVE REPORT   PREOPERATIVE DIAGNOSES:  Arrest of active phase.   POSTOPERATIVE DIAGNOSES:  Arrest of active phase.   PROCEDURE:  Primary low transverse cesarean section.   ATTENDING:  Carrington Clamp, M.D.   ASSISTANT:  Conni Elliot, M.D.   ANESTHESIA:  Epidural with Marcaine subcutaneous.   ESTIMATED BLOOD LOSS:  700 cc.   IV FLUIDS:  2000 cc.   URINE OUTPUT:  200 cc.   COMPLICATIONS:  None.   FINDINGS:  Female infant.  Apgars 8 and 9.  Vertex presentation.  Normal  tubes, ovaries, and uterus seen.   MEDICATIONS:  Ancef, Pitocin, and Marcaine 0.5%.   PATHOLOGY:  None.   COUNTS:  Correct x3.   TECHNIQUE:  After adequate spinal anesthesia was achieved with the aid of  about 10 cc 0.5% Marcaine directly through the subcutaneous tissues was  achieved the patient was prepped and draped in the usual fashion in dorsal  supine position with a leftward tilt.  Pfannenstiel skin incision was made  with the scalpel, carried down to the fascia with the Bovie cautery.  The  fascia was incised in the midline with the scalpel and carried in a  transversely curvilinear manner with the Mayo scissors.  The fascia was  reflected superiorly and inferiorly from the rectus muscles and the rectus  muscles split in the midline.  The abdominal free portion of the peritoneum  was entered into bluntly and the peritoneum was incised in a superior and  inferior manner with good visualization of the bowel and the bladder. The  bladder blade was placed and the vesicouterine fascia was incised in a  transverse curvilinear  manner and the bladder flap created with blunt  dissection.  The bladder blade was replaced and a 2 cm transverse incision  was made in the upper portion of the lower uterine segment.  Clear fluid was  noted upon entry into the amniotic cavity and the incision was extended in a  transverse curvilinear manner with the bandage scissors.  The baby was  identified in vertex presentation, delivered without complications, and bulb  suctioned and the cord was clamped and cut. The baby was handed to awaiting  pediatrics and the placenta was delivered manually.  The uterus was  exteriorized, wrapped in wet lap, and cleared of all debris.  The bladder  blade was replaced and the uterine incision closed with a running stitch of  0 Monocryl.  Hemostasis was achieved.  The uterus was replaced in the  abdominal cavity and the gutters cleared of all debris with irrigation.  The  uterine incision was reinspected, found to be hemostatic.  The peritoneum was closed with a running stitch of 2-0 Vicryl.  The fascia  was closed with a running stitch of 0 Vicryl.  The subcutaneous tissue was  rendered hemostatic with the Bovie cautery and irrigation.  The subcutaneous  tissue was closed with interrupted stitches of 2-0 plain gut.  The skin was  closed with staples.  The patient tolerated procedure well and was returned  to recovery room in stable condition.                                               Carrington Clamp, M.D.    MH/MEDQ  D:  03/24/2002  T:  03/25/2002  Job:  [id number]

## 2010-09-01 ENCOUNTER — Ambulatory Visit: Payer: Self-pay | Admitting: Family Medicine

## 2011-01-12 LAB — WET PREP, GENITAL
Clue Cells Wet Prep HPF POC: NONE SEEN
Trich, Wet Prep: NONE SEEN
WBC, Wet Prep HPF POC: NONE SEEN
Yeast Wet Prep HPF POC: NONE SEEN

## 2011-01-12 LAB — URINALYSIS, ROUTINE W REFLEX MICROSCOPIC
Glucose, UA: NEGATIVE mg/dL
Ketones, ur: 15 mg/dL — AB
Nitrite: NEGATIVE
Protein, ur: 100 mg/dL — AB
Specific Gravity, Urine: 1.035 — ABNORMAL HIGH (ref 1.005–1.030)
Urobilinogen, UA: 1 mg/dL (ref 0.0–1.0)
pH: 6 (ref 5.0–8.0)

## 2011-01-12 LAB — CULTURE, BLOOD (ROUTINE X 2)
Culture: NO GROWTH
Culture: NO GROWTH

## 2011-01-12 LAB — POCT I-STAT, CHEM 8
BUN: 7 mg/dL (ref 6–23)
Calcium, Ion: 0.97 mmol/L — ABNORMAL LOW (ref 1.12–1.32)
Chloride: 107 mEq/L (ref 96–112)
Creatinine, Ser: 1.1 mg/dL (ref 0.4–1.2)
Glucose, Bld: 86 mg/dL (ref 70–99)
HCT: 41 % (ref 36.0–46.0)
Hemoglobin: 13.9 g/dL (ref 12.0–15.0)
Potassium: 3.6 mEq/L (ref 3.5–5.1)
Sodium: 137 mEq/L (ref 135–145)
TCO2: 22 mmol/L (ref 0–100)

## 2011-01-12 LAB — URINE MICROSCOPIC-ADD ON

## 2011-01-12 LAB — CBC
HCT: 40.4 % (ref 36.0–46.0)
Hemoglobin: 14.1 g/dL (ref 12.0–15.0)
MCHC: 34.9 g/dL (ref 30.0–36.0)
MCV: 88.7 fL (ref 78.0–100.0)
Platelets: 270 10*3/uL (ref 150–400)
RBC: 4.56 MIL/uL (ref 3.87–5.11)
RDW: 12.5 % (ref 11.5–15.5)
WBC: 4.6 10*3/uL (ref 4.0–10.5)

## 2011-01-12 LAB — DIFFERENTIAL
Basophils Absolute: 0 10*3/uL (ref 0.0–0.1)
Basophils Relative: 0 % (ref 0–1)
Eosinophils Absolute: 0 10*3/uL (ref 0.0–0.7)
Eosinophils Relative: 1 % (ref 0–5)
Lymphocytes Relative: 38 % (ref 12–46)
Lymphs Abs: 1.7 10*3/uL (ref 0.7–4.0)
Monocytes Absolute: 0.4 10*3/uL (ref 0.1–1.0)
Monocytes Relative: 9 % (ref 3–12)
Neutro Abs: 2.4 10*3/uL (ref 1.7–7.7)
Neutrophils Relative %: 52 % (ref 43–77)

## 2011-01-12 LAB — PREGNANCY, URINE: Preg Test, Ur: NEGATIVE

## 2011-01-12 LAB — MAGNESIUM: Magnesium: 2.1 mg/dL (ref 1.5–2.5)

## 2011-01-12 LAB — TSH: TSH: 3.461 u[IU]/mL (ref 0.350–4.500)

## 2011-01-12 LAB — GC/CHLAMYDIA PROBE AMP, GENITAL
Chlamydia, DNA Probe: NEGATIVE
GC Probe Amp, Genital: NEGATIVE

## 2011-01-12 LAB — PHOSPHORUS: Phosphorus: 3 mg/dL (ref 2.3–4.6)

## 2011-01-18 LAB — URINALYSIS, ROUTINE W REFLEX MICROSCOPIC
Bilirubin Urine: NEGATIVE
Glucose, UA: NEGATIVE
Ketones, ur: NEGATIVE
Nitrite: POSITIVE — AB
Protein, ur: NEGATIVE
Specific Gravity, Urine: 1.021
Urobilinogen, UA: 1
pH: 6.5

## 2011-01-18 LAB — CBC
HCT: 40.8
Hemoglobin: 14.2
MCHC: 34.8
MCV: 90.8
Platelets: 341
RBC: 4.49
RDW: 12.1
WBC: 6

## 2011-01-18 LAB — URINE MICROSCOPIC-ADD ON

## 2011-01-18 LAB — HCG, SERUM, QUALITATIVE: Preg, Serum: NEGATIVE

## 2013-09-02 ENCOUNTER — Ambulatory Visit: Payer: Self-pay | Admitting: Obstetrics and Gynecology

## 2013-09-07 ENCOUNTER — Ambulatory Visit: Payer: Self-pay | Admitting: Obstetrics and Gynecology

## 2014-07-20 ENCOUNTER — Ambulatory Visit
Admit: 2014-07-20 | Disposition: A | Discharge status: Home / Self Care | Payer: Self-pay | Attending: Unknown Physician Specialty | Admitting: Unknown Physician Specialty

## 2014-08-13 ENCOUNTER — Other Ambulatory Visit: Payer: Self-pay | Admitting: Family Medicine

## 2014-08-13 DIAGNOSIS — R109 Unspecified abdominal pain: Secondary | ICD-10-CM

## 2014-08-17 ENCOUNTER — Ambulatory Visit: Admission: RE | Admit: 2014-08-17 | Payer: Self-pay | Source: Ambulatory Visit

## 2014-08-18 ENCOUNTER — Other Ambulatory Visit: Payer: Self-pay | Admitting: Unknown Physician Specialty

## 2014-08-18 DIAGNOSIS — R1011 Right upper quadrant pain: Secondary | ICD-10-CM

## 2014-08-25 ENCOUNTER — Ambulatory Visit
Admission: RE | Admit: 2014-08-25 | Discharge: 2014-08-25 | Disposition: A | Discharge status: Home / Self Care | Payer: Managed Care, Other (non HMO) | Source: Ambulatory Visit | Attending: Unknown Physician Specialty | Admitting: Unknown Physician Specialty

## 2014-08-25 DIAGNOSIS — R1011 Right upper quadrant pain: Secondary | ICD-10-CM | POA: Diagnosis present

## 2014-08-25 MED ORDER — SINCALIDE 5 MCG IJ SOLR
0.0200 ug/kg | Freq: Once | INTRAMUSCULAR | Status: AC
  Administered 2014-08-25: 2.1 ug via INTRAVENOUS

## 2014-08-25 MED ORDER — TECHNETIUM TC 99M MEBROFENIN IV KIT
5.0000 | PACK | Freq: Once | INTRAVENOUS | Status: AC | PRN
  Administered 2014-08-25: 4.96 via INTRAVENOUS

## 2014-09-02 ENCOUNTER — Other Ambulatory Visit: Payer: Self-pay | Admitting: Urology

## 2014-09-02 DIAGNOSIS — N200 Calculus of kidney: Secondary | ICD-10-CM

## 2014-09-08 ENCOUNTER — Ambulatory Visit: Payer: Managed Care, Other (non HMO)

## 2014-09-14 ENCOUNTER — Other Ambulatory Visit: Payer: Self-pay

## 2014-09-14 DIAGNOSIS — N200 Calculus of kidney: Secondary | ICD-10-CM

## 2014-09-20 ENCOUNTER — Ambulatory Visit: Payer: Self-pay | Admitting: Urology

## 2014-09-29 ENCOUNTER — Other Ambulatory Visit: Payer: Self-pay | Admitting: Unknown Physician Specialty

## 2014-10-13 ENCOUNTER — Encounter: Payer: Self-pay | Admitting: Family Medicine

## 2014-10-13 ENCOUNTER — Ambulatory Visit (INDEPENDENT_AMBULATORY_CARE_PROVIDER_SITE_OTHER): Payer: Managed Care, Other (non HMO) | Admitting: Family Medicine

## 2014-10-13 VITALS — BP 139/105 | HR 77 | Temp 98.6°F | Ht 62.2 in | Wt 251.2 lb

## 2014-10-13 DIAGNOSIS — J069 Acute upper respiratory infection, unspecified: Secondary | ICD-10-CM

## 2014-10-13 MED ORDER — PREDNISONE 10 MG PO TABS: ORAL_TABLET | ORAL | Status: DC

## 2014-10-13 MED ORDER — GUAIFENESIN-CODEINE 100-10 MG/5ML PO SYRP: 5.0000 mL | ORAL_SOLUTION | Freq: Three times a day (TID) | ORAL | Status: DC | PRN

## 2014-10-13 NOTE — Progress Notes (Signed)
BP 139/105 mmHg  Pulse 77  Temp(Src) 98.6 F (37 C)  Ht 5' 2.2" (1.58 m)  Wt 251 lb 3.2 oz (113.944 kg)  BMI 45.64 kg/m2  SpO2 98%  LMP 10/06/2014 (Exact Date)   Subjective:    Patient ID: [name], female    DOB: 06/05/1985, 29 y.o.   MRN: [id number]  HPI: Bailey ConesHeather Rose Lieske is a 29 y.o. female  Chief Complaint  Patient presents with  . URI    sore throat, headache, cough, fever and chills, chest congestion   UPPER RESPIRATORY TRACT INFECTION- 6 days Worst symptom: cough, sore throat Fever: yes Cough: yes Shortness of breath: no Wheezing: no Chest pain: no Chest tightness: no Chest congestion: yes Nasal congestion: no Runny nose: no Post nasal drip: no Sneezing: no Sore throat: yes Swollen glands: yes Sinus pressure: no Headache: yes Face pain: no Toothache: yes Ear pain: no  Ear pressure: yes bilateral Eyes red/itching:no Eye drainage/crusting: no  Vomiting: no Rash: no Fatigue: yes Sick contacts: yes Strep contacts: no  Context: worse Recurrent sinusitis: no Relief with OTC cold/cough medications: no  Treatments attempted: cold/sinus, mucinex and anti-histamine    Relevant past medical, surgical, family and social history reviewed and updated as indicated. Interim medical history since our last visit reviewed. Allergies and medications reviewed and updated.  Review of Systems  Constitutional: Negative.   HENT: Negative.   Respiratory: Negative.   Cardiovascular: Negative.   Psychiatric/Behavioral: Negative.     Per HPI unless specifically indicated above     Objective:    BP 139/105 mmHg  Pulse 77  Temp(Src) 98.6 F (37 C)  Ht 5' 2.2" (1.58 m)  Wt 251 lb 3.2 oz (113.944 kg)  BMI 45.64 kg/m2  SpO2 98%  LMP 10/06/2014 (Exact Date)  Wt Readings from Last 3 Encounters:  10/13/14 251 lb 3.2 oz (113.944 kg)  08/25/14 231 lb (104.781 kg)  02/03/08 231 lb (104.781 kg)    Physical Exam  Constitutional: She is oriented to  person, place, and time. She appears well-developed and well-nourished. No distress.  HENT:  Head: Normocephalic and atraumatic.  Right Ear: Hearing and external ear normal.  Left Ear: Hearing and external ear normal.  Nose: Mucosal edema and rhinorrhea present. No nose lacerations, sinus tenderness, nasal deformity, septal deviation or nasal septal hematoma. No epistaxis.  No foreign bodies. Right sinus exhibits no maxillary sinus tenderness and no frontal sinus tenderness. Left sinus exhibits no maxillary sinus tenderness and no frontal sinus tenderness.  Mouth/Throat: No oropharyngeal exudate.  Eyes: Conjunctivae and lids are normal. Pupils are equal, round, and reactive to light. Right eye exhibits no discharge. Left eye exhibits no discharge. No scleral icterus.  Neck: Normal range of motion. Neck supple. No JVD present. No tracheal deviation present. No thyromegaly present.  Cardiovascular: Normal rate, regular rhythm and normal heart sounds.  Exam reveals no gallop and no friction rub.   No murmur heard. Pulmonary/Chest: Effort normal. No stridor. No respiratory distress. She has no wheezes. She has no rales. She exhibits no tenderness.  Musculoskeletal: Normal range of motion.  Lymphadenopathy:    She has cervical adenopathy.  Neurological: She is alert and oriented to person, place, and time.  Skin: Skin is warm, dry and intact. No rash noted. No erythema. No pallor.  Psychiatric: She has a normal mood and affect. Her speech is normal and behavior is normal. Judgment and thought content normal. Cognition and memory are normal.  Nursing note and vitals reviewed.  Assessment & Plan:   Problem List Items Addressed This Visit    None    Visit Diagnoses    Upper respiratory infection    -  Primary    Will treat symptomatically. Call if not getting better or getting worse. Continue to monitor. See patient instructions.        Follow up plan: Return if symptoms worsen or  fail to improve.

## 2014-10-13 NOTE — Patient Instructions (Signed)
Your symptoms and exam findings are most consistent with a viral upper respiratory infection. These usually run their course in 5-7 days. Unfortunately, antibiotics don't work against viruses and just increase your risk of other issues such as diarrhea, yeast infections, and resistant infections.  If you start feeling worse with facial pain, high fever, cough, shortness of breath or start feeling significantly worse, please call us right away to be further evaluated.  Some things that can make you feel better are: - Increased rest - Increasing Fluids - Acetaminophen / ibuprofen as needed for fever/pain.  - Salt water gargling, chloraseptic spray and throat lozenges - OTC pseudoephedrine.  - Mucinex.  - Saline sinus flushes or a neti pot.  - Humidifying the air.  

## 2014-10-18 ENCOUNTER — Encounter: Payer: Self-pay | Admitting: Family Medicine

## 2014-10-18 ENCOUNTER — Ambulatory Visit (INDEPENDENT_AMBULATORY_CARE_PROVIDER_SITE_OTHER): Payer: Self-pay | Admitting: Family Medicine

## 2014-10-18 VITALS — BP 135/94 | HR 83 | Temp 98.6°F | Wt 251.0 lb

## 2014-10-18 DIAGNOSIS — J329 Chronic sinusitis, unspecified: Secondary | ICD-10-CM | POA: Insufficient documentation

## 2014-10-18 DIAGNOSIS — J0100 Acute maxillary sinusitis, unspecified: Secondary | ICD-10-CM

## 2014-10-18 MED ORDER — GUAIFENESIN-CODEINE 100-10 MG/5ML PO SYRP: 5.0000 mL | ORAL_SOLUTION | Freq: Three times a day (TID) | ORAL | Status: DC | PRN

## 2014-10-18 MED ORDER — AMOXICILLIN-POT CLAVULANATE 875-125 MG PO TABS: 1.0000 | ORAL_TABLET | Freq: Two times a day (BID) | ORAL | Status: DC

## 2014-10-18 NOTE — Progress Notes (Signed)
BP 135/94 mmHg  Pulse 83  Temp(Src) 98.6 F (37 C)  Wt 251 lb (113.853 kg)  SpO2 99%  LMP 10/06/2014 (Exact Date)   Subjective:    Patient ID: [name], female    DOB: 05/11/1985, 29 y.o.   MRN: [id number]  HPI: [name] is a 29 y.o. female  Chief Complaint  Patient presents with  . URI    Patient states that she is not any better she has 1 steriod pill left to take. So also states that the cough medication does not work.   UPPER RESPIRATORY TRACT INFECTION Worst symptom: cough and sore throat, got better on prednisone then got much worse Fever: yes Cough: yes Shortness of breath: no Wheezing: no Chest pain: no Chest tightness: yes Chest congestion: yes Nasal congestion: yes Runny nose: yes Post nasal drip: yes Sneezing: yes Sore throat: yes Swollen glands: yes Sinus pressure: yes Headache: yes Face pain: yes Toothache: yes Ear pain: no  Ear pressure: no  Eyes red/itching:no Eye drainage/crusting: no  Vomiting: no Rash: no Fatigue: yes Sick contacts: no Strep contacts: no  Context: worse Recurrent sinusitis: no Relief with OTC cold/cough medications: no  Treatments attempted: cold/sinus, mucinex, anti-histamine, pseudoephedrine and cough syrup   Relevant past medical, surgical, family and social history reviewed and updated as indicated. Interim medical history since our last visit reviewed. Allergies and medications reviewed and updated.  Review of Systems  Constitutional: Negative.   HENT: Negative.   Cardiovascular: Negative.   Psychiatric/Behavioral: Negative.     Per HPI unless specifically indicated above     Objective:    BP 135/94 mmHg  Pulse 83  Temp(Src) 98.6 F (37 C)  Wt 251 lb (113.853 kg)  SpO2 99%  LMP 10/06/2014 (Exact Date)  Wt Readings from Last 3 Encounters:  10/18/14 251 lb (113.853 kg)  08/13/14 248 lb (112.492 kg)  10/13/14 251 lb 3.2 oz (113.944 kg)    Physical Exam  Constitutional: She is  oriented to person, place, and time. She appears well-developed and well-nourished. No distress.  HENT:  Head: Normocephalic and atraumatic.  Right Ear: Hearing, tympanic membrane, external ear and ear canal normal.  Left Ear: Hearing, tympanic membrane, external ear and ear canal normal.  Nose: Mucosal edema and rhinorrhea present. No nose lacerations, sinus tenderness, nasal deformity, septal deviation or nasal septal hematoma. No epistaxis.  No foreign bodies. Right sinus exhibits no maxillary sinus tenderness and no frontal sinus tenderness. Left sinus exhibits maxillary sinus tenderness. Left sinus exhibits no frontal sinus tenderness.  Mouth/Throat: Uvula is midline and mucous membranes are normal. Mucous membranes are not pale, not dry and not cyanotic. Posterior oropharyngeal edema and posterior oropharyngeal erythema present. No oropharyngeal exudate or tonsillar abscesses.  Eyes: Conjunctivae and lids are normal. Right eye exhibits no discharge. Left eye exhibits no discharge. No scleral icterus.  Neck: No JVD present. No tracheal deviation present. No thyromegaly present.  Cardiovascular: Normal rate, regular rhythm and normal heart sounds.  Exam reveals no gallop and no friction rub.   No murmur heard. Pulmonary/Chest: Effort normal. No stridor. No respiratory distress. She has no wheezes. She has no rales. She exhibits no tenderness.  Abdominal: Soft. Bowel sounds are normal. She exhibits no distension and no mass. There is no tenderness. There is no rebound and no guarding.  Musculoskeletal: Normal range of motion.  Lymphadenopathy:    She has cervical adenopathy.  Neurological: She is alert and oriented to person, place, and time.  Skin: Skin is intact. No rash noted.  Psychiatric: She has a normal mood and affect. Her speech is normal and behavior is normal. Judgment and thought content normal. Cognition and memory are normal.  Nursing note and vitals reviewed.     Assessment &  Plan:   Problem List Items Addressed This Visit      Respiratory   Sinusitis - Primary    Appears to have sinus infection. Will start her on augmentin and refill on cheritussin given to help with cough. Call if not getting better or getting worse. Advised probiotic. Call if getting a yeast infection.       Relevant Medications   guaiFENesin-codeine (CHERATUSSIN AC) 100-10 MG/5ML syrup   amoxicillin-clavulanate (AUGMENTIN) 875-125 MG per tablet       Follow up plan: Return if symptoms worsen or fail to improve.

## 2014-10-18 NOTE — Assessment & Plan Note (Signed)
Appears to have sinus infection. Will start her on augmentin and refill on cheritussin given to help with cough. Call if not getting better or getting worse. Advised probiotic. Call if getting a yeast infection.

## 2014-10-28 ENCOUNTER — Telehealth: Payer: Self-pay

## 2014-10-28 MED ORDER — FLUCONAZOLE 150 MG PO TABS: 150.0000 mg | ORAL_TABLET | Freq: Once | ORAL | Status: DC

## 2014-10-28 MED ORDER — AZITHROMYCIN 250 MG PO TABS: ORAL_TABLET | ORAL | Status: DC

## 2014-10-28 NOTE — Telephone Encounter (Signed)
Patient not getting better requesting change in antibiotics and yeast infection medicine which were called in.

## 2014-10-28 NOTE — Telephone Encounter (Signed)
done

## 2014-10-28 NOTE — Telephone Encounter (Signed)
Patient called, she states that she is still sick. She was seen by MJ on 10/13/14 and 10/18/14. She told her that if she didn't get better or if she got a yeast infection call the office. Patient would like a script for sinus infection and yeast infection.

## 2014-12-15 ENCOUNTER — Encounter: Payer: Self-pay | Admitting: Family Medicine

## 2014-12-15 ENCOUNTER — Ambulatory Visit (INDEPENDENT_AMBULATORY_CARE_PROVIDER_SITE_OTHER): Payer: 59 | Admitting: Family Medicine

## 2014-12-15 VITALS — BP 142/96 | HR 74 | Temp 99.0°F | Ht 61.0 in | Wt 252.0 lb

## 2014-12-15 DIAGNOSIS — M5010 Cervical disc disorder with radiculopathy, unspecified cervical region: Secondary | ICD-10-CM

## 2014-12-15 DIAGNOSIS — IMO0001 Reserved for inherently not codable concepts without codable children: Secondary | ICD-10-CM | POA: Insufficient documentation

## 2014-12-15 DIAGNOSIS — R030 Elevated blood-pressure reading, without diagnosis of hypertension: Secondary | ICD-10-CM | POA: Diagnosis not present

## 2014-12-15 DIAGNOSIS — L680 Hirsutism: Secondary | ICD-10-CM | POA: Diagnosis not present

## 2014-12-15 MED ORDER — CYCLOBENZAPRINE HCL 10 MG PO TABS: 10.0000 mg | ORAL_TABLET | Freq: Three times a day (TID) | ORAL | Status: DC | PRN

## 2014-12-15 MED ORDER — SPIRONOLACTONE 25 MG PO TABS: 25.0000 mg | ORAL_TABLET | Freq: Every day | ORAL | Status: DC

## 2014-12-15 MED ORDER — NAPROXEN 375 MG PO TABS: 375.0000 mg | ORAL_TABLET | Freq: Two times a day (BID) | ORAL | Status: DC

## 2014-12-15 MED ORDER — SPIRONOLACTONE 50 MG PO TABS: 50.0000 mg | ORAL_TABLET | Freq: Every day | ORAL | Status: DC

## 2014-12-15 NOTE — Assessment & Plan Note (Signed)
Ibuprofen may be impacting pressures; switch to naproxen; avoid other NSAIDs; okay to take tylenol per package directions; turmeric might be helpful; refill of the muscle relaxers; cautions given; do not drive or operate machinery for 8 hours after taking

## 2014-12-15 NOTE — Patient Instructions (Addendum)
Your goal blood pressure is less than 140 mmHg on top. Try to follow the DASH guidelines (DASH stands for Dietary Approaches to Stop Hypertension) Try to limit the sodium in your diet.  Ideally, consume less than 1.5 grams (less than 1,500mg ) per day. Do not add salt when cooking or at the table.  Check the sodium amount on labels when shopping, and choose items lower in sodium when given a choice. Avoid or limit foods that already contain a lot of sodium. Eat a diet rich in fruits and vegetables and whole grains. Try turmeric as a natural anti-inflammatory (for pain and arthritis). It comes in capsules where you buy aspirin and fish oil, but also as a spice where you buy pepper and garlic powder. Return in one month to see [name] Continue the spironolactone 25 mg for now but plan to increase to 50 mg if [name] agrees next visit Avoid potassium-rich foods and avoid salt substitutes [place] -- out of work last week Wed, Thurs, Friday Note to say no heavy lifting more than five pounds for the next two weeks  DASH Eating Plan DASH stands for "Dietary Approaches to Stop Hypertension." The DASH eating plan is a healthy eating plan that has been shown to reduce high blood pressure (hypertension). Additional health benefits may include reducing the risk of type 2 diabetes mellitus, heart disease, and stroke. The DASH eating plan may also help with weight loss. WHAT DO I NEED TO KNOW ABOUT THE DASH EATING PLAN? For the DASH eating plan, you will follow these general guidelines:  Choose foods with a percent daily value for sodium of less than 5% (as listed on the food label).  Use salt-free seasonings or herbs instead of table salt or sea salt.  Check with your health care provider or pharmacist before using salt substitutes.  Eat lower-sodium products, often labeled as "lower sodium" or "no salt added."  Eat fresh foods.  Eat more vegetables, fruits, and low-fat dairy products.  Choose whole grains.  Look for the word "whole" as the first word in the ingredient list.  Choose fish and skinless chicken or [place] more often than red meat. Limit fish, poultry, and meat to 6 oz (170 g) each day.  Limit sweets, desserts, sugars, and sugary drinks.  Choose heart-healthy fats.  Limit cheese to 1 oz (28 g) per day.  Eat more home-cooked food and less restaurant, buffet, and fast food.  Limit fried foods.  Cook foods using methods other than frying.  Limit canned vegetables. If you do use them, rinse them well to decrease the sodium.  When eating at a restaurant, ask that your food be prepared with less salt, or no salt if possible. WHAT FOODS CAN I EAT? Seek help from a dietitian for individual calorie needs. Grains Whole grain or whole wheat bread. Brown rice. Whole grain or whole wheat pasta. Quinoa, bulgur, and whole grain cereals. Low-sodium cereals. Corn or whole wheat flour tortillas. Whole grain cornbread. Whole grain crackers. Low-sodium crackers. Vegetables Fresh or frozen vegetables (raw, steamed, roasted, or grilled). Low-sodium or reduced-sodium tomato and vegetable juices. Low-sodium or reduced-sodium tomato sauce and paste. Low-sodium or reduced-sodium canned vegetables.  Fruits All fresh, canned (in natural juice), or frozen fruits. Meat and Other Protein Products Ground beef (85% or leaner), grass-fed beef, or beef trimmed of fat. Skinless chicken or [place]. Ground chicken or [place]. Pork trimmed of fat. All fish and seafood. Eggs. Dried beans, peas, or lentils. Unsalted nuts and seeds. Unsalted canned  beans. Dairy Low-fat dairy products, such as skim or 1% milk, 2% or reduced-fat cheeses, low-fat ricotta or cottage cheese, or plain low-fat yogurt. Low-sodium or reduced-sodium cheeses. Fats and Oils Tub margarines without trans fats. Light or reduced-fat mayonnaise and salad dressings (reduced sodium). Avocado. Safflower, olive, or canola oils. Natural peanut or almond  butter. Other Unsalted popcorn and pretzels. The items listed above may not be a complete list of recommended foods or beverages. Contact your dietitian for more options. WHAT FOODS ARE NOT RECOMMENDED? Grains White bread. White pasta. White rice. Refined cornbread. Bagels and croissants. Crackers that contain trans fat. Vegetables Creamed or fried vegetables. Vegetables in a cheese sauce. Regular canned vegetables. Regular canned tomato sauce and paste. Regular tomato and vegetable juices. Fruits Dried fruits. Canned fruit in light or heavy syrup. Fruit juice. Meat and Other Protein Products Fatty cuts of meat. Ribs, chicken wings, bacon, sausage, bologna, salami, chitterlings, fatback, hot dogs, bratwurst, and packaged luncheon meats. Salted nuts and seeds. Canned beans with salt. Dairy Whole or 2% milk, cream, half-and-half, and cream cheese. Whole-fat or sweetened yogurt. Full-fat cheeses or blue cheese. Nondairy creamers and whipped toppings. Processed cheese, cheese spreads, or cheese curds. Condiments Onion and garlic salt, seasoned salt, table salt, and sea salt. Canned and packaged gravies. Worcestershire sauce. Tartar sauce. Barbecue sauce. Teriyaki sauce. Soy sauce, including reduced sodium. Steak sauce. Fish sauce. Oyster sauce. Cocktail sauce. Horseradish. Ketchup and mustard. Meat flavorings and tenderizers. Bouillon cubes. Hot sauce. Tabasco sauce. Marinades. Taco seasonings. Relishes. Fats and Oils Butter, stick margarine, lard, shortening, ghee, and bacon fat. Coconut, palm kernel, or palm oils. Regular salad dressings. Other Pickles and olives. Salted popcorn and pretzels. The items listed above may not be a complete list of foods and beverages to avoid. Contact your dietitian for more information. WHERE CAN I FIND MORE INFORMATION? National Heart, [employer], and Blood Institute: travelstabloid.com Document Released: 03/15/2011 Document Revised:  08/10/2013 Document Reviewed: 01/28/2013 [facility] Patient Information 2015 [place], [place]. This information is not intended to replace advice given to you by your health care provider. Make sure you discuss any questions you have with your health care provider.

## 2014-12-15 NOTE — Progress Notes (Signed)
BP 142/96 mmHg  Pulse 74  Temp(Src) 99 F (37.2 C)  Ht 5\' 1"  (1.549 m)  Wt 252 lb (114.306 kg)  BMI 47.64 kg/m2  SpO2 98%  LMP 11/26/2014 (Exact Date)   Subjective:    Patient ID: [name], female    DOB: Sep 05, 1985, 29 y.o.   MRN: [id number]  HPI: [name] is a 29 y.o. female  Chief Complaint  Patient presents with  . Neck Pain    x 10 days. Felt like she hurt it last Monday at work.   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 and help  Out She was having numbness in the right arm all the way down to the fingers, mostly ring finger and thumb; like when foot goes to sleep; she could still use it and feel them, they just felt funny like when you sit on your foot funny; the right arm and fingers are better now; she had taken the ibuprofen Wed, Thurs, and Fri and could tell a big difference and could tell improvement; no upset stomach as long as she eats something with it This is similar to previous flares She does not want surgery; she'd rather not go back to her neck doctor right now; does not want injections either right now 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 Interim medical history since our last visit reviewed. Allergies and medications reviewed and updated.  Review of Systems  Per HPI unless specifically indicated above     Objective:    BP 142/96 mmHg  Pulse 74  Temp(Src) 99 F (37.2 C)  Ht 5\' 1"  (1.549 m)  Wt 252 lb (114.306 kg)  BMI 47.64 kg/m2  SpO2 98%  LMP 11/26/2014 (Exact Date)  Wt Readings from Last 3 Encounters:  12/15/14 252 lb (114.306 kg)  10/18/14 251 lb (113.853 kg)  08/13/14 248 lb (112.492 kg)    Physical Exam  Constitutional: She appears well-developed and well-nourished. No distress.  Morbidly obese  HENT:  Head: Normocephalic and atraumatic.  Eyes: EOM are normal. No scleral icterus.  Neck: Neck supple. No spinous process tenderness and no muscular tenderness present. Normal range of motion present.  Cardiovascular: Normal rate.   No murmur heard. Pulmonary/Chest: Effort normal. No respiratory distress.  Abdominal: She  exhibits no distension.  Musculoskeletal: Normal range of motion. She exhibits no edema.  Neurological: She is alert. She displays no tremor. She exhibits normal muscle tone. Gait normal.  UE strength 5/5  Skin: Skin is warm and dry. No rash noted. She is not diaphoretic. No pallor.  No visible hirsutism (patient shaves); acne along the chin, jawline  Psychiatric: She has a normal mood and affect. Her behavior is normal. Judgment and thought content normal.       Assessment & Plan:   Problem List Items Addressed This Visit      Musculoskeletal and Integument   Hirsutism    Considered increasing spironolactone to 50 mg, but then decided to have her f/u with Elnita Maxwell (primary provider) next month for f/u and to see if she has PCOS  and if other testing indicated, did not do full chart review today to see if labs, [place] have been done; I will not increase the spironolactone right now either since she'll be on the NSAID since that might cause potassium elevation; patient agrees and will f/u with her primary      Cervical disc disorder with radiculopathy of cervical region - Primary    Ibuprofen may be impacting pressures; switch to naproxen; avoid other NSAIDs; okay to take tylenol per package directions; turmeric might be helpful; refill of the muscle relaxers; cautions given; do not drive or operate machinery for 8 hours after taking        Other   Elevated blood pressure    We reviewed her blood pressure readings over the last several years; she is not chronically hypertensive; will have her follow the DASH guidelines; stop the ibuprofen and use naproxen; f/u with primary; avoid salt substitutes, especially on the spironolactone         Follow up plan: Return in about 4 weeks (around 01/12/2015) for cheryl. An after-visit summary was printed and given to the patient at check-out.  Please see the patient instructions which may contain other information and recommendations beyond what is mentioned above in the assessment and plan.  Meds ordered this encounter  Medications  . DISCONTD: spironolactone (ALDACTONE) 50 MG tablet    Sig: Take 1 tablet (50 mg total) by mouth daily.    Dispense:  30 tablet    Refill:  0  . naproxen (NAPROSYN) 375 MG tablet    Sig: Take 1 tablet (375 mg total) by mouth 2 (two) times daily with a meal.    Dispense:  60 tablet    Refill:  0  . cyclobenzaprine (FLEXERIL) 10 MG tablet    Sig: Take 1 tablet (10 mg total) by mouth every 8 (eight) hours as needed for muscle spasms.    Dispense:  30 tablet    Refill:  0  . spironolactone (ALDACTONE) 25 MG tablet    Sig: Take 1 tablet (25 mg total) by mouth daily.    Dispense:  30 tablet    Refill:  0    IGNORE the 50 mg strength and just fill  this 25 mg strength please

## 2014-12-15 NOTE — Assessment & Plan Note (Addendum)
Considered increasing spironolactone to 50 mg, but then decided to have her f/u with Elnita Maxwell (primary provider) next month for f/u and to see if she has PCOS and if other testing indicated, did not do full chart review today to see if labs, [place] have been done; I will not increase the spironolactone right now either since she'll be on the NSAID since that might cause potassium elevation; patient agrees and will f/u with her primary

## 2014-12-15 NOTE — Assessment & Plan Note (Signed)
We reviewed her blood pressure readings over the last several years; she is not chronically hypertensive; will have her follow the DASH guidelines; stop the ibuprofen and use naproxen; f/u with primary; avoid salt substitutes, especially on the spironolactone

## 2015-01-07 ENCOUNTER — Ambulatory Visit (INDEPENDENT_AMBULATORY_CARE_PROVIDER_SITE_OTHER): Payer: 59 | Admitting: Unknown Physician Specialty

## 2015-01-07 ENCOUNTER — Encounter: Payer: Self-pay | Admitting: Unknown Physician Specialty

## 2015-01-07 VITALS — BP 129/83 | HR 98 | Temp 98.4°F | Ht 61.7 in | Wt 251.8 lb

## 2015-01-07 DIAGNOSIS — Z5181 Encounter for therapeutic drug level monitoring: Secondary | ICD-10-CM | POA: Diagnosis not present

## 2015-01-07 DIAGNOSIS — E282 Polycystic ovarian syndrome: Secondary | ICD-10-CM | POA: Diagnosis not present

## 2015-01-07 DIAGNOSIS — L680 Hirsutism: Secondary | ICD-10-CM

## 2015-01-07 MED ORDER — LEVONORGESTREL-ETHINYL ESTRAD 0.1-20 MG-MCG PO TABS: 1.0000 | ORAL_TABLET | Freq: Every day | ORAL | Status: DC

## 2015-01-07 MED ORDER — SPIRONOLACTONE 25 MG PO TABS: 25.0000 mg | ORAL_TABLET | Freq: Every day | ORAL | Status: DC

## 2015-01-07 NOTE — Assessment & Plan Note (Signed)
Started on levonorgestrel-ethinyl estradiol 0.1-20mg . Continue spironolactone .

## 2015-01-07 NOTE — Assessment & Plan Note (Signed)
Started on levonorgestrel-ethinyl estradiol 0.1-20mg. Continue spironolactone 25mg. 

## 2015-01-07 NOTE — Progress Notes (Signed)
BP 129/83 mmHg  Pulse 98  Temp(Src) 98.4 F (36.9 C)  Ht 5' 1.7" (1.567 m)  Wt 251 lb 12.8 oz (114.216 kg)  BMI 46.51 kg/m2  SpO2 97%  LMP 12/25/2014 (Exact Date)   Subjective:    Patient ID: [name], female    DOB: 1985-07-01, 29 y.o.   MRN: [id number]  HPI: [name] is a 29 y.o. female  Chief Complaint  Patient presents with  . Follow-up    pt states she is here to follow-up on new medication she started- spironolactone   Polycystic Ovarian Syndrome: She was started on spironolactone in April but only took it for two months before losing insurance coverage. She saw Dr. Sherie Don in July who restarted the spironolactone. She does not notice a difference nor did she during the initial two months of medication. She continues with unwanted hair growth and has not attempted any removal treatments. Discussed the role of weight reduction and improvement of symptoms. She was receptive and is hopeful she can make changes once she starts her new job in two weeks.  Relevant past medical, surgical, family and social history reviewed and updated as indicated. Interim medical history since our last visit reviewed. Allergies and medications reviewed and updated.  Review of Systems  Constitutional: Negative.  Negative for activity change, appetite change and fatigue.  HENT: Negative for congestion, postnasal drip, rhinorrhea, sinus pressure, sneezing and sore throat.   Eyes: Negative.  Negative for discharge and redness.  Respiratory: Negative.  Negative for cough, chest tightness and shortness of breath.   Cardiovascular: Negative.  Negative for chest pain, palpitations and leg swelling.  Gastrointestinal: Negative.  Negative for nausea, abdominal pain, diarrhea and constipation.  Musculoskeletal: Negative.  Negative for myalgias, back pain, arthralgias and gait problem.  Skin: Negative.  Negative for color change, pallor, rash and wound.  Neurological: Negative for  dizziness, light-headedness and headaches.  Hematological: Negative.   Psychiatric/Behavioral: Negative.  Negative for behavioral problems and sleep disturbance. The patient is not nervous/anxious.     Per HPI unless specifically indicated above     Objective:    BP 129/83 mmHg  Pulse 98  Temp(Src) 98.4 F (36.9 C)  Ht 5' 1.7" (1.567 m)  Wt 251 lb 12.8 oz (114.216 kg)  BMI 46.51 kg/m2  SpO2 97%  LMP 12/25/2014 (Exact Date)  Wt Readings from Last 3 Encounters:  01/07/15 251 lb 12.8 oz (114.216 kg)  12/15/14 252 lb (114.306 kg)  10/18/14 251 lb (113.853 kg)    Physical Exam  Constitutional: She is oriented to person, place, and time. She appears well-developed and well-nourished. No distress.  HENT:  Head: Normocephalic and atraumatic.  Neck: Normal range of motion.  Cardiovascular: Normal rate, regular rhythm and normal heart sounds.  Exam reveals no gallop and no friction rub.   No murmur heard. Pulmonary/Chest: Effort normal and breath sounds normal. No respiratory distress. She has no wheezes. She has no rales.  Musculoskeletal: Normal range of motion. She exhibits no edema or tenderness.  Neurological: She is alert and oriented to person, place, and time.  Skin: Skin is warm and dry. No rash noted. She is not diaphoretic. No erythema. No pallor.  Psychiatric: She has a normal mood and affect. Her behavior is normal. Judgment and thought content normal.        Assessment & Plan:   Problem List Items Addressed This Visit      Unprioritized   Hirsutism    Started  on levonorgestrel-ethinyl estradiol 0.1-20mg . Continue spironolactone .      Relevant Medications   levonorgestrel-ethinyl estradiol (AVIANE,ALESSE,LESSINA) 0.1-20 MG-MCG tablet   PCOS (polycystic ovarian syndrome)    Started on levonorgestrel-ethinyl estradiol 0.1-20mg . Continue spironolactone .       Other Visit Diagnoses    Medication monitoring encounter    -  Primary    Relevant Orders     Comprehensive metabolic panel        Follow up plan: Return in about 3 months (around 04/08/2015).

## 2015-01-08 LAB — COMPREHENSIVE METABOLIC PANEL
ALT: 15 IU/L (ref 0–32)
AST: 14 IU/L (ref 0–40)
Albumin/Globulin Ratio: 1.7 (ref 1.1–2.5)
Albumin: 4.2 g/dL (ref 3.5–5.5)
Alkaline Phosphatase: 100 IU/L (ref 39–117)
BUN/Creatinine Ratio: 13 (ref 8–20)
BUN: 10 mg/dL (ref 6–20)
Bilirubin Total: 0.4 mg/dL (ref 0.0–1.2)
CO2: 24 mmol/L (ref 18–29)
Calcium: 9.5 mg/dL (ref 8.7–10.2)
Chloride: 100 mmol/L (ref 97–108)
Creatinine, Ser: 0.75 mg/dL (ref 0.57–1.00)
GFR calc Af Amer: 124 mL/min/{1.73_m2} (ref >59)
GFR calc non Af Amer: 107 mL/min/{1.73_m2} (ref >59)
Globulin, Total: 2.5 g/dL (ref 1.5–4.5)
Glucose: 96 mg/dL (ref 65–99)
Potassium: 4.4 mmol/L (ref 3.5–5.2)
Sodium: 142 mmol/L (ref 134–144)
Total Protein: 6.7 g/dL (ref 6.0–8.5)

## 2015-04-13 ENCOUNTER — Telehealth: Payer: Self-pay

## 2015-04-13 MED ORDER — SPIRONOLACTONE 50 MG PO TABS: 50.0000 mg | ORAL_TABLET | Freq: Every day | ORAL | Status: DC

## 2015-04-13 NOTE — Telephone Encounter (Signed)
OK.  Sent in an rx for 50 mgs.  Please let her know she can use topical rogaine as well that is OTC in [facility] to [facility]

## 2015-04-13 NOTE — Telephone Encounter (Signed)
She wants to know if the Spiralactone could be increased. She states the hair growth is slowing down, but would like to see better results.

## 2015-04-13 NOTE — Telephone Encounter (Signed)
Patient notified

## 2015-04-25 ENCOUNTER — Ambulatory Visit: Payer: 59 | Admitting: Unknown Physician Specialty

## 2015-06-30 ENCOUNTER — Other Ambulatory Visit: Payer: Self-pay

## 2015-06-30 MED ORDER — VALACYCLOVIR HCL 500 MG PO TABS: 500.0000 mg | ORAL_TABLET | Freq: Two times a day (BID) | ORAL | Status: DC

## 2015-06-30 NOTE — Telephone Encounter (Signed)
Patient called stating she needed a refill on her Valtrex. Patient stated she only gets this medication when needed, right when she gets a cold sore. She only needs a couple times a year patient stated. Patient said that she switched pharmacies, so they don't have the medication on file.   [facility] [place] [place] Request for Valtrex 500 mg tablet.

## 2015-08-19 ENCOUNTER — Ambulatory Visit: Payer: Self-pay | Admitting: Unknown Physician Specialty

## 2015-08-31 ENCOUNTER — Ambulatory Visit (INDEPENDENT_AMBULATORY_CARE_PROVIDER_SITE_OTHER): Payer: Commercial Managed Care - HMO | Admitting: Unknown Physician Specialty

## 2015-08-31 ENCOUNTER — Telehealth: Payer: Self-pay | Admitting: Unknown Physician Specialty

## 2015-08-31 ENCOUNTER — Encounter: Payer: Self-pay | Admitting: Unknown Physician Specialty

## 2015-08-31 VITALS — BP 141/85 | HR 78 | Temp 98.0°F | Ht 61.5 in | Wt 243.8 lb

## 2015-08-31 DIAGNOSIS — R1031 Right lower quadrant pain: Secondary | ICD-10-CM

## 2015-08-31 DIAGNOSIS — R1011 Right upper quadrant pain: Secondary | ICD-10-CM

## 2015-08-31 DIAGNOSIS — R309 Painful micturition, unspecified: Secondary | ICD-10-CM

## 2015-08-31 DIAGNOSIS — L680 Hirsutism: Secondary | ICD-10-CM

## 2015-08-31 LAB — UA/M W/RFLX CULTURE, ROUTINE
Bilirubin, UA: NEGATIVE
Glucose, UA: NEGATIVE
Ketones, UA: NEGATIVE
Leukocytes, UA: NEGATIVE
Nitrite, UA: NEGATIVE
RBC, UA: NEGATIVE
Specific Gravity, UA: 1.025 (ref 1.005–1.030)
Urobilinogen, Ur: 0.2 mg/dL (ref 0.2–1.0)
pH, UA: 5.5 (ref 5.0–7.5)

## 2015-08-31 MED ORDER — SPIRONOLACTONE 50 MG PO TABS: 50.0000 mg | ORAL_TABLET | Freq: Two times a day (BID) | ORAL | Status: DC

## 2015-08-31 NOTE — Telephone Encounter (Signed)
Routing to provider. Does patient need an appointment? 

## 2015-08-31 NOTE — Telephone Encounter (Signed)
[name], please call this patient and see if she can come in at 2:45 today. If she cane, please add her to our schedule.

## 2015-08-31 NOTE — Telephone Encounter (Signed)
Yes she does.  I do know about this problem and it is recurrent and not clear if related to kidney stones.  You can put her in my schedule somewhere if OK with [place]

## 2015-08-31 NOTE — Telephone Encounter (Signed)
Pt called and thinks she may have kidney stones and would like to do a urine. She does not want pain meds just wants to know for sure that that's what's going on.

## 2015-08-31 NOTE — Telephone Encounter (Signed)
Pt contacted and added to Cheryl's schedule for today @ 2:45pm. Thanks.

## 2015-08-31 NOTE — Assessment & Plan Note (Signed)
OK to increase Spironalactone to BID but will need to see back in 3 months to check K levels

## 2015-08-31 NOTE — Progress Notes (Signed)
BP 141/85 mmHg  Pulse 78  Temp(Src) 98 F (36.7 C)  Ht 5' 1.5" (1.562 m)  Wt 243 lb 12.8 oz (110.587 kg)  BMI 45.33 kg/m2  SpO2 100%  LMP 08/05/2015 (Exact Date)   Subjective:    Patient ID: [name], female    DOB: 28-Aug-1985, 30 y.o.   MRN: [id number]  HPI: [name] is a 30 y.o. female  Chief Complaint  Patient presents with  . kidney issue    pt states she thinks she has a kidney infection  . Medication Refill    pt states she would like spironolactone increased   Abdominal pain Pt having left flank pain for about 6 weeks.  She states it is continually getting worse.  States it feels like "a fist" and spasms.  It is always there but intensity comes and goes.  Sometime it is a 4/10 and sometimes 10/10.  States the intense pain can last for 30 minutes.  Nauseated just yesterday.  Last year when she was having pain it was thought it was related to a stone and wonders if she has one now.  No trouble with bowels  [place] and hepatobiliary scan was normal last year  Hirsutism Would like an increase of Spironalactone.  She tried BCPs and didn't do well  Relevant past medical, surgical, family and social history reviewed and updated as indicated. Interim medical history since our last visit reviewed. Allergies and medications reviewed and updated.  Review of Systems  Per HPI unless specifically indicated above     Objective:    BP 141/85 mmHg  Pulse 78  Temp(Src) 98 F (36.7 C)  Ht 5' 1.5" (1.562 m)  Wt 243 lb 12.8 oz (110.587 kg)  BMI 45.33 kg/m2  SpO2 100%  LMP 08/05/2015 (Exact Date)  Wt Readings from Last 3 Encounters:  08/31/15 243 lb 12.8 oz (110.587 kg)  01/07/15 251 lb 12.8 oz (114.216 kg)  12/15/14 252 lb (114.306 kg)    Physical Exam  Constitutional: She is oriented to person, place, and time. She appears well-developed and well-nourished. No distress.  HENT:  Head: Normocephalic and atraumatic.  Eyes: Conjunctivae and lids are  normal. Right eye exhibits no discharge. Left eye exhibits no discharge. No scleral icterus.  Neck: Normal range of motion. Neck supple. No JVD present. Carotid bruit is not present.  Cardiovascular: Normal rate, regular rhythm and normal heart sounds.   Pulmonary/Chest: Effort normal and breath sounds normal.  Abdominal: Normal appearance. There is no splenomegaly or hepatomegaly. There is tenderness in the right upper quadrant and right lower quadrant. There is no rebound and no guarding.  Musculoskeletal: Normal range of motion.  Neurological: She is alert and oriented to person, place, and time.  Skin: Skin is warm, dry and intact. No rash noted. No pallor.  Psychiatric: She has a normal mood and affect. Her behavior is normal. Judgment and thought content normal.    Results for orders placed or performed in visit on 01/07/15  Comprehensive metabolic panel  Result Value Ref Range   Glucose 96 65 - 99 mg/dL   BUN 10 6 - 20 mg/dL   Creatinine, Ser 4.09 0.57 - 1.00 mg/dL   GFR calc non Af Amer 107 >59 mL/min/1.73   GFR calc Af Amer 124 >59 mL/min/1.73   BUN/Creatinine Ratio 13 8 - 20   Sodium 142 134 - 144 mmol/L   Potassium 4.4 3.5 - 5.2 mmol/L   Chloride 100 97 - 108  mmol/L   CO2 24 18 - 29 mmol/L   Calcium 9.5 8.7 - 10.2 mg/dL   Total Protein 6.7 6.0 - 8.5 g/dL   Albumin 4.2 3.5 - 5.5 g/dL   Globulin, Total 2.5 1.5 - 4.5 g/dL   Albumin/Globulin Ratio 1.7 1.1 - 2.5   Bilirubin Total 0.4 0.0 - 1.2 mg/dL   Alkaline Phosphatase 100 39 - 117 IU/L   AST 14 0 - 40 IU/L   ALT 15 0 - 32 IU/L      Assessment & Plan:   Problem List Items Addressed This Visit      Unprioritized   Hirsutism    OK to increase Spironalactone to BID but will need to see back in 3 months to check K levels       Other Visit Diagnoses    Pain with urination    -  Primary    Relevant Orders    UA/M w/rflx Culture, Routine    Continuous RLQ abdominal pain        Relevant Orders    Comprehensive  metabolic panel    CBC with Differential/Platelet    Amylase    Lipase    US Abdomen Complete    RUQ pain        Relevant Orders    CBC with Differential/Platelet    Amylase    Lipase    US Abdomen Complete       Will order labs and an abdominal US.  If that is negative, consider an abdominal CT.    Follow up plan: Return in about 3 months (around 12/01/2015).

## 2015-09-01 ENCOUNTER — Telehealth: Payer: Self-pay

## 2015-09-01 LAB — COMPREHENSIVE METABOLIC PANEL
ALT: 14 IU/L (ref 0–32)
AST: 13 IU/L (ref 0–40)
Albumin/Globulin Ratio: 1.9 (ref 1.2–2.2)
Albumin: 4.3 g/dL (ref 3.5–5.5)
Alkaline Phosphatase: 90 IU/L (ref 39–117)
BUN/Creatinine Ratio: 14 (ref 9–23)
BUN: 10 mg/dL (ref 6–20)
Bilirubin Total: 0.4 mg/dL (ref 0.0–1.2)
CO2: 21 mmol/L (ref 18–29)
Calcium: 9 mg/dL (ref 8.7–10.2)
Chloride: 103 mmol/L (ref 96–106)
Creatinine, Ser: 0.74 mg/dL (ref 0.57–1.00)
GFR calc Af Amer: 125 mL/min/{1.73_m2} (ref >59)
GFR calc non Af Amer: 108 mL/min/{1.73_m2} (ref >59)
Globulin, Total: 2.3 g/dL (ref 1.5–4.5)
Glucose: 92 mg/dL (ref 65–99)
Potassium: 4 mmol/L (ref 3.5–5.2)
Sodium: 141 mmol/L (ref 134–144)
Total Protein: 6.6 g/dL (ref 6.0–8.5)

## 2015-09-01 LAB — CBC WITH DIFFERENTIAL/PLATELET
Basophils Absolute: 0 10*3/uL (ref 0.0–0.2)
Basos: 0 %
EOS (ABSOLUTE): 0.1 10*3/uL (ref 0.0–0.4)
Eos: 2 %
Hematocrit: 38.5 % (ref 34.0–46.6)
Hemoglobin: 13.1 g/dL (ref 11.1–15.9)
Immature Grans (Abs): 0 10*3/uL (ref 0.0–0.1)
Immature Granulocytes: 0 %
Lymphocytes Absolute: 3.2 10*3/uL — ABNORMAL HIGH (ref 0.7–3.1)
Lymphs: 39 %
MCH: 30.3 pg (ref 26.6–33.0)
MCHC: 34 g/dL (ref 31.5–35.7)
MCV: 89 fL (ref 79–97)
Monocytes Absolute: 0.4 10*3/uL (ref 0.1–0.9)
Monocytes: 5 %
Neutrophils Absolute: 4.4 10*3/uL (ref 1.4–7.0)
Neutrophils: 54 %
Platelets: 335 10*3/uL (ref 150–379)
RBC: 4.32 x10E6/uL (ref 3.77–5.28)
RDW: 13.2 % (ref 12.3–15.4)
WBC: 8.1 10*3/uL (ref 3.4–10.8)

## 2015-09-01 LAB — LIPASE: Lipase: 37 U/L (ref 0–59)

## 2015-09-01 LAB — AMYLASE: Amylase: 52 U/L (ref 31–124)

## 2015-09-01 NOTE — Telephone Encounter (Signed)
Patient called in asking about when her ultrasound was supposed to be. I told the patient that I did not do referrals and that I would ask the person who does them about it. So I asked Lorina RabonKeri and she stated that it was sent to the outpatient imaging center and that she would call them and get it scheduled. I left Keri know that patient states she is in a lot more pain than yesterday and wants to go ASAP. Lorina RabonKeri stated that she called and the soonest they could get her in would be Tuesday 09/06/15. Patient asked why it would be so long, and I explained to her that the provider enters the referral based on when she was seen yesterday. Patient stated that the pain got 10 times worse over night and said she would just go to the ER because she feels like she cannot wait until Tuesday.

## 2015-09-06 ENCOUNTER — Ambulatory Visit
Admission: RE | Admit: 2015-09-06 | Discharge: 2015-09-06 | Disposition: A | Discharge status: Home / Self Care | Payer: Commercial Managed Care - HMO | Source: Ambulatory Visit | Attending: Unknown Physician Specialty | Admitting: Unknown Physician Specialty

## 2015-09-06 ENCOUNTER — Telehealth: Payer: Self-pay | Admitting: Unknown Physician Specialty

## 2015-09-06 DIAGNOSIS — E282 Polycystic ovarian syndrome: Secondary | ICD-10-CM

## 2015-09-06 DIAGNOSIS — D734 Cyst of spleen: Secondary | ICD-10-CM | POA: Diagnosis not present

## 2015-09-06 DIAGNOSIS — R932 Abnormal findings on diagnostic imaging of liver and biliary tract: Secondary | ICD-10-CM | POA: Insufficient documentation

## 2015-09-06 DIAGNOSIS — R1031 Right lower quadrant pain: Secondary | ICD-10-CM | POA: Diagnosis not present

## 2015-09-06 DIAGNOSIS — R1011 Right upper quadrant pain: Secondary | ICD-10-CM | POA: Diagnosis not present

## 2015-09-06 NOTE — Telephone Encounter (Signed)
Reviewed abdominal US.  She states she is hardly eating at this time.  Will refer to GI for further evaluation.

## 2015-09-07 ENCOUNTER — Other Ambulatory Visit: Payer: Self-pay | Admitting: Unknown Physician Specialty

## 2015-09-07 ENCOUNTER — Telehealth: Payer: Self-pay | Admitting: Gastroenterology

## 2015-09-07 DIAGNOSIS — E282 Polycystic ovarian syndrome: Secondary | ICD-10-CM

## 2015-09-07 DIAGNOSIS — R1031 Right lower quadrant pain: Secondary | ICD-10-CM

## 2015-09-07 NOTE — Telephone Encounter (Signed)
Can see an APP next available.  Can use an afternoon or late morning spot.

## 2015-09-07 NOTE — Telephone Encounter (Signed)
Appointment scheduled with Mike GipAmy Esterwood for 09/12/15 @ 2:30pm

## 2015-09-08 ENCOUNTER — Telehealth: Payer: Self-pay

## 2015-09-08 NOTE — Telephone Encounter (Signed)
I got patient into [facility] on Monday 09/12/2015. Patient canceled appointment. I called patient and she said she wants to see Dr. Elnoria HowardHung because they know him. Patient has an appointment with him set up on Monday 09/12/2015 at [facility].

## 2015-09-08 NOTE — Telephone Encounter (Signed)
Pt called stated she wants to update her referral. Stated she would like to go to [facility] to see Dr. Renaldo ReelHuang because she is able to get an appointment there quicker. Pt has an appt there on Monday. Thanks.

## 2015-09-12 ENCOUNTER — Ambulatory Visit: Payer: Managed Care, Other (non HMO) | Admitting: Physician Assistant

## 2015-09-13 ENCOUNTER — Ambulatory Visit
Admission: RE | Admit: 2015-09-13 | Discharge: 2015-09-13 | Disposition: A | Discharge status: Home / Self Care | Payer: Commercial Managed Care - HMO | Source: Ambulatory Visit | Attending: Unknown Physician Specialty | Admitting: Unknown Physician Specialty

## 2015-09-13 DIAGNOSIS — E282 Polycystic ovarian syndrome: Secondary | ICD-10-CM | POA: Diagnosis present

## 2015-09-13 DIAGNOSIS — R1031 Right lower quadrant pain: Secondary | ICD-10-CM | POA: Diagnosis present

## 2015-09-22 ENCOUNTER — Other Ambulatory Visit: Payer: Self-pay | Admitting: Unknown Physician Specialty

## 2015-09-27 ENCOUNTER — Ambulatory Visit (INDEPENDENT_AMBULATORY_CARE_PROVIDER_SITE_OTHER): Payer: Commercial Managed Care - HMO | Admitting: Unknown Physician Specialty

## 2015-09-27 ENCOUNTER — Encounter: Payer: Self-pay | Admitting: Unknown Physician Specialty

## 2015-09-27 VITALS — BP 129/86 | HR 78 | Temp 98.2°F | Ht 61.7 in | Wt 244.6 lb

## 2015-09-27 DIAGNOSIS — L259 Unspecified contact dermatitis, unspecified cause: Secondary | ICD-10-CM

## 2015-09-27 MED ORDER — CLOTRIMAZOLE-BETAMETHASONE 1-0.05 % EX CREA: 1.0000 "application " | TOPICAL_CREAM | Freq: Two times a day (BID) | CUTANEOUS | Status: DC

## 2015-09-27 NOTE — Progress Notes (Signed)
BP 129/86 mmHg  Pulse 78  Temp(Src) 98.2 F (36.8 C)  Ht 5' 1.7" (1.567 m)  Wt 244 lb 9.6 oz (110.95 kg)  BMI 45.18 kg/m2  SpO2 100%  LMP 09/05/2015 (Exact Date)   Subjective:    Patient ID: [name], female    DOB: 04/02/1986, 30 y.o.   MRN: [id number]  HPI: [name] is a 30 y.o. female  Chief Complaint  Patient presents with  . Rash    pt states she has a rash on the back of her neck that came up 2 weeks ago. States she has tried things to make it go away but it will not   Abdominal pain Seeing GI and is getting a work up and improving.  Diagnosed with esophogitis and a hiatal hernia.    Rash States she had a rash 2 weeks ago on the back of her neck.  She has used Benadryl and Hydrocortisone cream and it is getting worse.  It itches badly and now starting to hurt.  Nothing has helped.    Relevant past medical, surgical, family and social history reviewed and updated as indicated. Interim medical history since our last visit reviewed. Allergies and medications reviewed and updated.  Review of Systems  Per HPI unless specifically indicated above     Objective:    BP 129/86 mmHg  Pulse 78  Temp(Src) 98.2 F (36.8 C)  Ht 5' 1.7" (1.567 m)  Wt 244 lb 9.6 oz (110.95 kg)  BMI 45.18 kg/m2  SpO2 100%  LMP 09/05/2015 (Exact Date)  Wt Readings from Last 3 Encounters:  09/27/15 244 lb 9.6 oz (110.95 kg)  08/31/15 243 lb 12.8 oz (110.587 kg)  01/07/15 251 lb 12.8 oz (114.216 kg)    Physical Exam  Results for orders placed or performed in visit on 08/31/15  UA/M w/rflx Culture, Routine  Result Value Ref Range   Specific Gravity, UA 1.025 1.005 - 1.030   pH, UA 5.5 5.0 - 7.5   Color, UA Yellow Yellow   Appearance Ur Cloudy (A) Clear   Leukocytes, UA Negative Negative   Protein, UA Trace Negative/Trace   Glucose, UA Negative Negative   Ketones, UA Negative Negative   RBC, UA Negative Negative   Bilirubin, UA Negative Negative   Urobilinogen, Ur 0.2 0.2 - 1.0 mg/dL   Nitrite, UA Negative Negative  Comprehensive metabolic panel  Result Value Ref Range   Glucose 92 65 - 99 mg/dL   BUN 10 6 - 20 mg/dL   Creatinine, Ser 4.090.74 0.57 - 1.00 mg/dL   GFR calc non Af Amer 108 >59 mL/min/1.73   GFR calc Af Amer 125 >59 mL/min/1.73   BUN/Creatinine Ratio 14 9 - 23   Sodium 141 134 - 144 mmol/L   Potassium 4.0 3.5 - 5.2 mmol/L   Chloride 103 96 - 106 mmol/L   CO2 21 18 - 29 mmol/L   Calcium 9.0 8.7 - 10.2 mg/dL   Total Protein 6.6 6.0 - 8.5 g/dL   Albumin 4.3 3.5 - 5.5 g/dL   Globulin, Total 2.3 1.5 - 4.5 g/dL   Albumin/Globulin Ratio 1.9 1.2 - 2.2   Bilirubin Total 0.4 0.0 - 1.2 mg/dL   Alkaline Phosphatase 90 39 - 117 IU/L   AST 13 0 - 40 IU/L   ALT 14 0 - 32 IU/L  CBC with Differential/Platelet  Result Value Ref Range   WBC 8.1 3.4 - 10.8 x10E3/uL   RBC 4.32 3.77 -  5.28 x10E6/uL   Hemoglobin 13.1 11.1 - 15.9 g/dL   Hematocrit 78.4 69.6 - 46.6 %   MCV 89 79 - 97 fL   MCH 30.3 26.6 - 33.0 pg   MCHC 34.0 31.5 - 35.7 g/dL   RDW 29.5 28.4 - 13.2 %   Platelets 335 150 - 379 x10E3/uL   Neutrophils 54 %   Lymphs 39 %   Monocytes 5 %   Eos 2 %   Basos 0 %   Neutrophils Absolute 4.4 1.4 - 7.0 x10E3/uL   Lymphocytes Absolute 3.2 (H) 0.7 - 3.1 x10E3/uL   Monocytes Absolute 0.4 0.1 - 0.9 x10E3/uL   EOS (ABSOLUTE) 0.1 0.0 - 0.4 x10E3/uL   Basophils Absolute 0.0 0.0 - 0.2 x10E3/uL   Immature Granulocytes 0 %   Immature Grans (Abs) 0.0 0.0 - 0.1 x10E3/uL  Amylase  Result Value Ref Range   Amylase 52 31 - 124 U/L  Lipase  Result Value Ref Range   Lipase 37 0 - 59 U/L      Assessment & Plan:   Problem List Items Addressed This Visit      Unprioritized   Contact dermatitis - Primary       Follow up plan: No Follow-up on file.

## 2015-12-02 ENCOUNTER — Ambulatory Visit: Payer: Commercial Managed Care - HMO | Admitting: Unknown Physician Specialty

## 2015-12-30 ENCOUNTER — Ambulatory Visit: Payer: Commercial Managed Care - HMO | Admitting: Unknown Physician Specialty

## 2016-01-23 ENCOUNTER — Other Ambulatory Visit: Payer: Self-pay | Admitting: Unknown Physician Specialty

## 2016-01-27 ENCOUNTER — Telehealth: Payer: Self-pay | Admitting: Unknown Physician Specialty

## 2016-01-27 MED ORDER — SPIRONOLACTONE 50 MG PO TABS: 50.0000 mg | ORAL_TABLET | Freq: Every day | ORAL | 0 refills | Status: DC

## 2016-01-27 NOTE — Telephone Encounter (Signed)
Pt has ran out of meds and was denied a refill. She has scheduled an appt  and would like to know if she could get a refill for spironolactone (ALDACTONE) 50 MG tablet sent to [facility] [place].

## 2016-01-27 NOTE — Telephone Encounter (Signed)
Your patient 

## 2016-01-27 NOTE — Telephone Encounter (Signed)
Routing to provider  

## 2016-01-31 ENCOUNTER — Ambulatory Visit (INDEPENDENT_AMBULATORY_CARE_PROVIDER_SITE_OTHER): Payer: Commercial Managed Care - HMO | Admitting: Unknown Physician Specialty

## 2016-01-31 ENCOUNTER — Encounter: Payer: Self-pay | Admitting: Unknown Physician Specialty

## 2016-01-31 DIAGNOSIS — L680 Hirsutism: Secondary | ICD-10-CM

## 2016-01-31 MED ORDER — EFLORNITHINE HCL 13.9 % EX CREA: TOPICAL_CREAM | CUTANEOUS | 12 refills | Status: DC

## 2016-01-31 MED ORDER — SPIRONOLACTONE 50 MG PO TABS: 50.0000 mg | ORAL_TABLET | Freq: Every day | ORAL | 2 refills | Status: DC

## 2016-01-31 NOTE — Assessment & Plan Note (Signed)
Wrote rx for [employer].  If insurance covers it, we will stick with Spironalactone 50 mg BID.  If they don't increase to 50 mg TID.  Check CMP now and in one month

## 2016-01-31 NOTE — Progress Notes (Signed)
   BP 112/72 (BP Location: Left Arm, Patient Position: Sitting, Cuff Size: Large)   Pulse 71   Temp 98.2 F (36.8 C)   Ht 5' 1.6" (1.565 m)   Wt 246 lb 6.4 oz (111.8 kg)   LMP 01/31/2016   SpO2 98%   BMI 45.65 kg/m    Subjective:    Patient ID: [name], female    DOB: 07/15/1985, 30 y.o.   MRN: [id number]  HPI: [name] is a 30 y.o. female  Chief Complaint  Patient presents with  . Medication Follow Up    spironlactone   Hirsutism Would like an increase of Spironalactone.  She tried BCPs and didn't do well.  She is currently taking 50 mg twice a day of Spironalactone.  She states it is working but feels like she wants it to work better.  Pt is aware this is an off label use.    Relevant past medical, surgical, family and social history reviewed and updated as indicated. Interim medical history since our last visit reviewed. Allergies and medications reviewed and updated.  Review of Systems  Per HPI unless specifically indicated above     Objective:    BP 112/72 (BP Location: Left Arm, Patient Position: Sitting, Cuff Size: Large)   Pulse 71   Temp 98.2 F (36.8 C)   Ht 5' 1.6" (1.565 m)   Wt 246 lb 6.4 oz (111.8 kg)   LMP 01/31/2016   SpO2 98%   BMI 45.65 kg/m   Wt Readings from Last 3 Encounters:  01/31/16 246 lb 6.4 oz (111.8 kg)  09/27/15 244 lb 9.6 oz (110.9 kg)  08/31/15 243 lb 12.8 oz (110.6 kg)    Physical Exam  Constitutional: She is oriented to person, place, and time. She appears well-developed and well-nourished. No distress.  HENT:  Head: Normocephalic and atraumatic.  Eyes: Conjunctivae and lids are normal. Right eye exhibits no discharge. Left eye exhibits no discharge. No scleral icterus.  Neck: Normal range of motion. Neck supple. No JVD present. Carotid bruit is not present.  Cardiovascular: Normal rate, regular rhythm and normal heart sounds.   Pulmonary/Chest: Effort normal and breath sounds normal.  Abdominal: Normal  appearance. There is no splenomegaly or hepatomegaly.  Musculoskeletal: Normal range of motion.  Neurological: She is alert and oriented to person, place, and time.  Skin: Skin is warm, dry and intact. No rash noted. No pallor.  Psychiatric: She has a normal mood and affect. Her behavior is normal. Judgment and thought content normal.   Reviewed accepted dosage in [facility] indicating dose can be increased to 200 mg in divided doses.      Assessment & Plan:   Problem List Items Addressed This Visit      Unprioritized   Hirsutism    Wrote rx for [employer].  If insurance covers it, we will stick with Spironalactone 50 mg BID.  If they don't increase to 50 mg TID.  Check CMP now and in one month      Relevant Orders   Comprehensive metabolic panel   Comprehensive metabolic panel    Other Visit Diagnoses   None.      Follow up plan: Return in about 6 months (around 07/31/2016).

## 2016-02-01 LAB — COMPREHENSIVE METABOLIC PANEL
ALT: 14 IU/L (ref 0–32)
AST: 12 IU/L (ref 0–40)
Albumin/Globulin Ratio: 1.9 (ref 1.2–2.2)
Albumin: 4.5 g/dL (ref 3.5–5.5)
Alkaline Phosphatase: 99 IU/L (ref 39–117)
BUN/Creatinine Ratio: 11 (ref 9–23)
BUN: 10 mg/dL (ref 6–20)
Bilirubin Total: 0.5 mg/dL (ref 0.0–1.2)
CO2: 25 mmol/L (ref 18–29)
Calcium: 9.8 mg/dL (ref 8.7–10.2)
Chloride: 100 mmol/L (ref 96–106)
Creatinine, Ser: 0.91 mg/dL (ref 0.57–1.00)
GFR calc Af Amer: 97 mL/min/{1.73_m2} (ref >59)
GFR calc non Af Amer: 84 mL/min/{1.73_m2} (ref >59)
Globulin, Total: 2.4 g/dL (ref 1.5–4.5)
Glucose: 100 mg/dL — ABNORMAL HIGH (ref 65–99)
Potassium: 5.2 mmol/L (ref 3.5–5.2)
Sodium: 140 mmol/L (ref 134–144)
Total Protein: 6.9 g/dL (ref 6.0–8.5)

## 2016-02-01 NOTE — Progress Notes (Signed)
Notified pt by mychart

## 2016-02-03 ENCOUNTER — Telehealth: Payer: Self-pay | Admitting: Unknown Physician Specialty

## 2016-02-03 NOTE — Telephone Encounter (Signed)
Called pharmacy to call in prescription. They state that her insurance will not pay for this medication until 02/19/16 because patient has just recently picked up the 25 mg tablets. [name] is it OK for the patient to keep taking the 25 mg tablets until she can pick up the 50 mg tablets?

## 2016-02-03 NOTE — Telephone Encounter (Signed)
Pt called stated pharmacy has not received RX for Spironolactone. Can this please be sent again. [facility] [place]. Thanks.

## 2016-02-03 NOTE — Telephone Encounter (Signed)
Called and let patient know about medications.

## 2016-02-03 NOTE — Telephone Encounter (Signed)
Yes.  Thank you.

## 2016-02-09 ENCOUNTER — Telehealth: Payer: Self-pay | Admitting: Unknown Physician Specialty

## 2016-02-09 NOTE — Telephone Encounter (Signed)
Pt called stating that her medication was filled wrong and that she is suppose to be taking 50mg  3 times a day and now she is out of medication. She would like for Elnita MaxwellCheryl to call her because is out of medicine completely.  The medication is spironolactone (ALDACTONE) 50 MG tablet

## 2016-02-10 MED ORDER — SPIRONOLACTONE 50 MG PO TABS: 50.0000 mg | ORAL_TABLET | Freq: Three times a day (TID) | ORAL | 2 refills | Status: DC

## 2016-02-10 NOTE — Telephone Encounter (Signed)
Routing to provider. I do not see where the rx says 3 times daily. If this is correct can we send in a new rx?

## 2016-02-10 NOTE — Telephone Encounter (Signed)
Called and left patient a VM letting her know that her medication was sent in with the correct directions and quantity.

## 2016-03-05 ENCOUNTER — Other Ambulatory Visit: Payer: Commercial Managed Care - HMO

## 2016-03-05 ENCOUNTER — Other Ambulatory Visit: Payer: Self-pay | Admitting: Unknown Physician Specialty

## 2016-03-05 DIAGNOSIS — Z5181 Encounter for therapeutic drug level monitoring: Secondary | ICD-10-CM

## 2016-03-05 DIAGNOSIS — E282 Polycystic ovarian syndrome: Secondary | ICD-10-CM

## 2016-03-06 LAB — COMPREHENSIVE METABOLIC PANEL
ALT: 17 IU/L (ref 0–32)
AST: 12 IU/L (ref 0–40)
Albumin/Globulin Ratio: 1.9 (ref 1.2–2.2)
Albumin: 4.5 g/dL (ref 3.5–5.5)
Alkaline Phosphatase: 97 IU/L (ref 39–117)
BUN/Creatinine Ratio: 8 — ABNORMAL LOW (ref 9–23)
BUN: 7 mg/dL (ref 6–20)
Bilirubin Total: 0.3 mg/dL (ref 0.0–1.2)
CO2: 24 mmol/L (ref 18–29)
Calcium: 9.5 mg/dL (ref 8.7–10.2)
Chloride: 100 mmol/L (ref 96–106)
Creatinine, Ser: 0.85 mg/dL (ref 0.57–1.00)
GFR calc Af Amer: 105 mL/min/{1.73_m2} (ref >59)
GFR calc non Af Amer: 91 mL/min/{1.73_m2} (ref >59)
Globulin, Total: 2.4 g/dL (ref 1.5–4.5)
Glucose: 72 mg/dL (ref 65–99)
Potassium: 4.6 mmol/L (ref 3.5–5.2)
Sodium: 140 mmol/L (ref 134–144)
Total Protein: 6.9 g/dL (ref 6.0–8.5)

## 2016-03-06 NOTE — Progress Notes (Signed)
Normal labs.  Pt notified through mychart

## 2016-03-26 ENCOUNTER — Telehealth: Payer: Self-pay | Admitting: Unknown Physician Specialty

## 2016-03-26 NOTE — Telephone Encounter (Signed)
Routing to provider  

## 2016-03-26 NOTE — Telephone Encounter (Signed)
Patient said her medication was increased about a month and a half Spironolactone 50mg  3 xs daily and within the past 2 weeks she has had increased skin dryness on her face in which today just smiling cause her facial skin to actually crack.  Thank You  Clydie BraunKaren

## 2016-03-27 NOTE — Telephone Encounter (Signed)
Called and let patient know what Cheryl said.  

## 2016-03-27 NOTE — Telephone Encounter (Signed)
It would be fine to cut back to her previous dose

## 2016-05-09 DIAGNOSIS — H169 Unspecified keratitis: Secondary | ICD-10-CM | POA: Diagnosis not present

## 2016-06-08 ENCOUNTER — Other Ambulatory Visit: Payer: Self-pay | Admitting: Unknown Physician Specialty

## 2016-06-10 ENCOUNTER — Other Ambulatory Visit: Payer: Self-pay | Admitting: Unknown Physician Specialty

## 2016-07-31 ENCOUNTER — Ambulatory Visit: Payer: Commercial Managed Care - HMO | Admitting: Unknown Physician Specialty

## 2016-08-08 DIAGNOSIS — Z713 Dietary counseling and surveillance: Secondary | ICD-10-CM | POA: Diagnosis not present

## 2016-09-05 DIAGNOSIS — Z87898 Personal history of other specified conditions: Secondary | ICD-10-CM | POA: Diagnosis not present

## 2016-09-05 DIAGNOSIS — N760 Acute vaginitis: Secondary | ICD-10-CM | POA: Diagnosis not present

## 2016-09-05 DIAGNOSIS — Z01419 Encounter for gynecological examination (general) (routine) without abnormal findings: Secondary | ICD-10-CM | POA: Diagnosis not present

## 2016-09-07 LAB — HM PAP SMEAR

## 2016-10-03 DIAGNOSIS — Z8349 Family history of other endocrine, nutritional and metabolic diseases: Secondary | ICD-10-CM | POA: Diagnosis not present

## 2016-10-03 DIAGNOSIS — Z0000 Encounter for general adult medical examination without abnormal findings: Secondary | ICD-10-CM | POA: Diagnosis not present

## 2016-10-03 DIAGNOSIS — R5383 Other fatigue: Secondary | ICD-10-CM | POA: Diagnosis not present

## 2016-10-07 ENCOUNTER — Other Ambulatory Visit: Payer: Self-pay | Admitting: Family Medicine

## 2016-10-08 NOTE — Telephone Encounter (Signed)
apt 

## 2016-10-11 ENCOUNTER — Encounter: Payer: Self-pay | Admitting: Unknown Physician Specialty

## 2016-10-22 ENCOUNTER — Other Ambulatory Visit: Payer: Self-pay | Admitting: Family Medicine

## 2016-10-22 NOTE — Telephone Encounter (Signed)
Pt needs check

## 2016-10-22 NOTE — Telephone Encounter (Signed)
Your patient 

## 2016-11-21 DIAGNOSIS — Z713 Dietary counseling and surveillance: Secondary | ICD-10-CM | POA: Diagnosis not present

## 2016-12-22 ENCOUNTER — Other Ambulatory Visit: Payer: Self-pay | Admitting: Unknown Physician Specialty

## 2017-01-31 ENCOUNTER — Other Ambulatory Visit: Payer: Self-pay | Admitting: Unknown Physician Specialty

## 2017-07-31 ENCOUNTER — Encounter: Payer: Self-pay | Admitting: Family Medicine

## 2017-07-31 ENCOUNTER — Ambulatory Visit (INDEPENDENT_AMBULATORY_CARE_PROVIDER_SITE_OTHER): Payer: Self-pay | Admitting: Family Medicine

## 2017-07-31 VITALS — Temp 98.5°F | Wt 212.3 lb

## 2017-07-31 DIAGNOSIS — J069 Acute upper respiratory infection, unspecified: Secondary | ICD-10-CM

## 2017-07-31 LAB — VERITOR FLU A/B WAIVED
Influenza A: NEGATIVE
Influenza B: NEGATIVE

## 2017-07-31 MED ORDER — HYDROCOD POLST-CPM POLST ER 10-8 MG/5ML PO SUER: 5.0000 mL | Freq: Two times a day (BID) | ORAL | 0 refills | Status: DC | PRN

## 2017-07-31 MED ORDER — AZITHROMYCIN 250 MG PO TABS: ORAL_TABLET | ORAL | 0 refills | Status: DC

## 2017-07-31 MED ORDER — BENZONATATE 200 MG PO CAPS: 200.0000 mg | ORAL_CAPSULE | Freq: Three times a day (TID) | ORAL | 0 refills | Status: DC | PRN

## 2017-07-31 NOTE — Progress Notes (Signed)
Temp 98.5 F (36.9 C) (Oral)   Wt 212 lb 4.8 oz (96.3 kg) Comment: Patient taking phentermine  SpO2 99%   BMI 39.34 kg/m    Subjective:    Patient ID: [name], female    DOB: 03/12/1986, 32 y.o.   MRN: [id number]  HPI: [name] is a 32 y.o. female  Chief Complaint  Patient presents with  . Cough    Ongoing for 6 days. [facility] [place]. Tested positive for strep. Given penicillin. Patient states she's only getting worse.  . Nasal Congestion  . Fever  . Hoarse  . Headache  . Fatigue   Watery eyes, hoarseness, fatigue, congestion, sore swollen throat, productive cough x 1 week. Went to UC and tested positive for strep last week, started on penicillin and getting worse since then. Now having constant pounding headache, fevers, body aches, worsening cough. The penicillin has been making her throw up so she's worried that's why it hasn't been working.   Relevant past medical, surgical, family and social history reviewed and updated as indicated. Interim medical history since our last visit reviewed. Allergies and medications reviewed and updated.  Review of Systems  Per HPI unless specifically indicated above     Objective:    Temp 98.5 F (36.9 C) (Oral)   Wt 212 lb 4.8 oz (96.3 kg) Comment: Patient taking phentermine  SpO2 99%   BMI 39.34 kg/m   Wt Readings from Last 3 Encounters:  07/31/17 212 lb 4.8 oz (96.3 kg)  01/31/16 246 lb 6.4 oz (111.8 kg)  09/27/15 244 lb 9.6 oz (110.9 kg)    Physical Exam  Constitutional: She is oriented to person, place, and time. She appears well-developed and well-nourished. No distress.  HENT:  Head: Atraumatic.  Oropharynx and nasal mucosa erythematous with thick drainage present  Eyes: Pupils are equal, round, and reactive to light. Conjunctivae and EOM are normal.  Neck: Normal range of motion. Neck supple.  Cardiovascular: Normal rate and regular rhythm.  Pulmonary/Chest: Effort  normal and breath sounds normal. No respiratory distress. She has no wheezes.  Musculoskeletal: Normal range of motion.  Neurological: She is alert and oriented to person, place, and time.  Skin: Skin is warm and dry.  Psychiatric: She has a normal mood and affect. Her behavior is normal.  Nursing note and vitals reviewed.  Results for orders placed or performed in visit on 07/31/17  Rapid Strep Screen ([facility])  Result Value Ref Range   Strep Gp A Ag, IA W/Reflex Negative Negative  Culture, Group A Strep  Result Value Ref Range   Strep A Culture Negative   Veritor Flu A/B Waived  Result Value Ref Range   Influenza A Negative Negative   Influenza B Negative Negative      Assessment & Plan:   Problem List Items Addressed This Visit    None    Visit Diagnoses    Upper respiratory tract infection, unspecified type    -  Primary   D/c PCN d/t side effects, start zpack, tessalon, tussionex. Supportive care and OTC remedies reviewed, as well as sedation precautions. F/u if no better   Relevant Medications   azithromycin (ZITHROMAX) 250 MG tablet   Other Relevant Orders   Rapid Strep Screen ([facility] ONLY) (Completed)   Veritor Flu A/B Waived (Completed)   Culture, Group A Strep (Completed)    Strep neg today, await cx.    Follow  up plan: Return if symptoms worsen or fail to improve.

## 2017-08-03 LAB — CULTURE, GROUP A STREP: Strep A Culture: NEGATIVE

## 2017-08-03 LAB — RAPID STREP SCREEN (MED CTR MEBANE ONLY): Strep Gp A Ag, IA W/Reflex: NEGATIVE

## 2017-08-03 NOTE — Patient Instructions (Signed)
Follow up as needed

## 2017-08-06 ENCOUNTER — Telehealth: Payer: Self-pay | Admitting: Unknown Physician Specialty

## 2017-08-06 MED ORDER — NITROFURANTOIN MONOHYD MACRO 100 MG PO CAPS: 100.0000 mg | ORAL_CAPSULE | Freq: Two times a day (BID) | ORAL | 0 refills | Status: DC

## 2017-08-06 NOTE — Telephone Encounter (Signed)
Patient notified about medication and Cheryl's instructions.  

## 2017-08-06 NOTE — Telephone Encounter (Signed)
Routing to provider  

## 2017-08-06 NOTE — Telephone Encounter (Signed)
Copied from CRM [id number]. Topic: Inquiry >> Aug 06, 2017 11:00 AM Alexander Bergeron B wrote: Reason for CRM: pt called and asked if she is need of giving a urine specimen or getting an antibiotic called for her, pt is trying to avoid a visit since she was just seen b/c pt does not have insurance, call pt to advise

## 2017-08-06 NOTE — Telephone Encounter (Signed)
Pt requesting antibiotic be called in for her symptoms of UTI.  States H/O cystitis, reoccurs after taking antibiotics. Seen 07/26/17 in UC for strep and placed on PCN,  seen 07/31/17 by R. [address] and placed on Z-pack which she completed Sunday. Pain, urgency with urination, no fever or hematuria, denies back/flank pain. States pattern of Cystitis after taking antibiotics. Pt no longer has insurance and is requesting antibiotic be called in for her. Made aware she would probably need to be seen. States she can not afford OV.  Please advise:  [phone]

## 2017-08-06 NOTE — Telephone Encounter (Signed)
I will give her 3 days of Macrobid but needs to be seen if no improvement

## 2017-08-06 NOTE — Telephone Encounter (Signed)
Routing to Brittany

## 2017-12-06 DIAGNOSIS — E669 Obesity, unspecified: Secondary | ICD-10-CM | POA: Diagnosis not present

## 2017-12-06 DIAGNOSIS — E282 Polycystic ovarian syndrome: Secondary | ICD-10-CM | POA: Diagnosis not present

## 2017-12-07 DIAGNOSIS — E669 Obesity, unspecified: Secondary | ICD-10-CM | POA: Insufficient documentation

## 2018-03-17 ENCOUNTER — Encounter: Payer: Self-pay | Admitting: Family Medicine

## 2018-03-17 ENCOUNTER — Ambulatory Visit: Payer: Commercial Managed Care - PPO | Admitting: Family Medicine

## 2018-03-17 VITALS — BP 129/84 | HR 81 | Temp 98.8°F | Ht 63.0 in | Wt 216.9 lb

## 2018-03-17 DIAGNOSIS — J029 Acute pharyngitis, unspecified: Secondary | ICD-10-CM | POA: Diagnosis not present

## 2018-03-17 DIAGNOSIS — J069 Acute upper respiratory infection, unspecified: Secondary | ICD-10-CM

## 2018-03-17 MED ORDER — HYDROCOD POLST-CPM POLST ER 10-8 MG/5ML PO SUER: 5.0000 mL | Freq: Every evening | ORAL | 0 refills | Status: DC | PRN

## 2018-03-17 MED ORDER — PREDNISONE 20 MG PO TABS: 40.0000 mg | ORAL_TABLET | Freq: Every day | ORAL | 0 refills | Status: DC

## 2018-03-17 MED ORDER — BENZONATATE 200 MG PO CAPS: 200.0000 mg | ORAL_CAPSULE | Freq: Three times a day (TID) | ORAL | 0 refills | Status: DC | PRN

## 2018-03-17 NOTE — Progress Notes (Signed)
BP 129/84   Pulse 81   Temp 98.8 F (37.1 C) (Oral)   Ht 5\' 3"  (1.6 m)   Wt 216 lb 14.4 oz (98.4 kg)   SpO2 98%   BMI 38.42 kg/m    Subjective:    Patient ID: [name], female    DOB: May 13, 1985, 32 y.o.   MRN: [id number]  HPI: [name] is a 32 y.o. female  Chief Complaint  Patient presents with  . URI    pt states she has had a sore throat, fever, and chills since yesterday   Started with a scratchy throat around 8 pm last night, woke up in the middle of the night and could barely swallow and now having chills, fever (100.5), body aches, fatigue, congestion. Taking OTC cough and cold medication with mild temporary relief. Hx of allergic rhinitis and frequent sinusitis, not currently on anything for it. Son was sick last week. Denies N/V/D, CP, SOB, cough, facial pain or pressure.   Relevant past medical, surgical, family and social history reviewed and updated as indicated. Interim medical history since our last visit reviewed. Allergies and medications reviewed and updated.  Review of Systems  Per HPI unless specifically indicated above     Objective:    BP 129/84   Pulse 81   Temp 98.8 F (37.1 C) (Oral)   Ht 5\' 3"  (1.6 m)   Wt 216 lb 14.4 oz (98.4 kg)   SpO2 98%   BMI 38.42 kg/m   Wt Readings from Last 3 Encounters:  03/17/18 216 lb 14.4 oz (98.4 kg)  07/31/17 212 lb 4.8 oz (96.3 kg)  01/31/16 246 lb 6.4 oz (111.8 kg)    Physical Exam  Constitutional: She is oriented to person, place, and time. She appears well-developed and well-nourished.  HENT:  Head: Atraumatic.  Right Ear: External ear normal.  Left Ear: External ear normal.  Oropharynx erythematous, non edematous Nasal mucosa erythematous  Eyes: Pupils are equal, round, and reactive to light. Conjunctivae and EOM are normal.  Neck: Normal range of motion. Neck supple.  Cardiovascular: Normal rate, regular rhythm and normal heart sounds.  Pulmonary/Chest: Effort normal and  breath sounds normal. No respiratory distress. She has no wheezes. She has no rales.  Musculoskeletal: Normal range of motion.  Lymphadenopathy:    She has no cervical adenopathy.  Neurological: She is alert and oriented to person, place, and time.  Skin: Skin is warm and dry.  Psychiatric: She has a normal mood and affect. Her behavior is normal.  Nursing note and vitals reviewed.   Results for orders placed or performed in visit on 07/31/17  Rapid Strep Screen ([facility] ONLY)  Result Value Ref Range   Strep Gp A Ag, IA W/Reflex Negative Negative  Culture, Group A Strep  Result Value Ref Range   Strep A Culture Negative   Veritor Flu A/B Waived  Result Value Ref Range   Influenza A Negative Negative   Influenza B Negative Negative      Assessment & Plan:   Problem List Items Addressed This Visit    None    Visit Diagnoses    Viral URI    -  Primary   Rapid flu and strep neg, await strep cx. Tx with prednisone, flonase, humidifier, fluids, rest. Tessalon and tussionex sent in case cough begins.   Relevant Orders   Veritor Flu A/B Waived   Sore throat       Relevant Orders  Rapid Strep Screen ([facility] ONLY)       Follow up plan: Return if symptoms worsen or fail to improve.

## 2018-03-20 LAB — CULTURE, GROUP A STREP

## 2018-03-20 LAB — VERITOR FLU A/B WAIVED
Influenza A: NEGATIVE
Influenza B: NEGATIVE

## 2018-03-20 LAB — RAPID STREP SCREEN (MED CTR MEBANE ONLY): Strep Gp A Ag, IA W/Reflex: NEGATIVE

## 2018-05-28 ENCOUNTER — Encounter: Payer: Self-pay | Admitting: Family Medicine

## 2018-05-28 ENCOUNTER — Ambulatory Visit: Payer: Commercial Managed Care - PPO | Admitting: Family Medicine

## 2018-05-28 VITALS — BP 127/85 | HR 101 | Temp 98.2°F | Ht 63.0 in | Wt 211.0 lb

## 2018-05-28 DIAGNOSIS — S161XXA Strain of muscle, fascia and tendon at neck level, initial encounter: Secondary | ICD-10-CM

## 2018-05-28 MED ORDER — CYCLOBENZAPRINE HCL 10 MG PO TABS: 10.0000 mg | ORAL_TABLET | Freq: Three times a day (TID) | ORAL | 0 refills | Status: DC | PRN

## 2018-05-28 MED ORDER — PREDNISONE 10 MG PO TABS: ORAL_TABLET | ORAL | 0 refills | Status: DC

## 2018-05-28 MED ORDER — KETOROLAC TROMETHAMINE 60 MG/2ML IM SOLN
60.0000 mg | Freq: Once | INTRAMUSCULAR | Status: AC
  Administered 2018-05-28: 60 mg via INTRAMUSCULAR

## 2018-05-28 NOTE — Progress Notes (Signed)
BP 127/85   Pulse (!) 101   Temp 98.2 F (36.8 C) (Oral)   Ht 5\' 3"  (1.6 m)   Wt 211 lb (95.7 kg)   SpO2 100%   BMI 37.38 kg/m    Subjective:    Patient ID: [name], female    DOB: [birth year], 33 y.o.   MRN: [id number]  HPI: [name] is a 33 y.o. female  Chief Complaint  Patient presents with  . Neck Pain    Started 1 mont ago. Worsing now radiating down back to below shoulder blades. Pt orignially believed it was due to stress.    1 month of neck pain worsening. Start b/l neck and now spreading up to bottom of scalp and down below shoulder blades. Has hx of disc issues, was offered surgery 4 years ago and declined. States this seems to feel different, more like tension and muscle related. Denies radiation down arms, numbness tingling into hands. Trying OTC remedies with no relief. No known injury.   Relevant past medical, surgical, family and social history reviewed and updated as indicated. Interim medical history since our last visit reviewed. Allergies and medications reviewed and updated.  Review of Systems  Per HPI unless specifically indicated above     Objective:    BP 127/85   Pulse (!) 101   Temp 98.2 F (36.8 C) (Oral)   Ht 5\' 3"  (1.6 m)   Wt 211 lb (95.7 kg)   SpO2 100%   BMI 37.38 kg/m   Wt Readings from Last 3 Encounters:  05/28/18 211 lb (95.7 kg)  03/17/18 216 lb 14.4 oz (98.4 kg)  07/31/17 212 lb 4.8 oz (96.3 kg)    Physical Exam Vitals signs and nursing note reviewed.  Constitutional:      Appearance: Normal appearance. She is not ill-appearing.  HENT:     Head: Atraumatic.  Eyes:     Extraocular Movements: Extraocular movements intact.     Conjunctiva/sclera: Conjunctivae normal.  Neck:     Musculoskeletal: Normal range of motion and neck supple.  Cardiovascular:     Rate and Rhythm: Normal rate and regular rhythm.     Pulses: Normal pulses.     Heart sounds: Normal heart sounds.  Pulmonary:     Effort:  Pulmonary effort is normal.     Breath sounds: Normal breath sounds.  Musculoskeletal: Normal range of motion.        General: Tenderness (ttp b/l cervical paraspinal muscles) present.  Skin:    General: Skin is warm and dry.  Neurological:     Mental Status: She is alert and oriented to person, place, and time.     Sensory: No sensory deficit.     Motor: No weakness.  Psychiatric:        Mood and Affect: Mood normal.        Thought Content: Thought content normal.        Judgment: Judgment normal.     Results for orders placed or performed in visit on 03/17/18  Rapid Strep Screen ([facility] ONLY)  Result Value Ref Range   Strep Gp A Ag, IA W/Reflex Negative Negative  Culture, Group A Strep  Result Value Ref Range   Strep A Culture Comment (A)   Veritor Flu A/B Waived  Result Value Ref Range   Influenza A Negative Negative   Influenza B Negative Negative      Assessment & Plan:   Problem List Items Addressed This  Visit    None    Visit Diagnoses    Strain of neck muscle, initial encounter    -  Primary   IM toradol given today. Tx with prednisone taper and prn flexeril, massage, heat, stretches. F/u if not improving   Relevant Medications   ketorolac (TORADOL) injection 60 mg (Completed)       Follow up plan: Return if symptoms worsen or fail to improve.

## 2018-09-01 ENCOUNTER — Emergency Department: Payer: Commercial Managed Care - PPO

## 2018-09-01 ENCOUNTER — Other Ambulatory Visit: Payer: Self-pay

## 2018-09-01 ENCOUNTER — Emergency Department
Admission: EM | Admit: 2018-09-01 | Discharge: 2018-09-01 | Disposition: A | Discharge status: Home / Self Care | Payer: Commercial Managed Care - PPO | Attending: Emergency Medicine | Admitting: Emergency Medicine

## 2018-09-01 ENCOUNTER — Encounter: Payer: Self-pay | Admitting: Intensive Care

## 2018-09-01 DIAGNOSIS — Z91040 Latex allergy status: Secondary | ICD-10-CM | POA: Insufficient documentation

## 2018-09-01 DIAGNOSIS — Z79899 Other long term (current) drug therapy: Secondary | ICD-10-CM | POA: Diagnosis not present

## 2018-09-01 DIAGNOSIS — N12 Tubulo-interstitial nephritis, not specified as acute or chronic: Secondary | ICD-10-CM | POA: Diagnosis not present

## 2018-09-01 DIAGNOSIS — R109 Unspecified abdominal pain: Secondary | ICD-10-CM | POA: Diagnosis not present

## 2018-09-01 DIAGNOSIS — R300 Dysuria: Secondary | ICD-10-CM | POA: Diagnosis present

## 2018-09-01 DIAGNOSIS — Z7984 Long term (current) use of oral hypoglycemic drugs: Secondary | ICD-10-CM | POA: Insufficient documentation

## 2018-09-01 DIAGNOSIS — N201 Calculus of ureter: Secondary | ICD-10-CM | POA: Diagnosis not present

## 2018-09-01 HISTORY — DX: Polycystic ovarian syndrome: E28.2

## 2018-09-01 HISTORY — DX: Interstitial cystitis (chronic) without hematuria: N30.10

## 2018-09-01 LAB — URINALYSIS, COMPLETE (UACMP) WITH MICROSCOPIC
Bilirubin Urine: NEGATIVE
Glucose, UA: NEGATIVE mg/dL
Ketones, ur: NEGATIVE mg/dL
Nitrite: POSITIVE — AB
Protein, ur: 100 mg/dL — AB
RBC / HPF: >50 RBC/hpf — ABNORMAL HIGH (ref 0–5)
Specific Gravity, Urine: 1.011 (ref 1.005–1.030)
WBC, UA: >50 WBC/hpf — ABNORMAL HIGH (ref 0–5)
pH: 6 (ref 5.0–8.0)

## 2018-09-01 LAB — CBC WITH DIFFERENTIAL/PLATELET
Abs Immature Granulocytes: 0.04 10*3/uL (ref 0.00–0.07)
Basophils Absolute: 0 10*3/uL (ref 0.0–0.1)
Basophils Relative: 0 %
Eosinophils Absolute: 0.1 10*3/uL (ref 0.0–0.5)
Eosinophils Relative: 1 %
HCT: 43.5 % (ref 36.0–46.0)
Hemoglobin: 14.7 g/dL (ref 12.0–15.0)
Immature Granulocytes: 0 %
Lymphocytes Relative: 23 %
Lymphs Abs: 2.6 10*3/uL (ref 0.7–4.0)
MCH: 31.5 pg (ref 26.0–34.0)
MCHC: 33.8 g/dL (ref 30.0–36.0)
MCV: 93.3 fL (ref 80.0–100.0)
Monocytes Absolute: 0.6 10*3/uL (ref 0.1–1.0)
Monocytes Relative: 5 %
Neutro Abs: 8 10*3/uL — ABNORMAL HIGH (ref 1.7–7.7)
Neutrophils Relative %: 71 %
Platelets: 352 10*3/uL (ref 150–400)
RBC: 4.66 MIL/uL (ref 3.87–5.11)
RDW: 11.9 % (ref 11.5–15.5)
WBC: 11.3 10*3/uL — ABNORMAL HIGH (ref 4.0–10.5)
nRBC: 0 % (ref 0.0–0.2)

## 2018-09-01 LAB — COMPREHENSIVE METABOLIC PANEL
ALT: 15 U/L (ref 0–44)
AST: 15 U/L (ref 15–41)
Albumin: 4.5 g/dL (ref 3.5–5.0)
Alkaline Phosphatase: 75 U/L (ref 38–126)
Anion gap: 8 (ref 5–15)
BUN: 10 mg/dL (ref 6–20)
CO2: 28 mmol/L (ref 22–32)
Calcium: 9.6 mg/dL (ref 8.9–10.3)
Chloride: 101 mmol/L (ref 98–111)
Creatinine, Ser: 0.76 mg/dL (ref 0.44–1.00)
GFR calc Af Amer: >60 mL/min (ref >60)
GFR calc non Af Amer: >60 mL/min (ref >60)
Glucose, Bld: 101 mg/dL — ABNORMAL HIGH (ref 70–99)
Potassium: 4 mmol/L (ref 3.5–5.1)
Sodium: 137 mmol/L (ref 135–145)
Total Bilirubin: 0.4 mg/dL (ref 0.3–1.2)
Total Protein: 7.5 g/dL (ref 6.5–8.1)

## 2018-09-01 LAB — POCT PREGNANCY, URINE: Preg Test, Ur: NEGATIVE

## 2018-09-01 MED ORDER — TAMSULOSIN HCL 0.4 MG PO CAPS
0.4000 mg | ORAL_CAPSULE | Freq: Once | ORAL | Status: AC
  Administered 2018-09-01: 0.4 mg via ORAL
  Filled 2018-09-01: qty 1

## 2018-09-01 MED ORDER — KETOROLAC TROMETHAMINE 30 MG/ML IJ SOLN
30.0000 mg | Freq: Once | INTRAMUSCULAR | Status: AC
  Administered 2018-09-01: 30 mg via INTRAVENOUS
  Filled 2018-09-01: qty 1

## 2018-09-01 MED ORDER — ONDANSETRON HCL 4 MG/2ML IJ SOLN
4.0000 mg | Freq: Once | INTRAMUSCULAR | Status: AC
  Administered 2018-09-01: 4 mg via INTRAVENOUS
  Filled 2018-09-01: qty 2

## 2018-09-01 MED ORDER — SODIUM CHLORIDE 0.9 % IV SOLN
1.0000 g | Freq: Once | INTRAVENOUS | Status: AC
  Administered 2018-09-01: 1 g via INTRAVENOUS
  Filled 2018-09-01: qty 10

## 2018-09-01 MED ORDER — OXYCODONE-ACETAMINOPHEN 5-325 MG PO TABS: 1.0000 | ORAL_TABLET | Freq: Three times a day (TID) | ORAL | 0 refills | Status: AC | PRN

## 2018-09-01 MED ORDER — TAMSULOSIN HCL 0.4 MG PO CAPS: 0.4000 mg | ORAL_CAPSULE | Freq: Every day | ORAL | 0 refills | Status: AC

## 2018-09-01 MED ORDER — CEPHALEXIN 500 MG PO CAPS: 500.0000 mg | ORAL_CAPSULE | Freq: Three times a day (TID) | ORAL | 0 refills | Status: AC

## 2018-09-01 MED ORDER — ONDANSETRON HCL 4 MG PO TABS: 4.0000 mg | ORAL_TABLET | Freq: Three times a day (TID) | ORAL | 0 refills | Status: AC | PRN

## 2018-09-01 NOTE — ED Triage Notes (Signed)
C/o burning during urination and lower back pain X3 days

## 2018-09-01 NOTE — ED Notes (Signed)
Pt given tv remote 

## 2018-09-01 NOTE — Discharge Instructions (Addendum)
Take Keflex 3 times daily for the next week and Flomax nightly at bedtime. I want you to return to the emergency department immediately with new or worsening symptoms.

## 2018-09-01 NOTE — ED Provider Notes (Signed)
[facility] Emergency Department Provider Note  ____________________________________________  Time seen: Approximately 10:32 PM  I have reviewed the triage vital signs and the nursing notes.   HISTORY  Chief Complaint Recurrent UTI    HPI [name] is a 33 y.o. female with a history of pyelonephritis, nephrolithiasis and interstitial cystitis, presents to the emergency department with dysuria and right-sided flank pain for the past 5 days.  Patient has had nausea but she denies fever, chills and vomiting.  Patient states that she has had pyelonephritis multiple times throughout her life and usually needs admission after she failed outpatient therapy.  Patient states that she delayed seeking care for her symptoms that she wanted to avoid COVID-19 infection.  She denies increased vaginal discharge or dyspareunia.  No concerns for STDs.  No other alleviating measures have been attempted.        Past Medical History:  Diagnosis Date  . ADHD (attention deficit hyperactivity disorder)   . Allergy   . Anxiety   . Bipolar 2 disorder (HCC)   . Blood transfusion as the cause of abnormal reaction of patient, or of later complication, without misadventure at the time of procedure    lost to much blood during delivery  . Depression   . Interstitial cystitis   . Kidney stones   . PCOS (polycystic ovarian syndrome)   . Sleep apnea     Patient Active Problem List   Diagnosis Date Noted  . Obesity (BMI 35.0-39.9 without comorbidity) 12/07/2017  . Medication monitoring encounter 03/05/2016  . Contact dermatitis 09/27/2015  . PCOS (polycystic ovarian syndrome) 01/07/2015  . Hirsutism 12/15/2014  . Cervical disc disorder with radiculopathy of cervical region 12/15/2014  . Elevated blood pressure 12/15/2014  . Sinusitis 10/18/2014  . SCIATICA 01/05/2008  . PLANTAR FASCIITIS, RIGHT 01/05/2008  . ANXIETY DISORDER 06/26/2007  . BACKACHE NOS 01/03/2007  .  BACK PAIN, LUMBAR 12/12/2006  . HERPES LABIALIS 12/02/2006  . NEPHROLITHIASIS 11/21/2006  . CYSTITIS, CHRONIC INTERSTITIAL 11/21/2006  . GRIEF REACTION 10/30/2006    Past Surgical History:  Procedure Laterality Date  . interstitial cystitis laparoscopy    . KIDNEY STONE SURGERY     basket, lithotripsy  . KNEE ARTHROSCOPY Right   . TONSILLECTOMY AND ADENOIDECTOMY    . TUBAL LIGATION      Prior to Admission medications   Medication Sig Start Date End Date Taking? Authorizing Provider  cephALEXin (KEFLEX) 500 MG capsule Take 1 capsule (500 mg total) by mouth 3 (three) times daily for 7 days. 09/01/18 09/08/18  Orvil Feil, PA-C  cyclobenzaprine (FLEXERIL) 10 MG tablet Take 1 tablet (10 mg total) by mouth 3 (three) times daily as needed for muscle spasms. 05/28/18   Particia Nearing, PA-C  metFORMIN (GLUCOPHAGE) 500 MG tablet Take by mouth 2 (two) times daily with a meal.    [provider]  omeprazole (PRILOSEC) 40 MG capsule Take 40 mg by mouth daily. 12/28/15   [provider]  ondansetron (ZOFRAN) 4 MG tablet Take 1 tablet (4 mg total) by mouth every 8 (eight) hours as needed for up to 3 days for nausea or vomiting. 09/01/18 09/04/18  Orvil Feil, PA-C  oxyCODONE-acetaminophen (PERCOCET/ROXICET) 5-325 MG tablet Take 1 tablet by mouth every 8 (eight) hours as needed for up to 3 days for severe pain. 09/01/18 09/04/18  Orvil Feil, PA-C  phentermine 37.5 MG capsule TAKE 1 CAPSULE (37.5 MG TOTAL) BY MOUTH EVERY MORNING BEFORE BREAKFAST 07/25/17  [provider]  predniSONE (DELTASONE) 10 MG tablet Take 6 tabs day one, 5 tabs day two, 4 tabs day three, etc 05/28/18   Particia Nearing, PA-C  spironolactone (ALDACTONE) 50 MG tablet TAKE 1 TABLET (50 MG TOTAL) BY MOUTH 3 (THREE) TIMES DAILY. INCREASE FROM 25 MG 01/31/17   Gabriel Cirri, NP  tamsulosin (FLOMAX) 0.4 MG CAPS capsule Take 1 capsule (0.4 mg total) by mouth daily for 10 days. 09/01/18 09/11/18   Orvil Feil, PA-C    Allergies Latex and Penicillins  Family History  Problem Relation Age of Onset  . Hyperlipidemia Mother   . Hypertension Mother   . Hypertension Father   . Hyperlipidemia Father   . Hyperlipidemia Sister   . Hyperlipidemia Maternal Grandfather   . Hypertension Maternal Grandfather   . Hypertension Paternal Grandmother   . Hyperlipidemia Paternal Grandmother   . Hyperlipidemia Paternal Grandfather   . Hypertension Paternal Grandfather     Social History Social History   Tobacco Use  . Smoking status: Never Smoker  . Smokeless tobacco: Never Used  Substance Use Topics  . Alcohol use: Yes    Comment: on occasion  . Drug use: No     Review of Systems  Constitutional: No fever/chills Eyes: No visual changes. No discharge ENT: No upper respiratory complaints. Cardiovascular: no chest pain. Respiratory: no cough. No SOB. Gastrointestinal: Patient has nausea.  No diarrhea.  No constipation. Genitourinary: Patient has dysuria and right-sided flank pain. Musculoskeletal: Negative for musculoskeletal pain. Skin: Negative for rash, abrasions, lacerations, ecchymosis. Neurological: Negative for headaches, focal weakness or numbness.   ____________________________________________   PHYSICAL EXAM:  VITAL SIGNS: ED Triage Vitals [09/01/18 1733]  Enc Vitals Group     BP (!) 144/83     Pulse Rate (!) 102     Resp 16     Temp 98.2 F (36.8 C)     Temp Source Oral     SpO2 99 %     Weight 199 lb (90.3 kg)     Height  (1.6 m)     Head Circumference      Peak Flow      Pain Score 9     Pain Loc      Pain Edu?      Excl. in GC?      Constitutional: Alert and oriented. Well appearing and in no acute distress. Eyes: Conjunctivae are normal. PERRL. EOMI. Head: Atraumatic. Cardiovascular: Normal rate, regular rhythm. Normal S1 and S2.  Good peripheral circulation. Respiratory: Normal respiratory effort without tachypnea or retractions.  Lungs CTAB. Good air entry to the bases with no decreased or absent breath sounds. Gastrointestinal: Bowel sounds 4 quadrants.  Patient has suprapubic tenderness to palpation.  No guarding or rigidity. No palpable masses. No distention.  Patient has right-sided CVA tenderness. Musculoskeletal: Full range of motion to all extremities. No gross deformities appreciated. Neurologic:  Normal speech and language. No gross focal neurologic deficits are appreciated.  Skin:  Skin is warm, dry and intact. No rash noted. Psychiatric: Mood and affect are normal. Speech and behavior are normal. Patient exhibits appropriate insight and judgement.   ____________________________________________   LABS (all labs ordered are listed, but only abnormal results are displayed)  Labs Reviewed  URINALYSIS, COMPLETE (UACMP) WITH MICROSCOPIC - Abnormal; Notable for the following components:      Result Value   Color, Urine AMBER (*)    APPearance HAZY (*)    Hgb urine dipstick LARGE (*)  Protein, ur 100 (*)    Nitrite POSITIVE (*)    Leukocytes,Ua LARGE (*)    RBC / HPF >50 (*)    WBC, UA >50 (*)    Bacteria, UA RARE (*)    All other components within normal limits  CBC WITH DIFFERENTIAL/PLATELET - Abnormal; Notable for the following components:   WBC 11.3 (*)    Neutro Abs 8.0 (*)    All other components within normal limits  COMPREHENSIVE METABOLIC PANEL - Abnormal; Notable for the following components:   Glucose, Bld 101 (*)    All other components within normal limits  POC URINE PREG, ED  POCT PREGNANCY, URINE   ____________________________________________  EKG   ____________________________________________  RADIOLOGY I personally viewed and evaluated these images as part of my medical decision making, as well as reviewing the written report by the radiologist.  Ct Renal Stone Study  Result Date: 09/01/2018 CLINICAL DATA:  Dysuria and flank pain EXAM: CT ABDOMEN AND PELVIS WITHOUT  CONTRAST TECHNIQUE: Multidetector CT imaging of the abdomen and pelvis was performed following the standard protocol without oral or IV contrast. COMPARISON:  September 07, 2013 FINDINGS: Lower chest: Lung bases are clear. Hepatobiliary: There are no appreciable focal liver lesions on this noncontrast enhanced study. The gallbladder wall is not appreciably thickened. There is no biliary duct dilatation. Pancreas: There is no pancreatic mass or inflammatory focus. Spleen: No splenic lesions are evident. Adrenals/Urinary Tract: Adrenals bilaterally appear normal. Right kidney is subtly edematous. There is no evident renal mass on either side. There is mild hydronephrosis on the right. There is no hydronephrosis on the left. There is a 2 mm calculus in the mid right kidney. There is a 1 mm calculus in the lower pole of the left kidney. There is a 2 mm calculus in the distal right ureter at the inferior acetabular level. No other ureteral calculi are appreciable. Urinary bladder is midline with wall thickness within normal limits. Stomach/Bowel: There is no appreciable bowel wall or mesenteric thickening. There is no evident bowel obstruction. Terminal ileum appears unremarkable. There is no evident free air or portal venous air. Vascular/Lymphatic: There is no abdominal aortic aneurysm. No evident vascular lesions on this noncontrast enhanced study. There is no evident adenopathy in the abdomen or pelvis. Reproductive: The uterus is anteverted. There is an apparent dominant follicle arising from the right ovary measuring 2.5 x 2.2 cm. No other pelvic mass evident. There is an apparent tubal ligation clip in the midline dependent portion of the pelvis. Only a single clip is demonstrable. Other: The appendix is normal in size and contour. There are apparent small appendicoliths within the appendix. There is no periappendiceal region inflammation. There is no evident abscess or ascites in the abdomen or pelvis. Musculoskeletal:  There are no blastic or lytic bone lesions. There is no intramuscular or abdominal wall lesion evident. IMPRESSION: 1. 2 mm calculus in the distal right ureter with mild hydronephrosis on the right. 2.  Small nonobstructing calculus in each kidney. 3. No evident bowel obstruction. No abscess in the abdomen or pelvis. No appendiceal inflammation evident. 4. Apparent tubal ligation clip in the midline of the posterior pelvis. This clip is likely displaced from the fallopian tube. Only a single clip is evident. Electronically Signed   By: Bretta Bang III M.D.   On: 09/01/2018 19:29    ____________________________________________    PROCEDURES  Procedure(s) performed:    Procedures    Medications  cefTRIAXone (ROCEPHIN) 1 g in  sodium chloride 0.9 % 100 mL IVPB (0 g Intravenous Stopped 09/01/18 2107)  ondansetron (ZOFRAN) injection 4 mg (4 mg Intravenous Given 09/01/18 2031)  ketorolac (TORADOL) 30 MG/ML injection 30 mg (30 mg Intravenous Given 09/01/18 2031)  tamsulosin (FLOMAX) capsule 0.4 mg (0.4 mg Oral Given 09/01/18 2044)     ____________________________________________   INITIAL IMPRESSION / ASSESSMENT AND PLAN / ED COURSE  Pertinent labs & imaging results that were available during my care of the patient were reviewed by me and considered in my medical decision making (see chart for details).  Review of the [place] CSRS was performed in accordance of the [facility] prior to dispensing any controlled drugs.         Assessment and plan Pyelonephritis Ureterolithiasis  33 year old female presents to the emergency department with 5 days of dysuria and right-sided flank pain with history of pyelonephritis, nephrolithiasis and interstitial cystitis.  On physical exam, patient was initially mildly tachycardic.  She was afebrile and declined any type of pain medication in the emergency department.  She had some suprapubic tenderness to palpation on physical exam and right-sided CVA  tenderness.  Basic labs were conducted in the emergency department and patient had mild leukocytosis on CBC.  CT renal stone study indicated a 2 mm stone in the right ureter.  Attending physician Dr. Lenard LancePaduchowski was consulted regarding patient's case. Urologist on call, Dr. Mena GoesEskridge was consulted.  Dr. Mena GoesEskridge recommended IV Rocephin in the emergency department and Keflex and Flomax for outpatient therapy.  Patient was given IV Rocephin in the emergency department as well as Flomax.  She was discharged with a short course of Percocet and Zofran.  She was given strict return precautions to return to the emergency department immediately if her symptoms worsen.  She voiced understanding and stated that she had easy access to the emergency department if she does not improve at home.  All patient questions were answered.   ____________________________________________  FINAL CLINICAL IMPRESSION(S) / ED DIAGNOSES  Final diagnoses:  Pyelonephritis  Ureterolithiasis      NEW MEDICATIONS STARTED DURING THIS VISIT:  ED Discharge Orders         Ordered    cephALEXin (KEFLEX) 500 MG capsule  3 times daily     09/01/18 2100    tamsulosin (FLOMAX) 0.4 MG CAPS capsule  Daily     09/01/18 2100    oxyCODONE-acetaminophen (PERCOCET/ROXICET) 5-325 MG tablet  Every 8 hours PRN     09/01/18 2100    ondansetron (ZOFRAN) 4 MG tablet  Every 8 hours PRN     09/01/18 2100              This chart was dictated using voice recognition software/Dragon. Despite best efforts to proofread, errors can occur which can change the meaning. Any change was purely unintentional.    Orvil FeilWoods, Onofre Gains M, PA-C 09/01/18 2239    Minna AntisPaduchowski, Kevin, MD 09/01/18 2256

## 2018-09-10 NOTE — Progress Notes (Signed)
09/11/2018 1:01 PM   [name] 1985-09-20 [id number]  Referring provider: Marjie Skiff, NP [address] [place], [place] [address]  Chief Complaint  Patient presents with  . Nephrolithiasis    HPI: Patient is a 33 year old Caucasian female who was referred by [name] for nephrolithiasis.    She presented to the ED on 09/01/2018 with the complaint of dysuria and right sided flank pain associated with nausea for 5 days.  CT Renal stone study noted the adrenals bilaterally appear normal. Right kidney is subtly edematous. There is no evident renal mass on either side. There is mild hydronephrosis on the right. There is no hydronephrosis on the left. There is a 2 mm calculus in the mid right kidney. There is a 1 mm calculus in the lower pole of the left kidney. There is a 2 mm calculus in the distal right ureter at the inferior acetabular level. No other ureteral calculi are appreciable. Urinary bladder is midline with wall thickness within normal limits.  Labs in the ED:  Serum creatinine 0.76, WBC count 11.3, negative pregnancy test, UA nitrite +, > 50 RBC's and WBC's   Meds given in the ED: Rocephin, Keflex, Zofran, Toradol, Percocet and Flomax   Prior urological history:  hx of pyelonephritis, nephrolithiasis, IC    Current NSAID/anticoagulation:   none   Today, she is complaining of frequency, urgency, dysuria, nocturia and gross hematuria.  She is having 5-7/10 pain in her right back and right groin associated with bladder pressure.  She does not have a strainer.  She finished the Keflex two days ago.  She states that the right flank pain abated while she was on the antibiotic, but it has returned.  Patient denies any fevers, chills, nausea or vomiting.   She does not feel that she has passed a stone at this time.  Her UA today is positive for 11-30 RBC's and moderate bacteria.     PMH: Past Medical History:  Diagnosis Date  . ADHD (attention deficit hyperactivity  disorder)   . Allergy   . Anxiety   . Bipolar 2 disorder (HCC)   . Blood transfusion as the cause of abnormal reaction of patient, or of later complication, without misadventure at the time of procedure    lost to much blood during delivery  . Depression   . Interstitial cystitis   . Kidney stones   . PCOS (polycystic ovarian syndrome)   . Sleep apnea     Surgical History: Past Surgical History:  Procedure Laterality Date  . interstitial cystitis laparoscopy    . KIDNEY STONE SURGERY     basket, lithotripsy  . KNEE ARTHROSCOPY Right   . TONSILLECTOMY AND ADENOIDECTOMY    . TUBAL LIGATION      Home Medications:  Allergies as of 09/11/2018      Reactions   Latex Anaphylaxis   Penicillins Nausea And Vomiting      Medication List       Accurate as of September 11, 2018  1:01 PM. If you have any questions, ask your nurse or doctor.        cefUROXime 500 MG tablet Commonly known as:  CEFTIN Take 1 tablet (500 mg total) by mouth 2 (two) times daily with a meal. Started by:  Michiel Cowboy, PA-C   cyclobenzaprine 10 MG tablet Commonly known as:  FLEXERIL Take 1 tablet (10 mg total) by mouth 3 (three) times daily as needed for muscle spasms.  metFORMIN 500 MG 24 hr tablet Commonly known as:  GLUCOPHAGE-XR Take 500 mg by mouth 2 (two) times daily.   omeprazole 40 MG capsule Commonly known as:  PRILOSEC Take 40 mg by mouth daily.   phentermine 37.5 MG tablet Commonly known as:  ADIPEX-P Take 37.5 mg by mouth daily before breakfast.   spironolactone 50 MG tablet Commonly known as:  ALDACTONE TAKE 1 TABLET (50 MG TOTAL) BY MOUTH 3 (THREE) TIMES DAILY. INCREASE FROM 25 MG   tamsulosin 0.4 MG Caps capsule Commonly known as:  Flomax Take 1 capsule (0.4 mg total) by mouth daily for 10 days.   Ustell 120 MG Caps Take 1 capsule (120 mg total) by mouth 2 (two) times a day. Started by:  Michiel Cowboy, PA-C   valACYclovir 1000 MG tablet Commonly known as:  VALTREX TAKE  1 TABLET (1,000 MG TOTAL) BY MOUTH ONCE DAILY       Allergies:  Allergies  Allergen Reactions  . Latex Anaphylaxis  . Penicillins Nausea And Vomiting    Family History: Family History  Problem Relation Age of Onset  . Hyperlipidemia Mother   . Hypertension Mother   . Hypertension Father   . Hyperlipidemia Father   . Hyperlipidemia Sister   . Hyperlipidemia Maternal Grandfather   . Hypertension Maternal Grandfather   . Hypertension Paternal Grandmother   . Hyperlipidemia Paternal Grandmother   . Hyperlipidemia Paternal Grandfather   . Hypertension Paternal Grandfather     Social History:  reports that she has never smoked. She has never used smokeless tobacco. She reports current alcohol use. She reports that she does not use drugs.  ROS: UROLOGY Frequent Urination?: Yes Hard to postpone urination?: Yes Burning/pain with urination?: Yes Get up at night to urinate?: Yes Leakage of urine?: No Urine stream starts and stops?: No Trouble starting stream?: No Do you have to strain to urinate?: No Blood in urine?: Yes Urinary tract infection?: Yes Sexually transmitted disease?: No Injury to kidneys or bladder?: No Painful intercourse?: No Weak stream?: No Currently pregnant?: No Vaginal bleeding?: No Last menstrual period?: 09/11/2018  Gastrointestinal Nausea?: No Vomiting?: No Indigestion/heartburn?: No Diarrhea?: No Constipation?: No  Constitutional Fever: No Night sweats?: No Weight loss?: No Fatigue?: No  Skin Skin rash/lesions?: No Itching?: No  Eyes Blurred vision?: No Double vision?: No  Ears/Nose/Throat Sore throat?: No Sinus problems?: No  Hematologic/Lymphatic Swollen glands?: No Easy bruising?: No  Cardiovascular Leg swelling?: No Chest pain?: No  Respiratory Cough?: No Shortness of breath?: No  Endocrine Excessive thirst?: No  Musculoskeletal Back pain?: No Joint pain?: No  Neurological Headaches?: No Dizziness?: No   Psychologic Depression?: No Anxiety?: No  Physical Exam: BP 134/82 (BP Location: Left Arm, Patient Position: Sitting, Cuff Size: Normal)   Pulse 82   Ht 5\' 2"  (1.575 m)   Wt 207 lb 8 oz (94.1 kg)   LMP 09/11/2018 Comment: neg preg test  BMI 37.95 kg/m   Constitutional:  Well nourished. Alert and oriented, No acute distress. HEENT: [place] AT, moist mucus membranes.  Trachea midline, no masses. Cardiovascular: No clubbing, cyanosis, or edema. Respiratory: Normal respiratory effort, no increased work of breathing. Skin: No rashes, bruises or suspicious lesions. Neurologic: Grossly intact, no focal deficits, moving all 4 extremities. Psychiatric: Normal mood and affect.   Laboratory Data: Lab Results  Component Value Date   WBC 11.3 (H) 09/01/2018   HGB 14.7 09/01/2018   HCT 43.5 09/01/2018   MCV 93.3 09/01/2018   PLT 352 09/01/2018  Lab Results  Component Value Date   CREATININE 0.76 09/01/2018    No results found for: PSA  No results found for: TESTOSTERONE  No results found for: HGBA1C  Lab Results  Component Value Date   TSH 1.998 05/07/2008       Component Value Date/Time   CHOL 167 05/07/2008 2058   HDL 58 05/07/2008 2058   CHOLHDL 2.9 Ratio 05/07/2008 2058   VLDL 21 05/07/2008 2058   LDLCALC 88 05/07/2008 2058    Lab Results  Component Value Date   AST 15 09/01/2018   Lab Results  Component Value Date   ALT 15 09/01/2018   No components found for: ALKALINEPHOPHATASE No components found for: BILIRUBINTOTAL  No results found for: ESTRADIOL  Urinalysis See hpi and epic. I have reviewed the labs.   Pertinent Imaging: CLINICAL DATA:  Dysuria and flank pain  EXAM: CT ABDOMEN AND PELVIS WITHOUT CONTRAST  TECHNIQUE: Multidetector CT imaging of the abdomen and pelvis was performed following the standard protocol without oral or IV contrast.  COMPARISON:  September 07, 2013  FINDINGS: Lower chest: Lung bases are clear.  Hepatobiliary:  There are no appreciable focal liver lesions on this noncontrast enhanced study. The gallbladder wall is not appreciably thickened. There is no biliary duct dilatation.  Pancreas: There is no pancreatic mass or inflammatory focus.  Spleen: No splenic lesions are evident.  Adrenals/Urinary Tract: Adrenals bilaterally appear normal. Right kidney is subtly edematous. There is no evident renal mass on either side. There is mild hydronephrosis on the right. There is no hydronephrosis on the left. There is a 2 mm calculus in the mid right kidney. There is a 1 mm calculus in the lower pole of the left kidney. There is a 2 mm calculus in the distal right ureter at the inferior acetabular level. No other ureteral calculi are appreciable. Urinary bladder is midline with wall thickness within normal limits.  Stomach/Bowel: There is no appreciable bowel wall or mesenteric thickening. There is no evident bowel obstruction. Terminal ileum appears unremarkable. There is no evident free air or portal venous air.  Vascular/Lymphatic: There is no abdominal aortic aneurysm. No evident vascular lesions on this noncontrast enhanced study. There is no evident adenopathy in the abdomen or pelvis.  Reproductive: The uterus is anteverted. There is an apparent dominant follicle arising from the right ovary measuring 2.5 x 2.2 cm. No other pelvic mass evident. There is an apparent tubal ligation clip in the midline dependent portion of the pelvis. Only a single clip is demonstrable.  Other: The appendix is normal in size and contour. There are apparent small appendicoliths within the appendix. There is no periappendiceal region inflammation. There is no evident abscess or ascites in the abdomen or pelvis.  Musculoskeletal: There are no blastic or lytic bone lesions. There is no intramuscular or abdominal wall lesion evident.  IMPRESSION: 1. 2 mm calculus in the distal right ureter with mild  hydronephrosis on the right.  2.  Small nonobstructing calculus in each kidney.  3. No evident bowel obstruction. No abscess in the abdomen or pelvis. No appendiceal inflammation evident.  4. Apparent tubal ligation clip in the midline of the posterior pelvis. This clip is likely displaced from the fallopian tube. Only a single clip is evident.   Electronically Signed   By: Bretta BangWilliam  Woodruff III M.D.   On: 09/01/2018 19:29  I have independently reviewed the films.    Assessment & Plan:    1. Right ureteral stone  UA is + for hematuria and patient is still symptomatic, so it is likely the stone has not passed She will continue the tamsulosin 0.4 mg daily and is given a strainer Refill given for pain meds once pregnancy test  2. Right hydronephrosis Will obtain RUS to ensure the hydronephrosis has resolved once they have passed and/or recovered from procedure to ensure to iatrogenic hydronephrosis remains - it is explained to the patient that it is important to document resolution of the hydronephrosis as "silent hydronephrosis" can occur and cause damage and/or loss of the kidney  3. Gross hematuria  - UA today demonstrates 11-30 RBC's  - continue to monitor the patient's UA after the treatment/passage of the stone to ensure the hematuria has resolved  - if hematuria persists, we will pursue a hematuria workup with CT Urogram and cystoscopy if appropriate.  4. Pyelonephritis  UA + for RBC's and moderate bacteria, sent for culture Given Ceftin - will adjust once culture is available Pregnancy test today  Patient is advised that if they should start to experience pain that is not able to be controlled with pain medication, intractable nausea and/or vomiting and/or fevers greater than 103 or shaking chills to contact the office immediately or seek treatment in the emergency department for emergent intervention.    5. IC Exacerbated by stone event Meth-Hyo-M Bl-Na Phos-Ph  Sal sent to the pharmacy to take twice daily     Return for pending urine culture and pregnancy test .  These notes generated with voice recognition software. I apologize for typographical errors.  Michiel Cowboy, PA-C  [facility] [address]  [address] [place], [place] [address] [phone]

## 2018-09-11 ENCOUNTER — Other Ambulatory Visit: Payer: Self-pay

## 2018-09-11 ENCOUNTER — Encounter: Payer: Self-pay | Admitting: Urology

## 2018-09-11 ENCOUNTER — Ambulatory Visit (INDEPENDENT_AMBULATORY_CARE_PROVIDER_SITE_OTHER): Payer: Commercial Managed Care - PPO | Admitting: Urology

## 2018-09-11 VITALS — BP 134/82 | HR 82 | Ht 62.0 in | Wt 207.5 lb

## 2018-09-11 DIAGNOSIS — N12 Tubulo-interstitial nephritis, not specified as acute or chronic: Secondary | ICD-10-CM

## 2018-09-11 DIAGNOSIS — R310 Gross hematuria: Secondary | ICD-10-CM | POA: Diagnosis not present

## 2018-09-11 DIAGNOSIS — N201 Calculus of ureter: Secondary | ICD-10-CM

## 2018-09-11 DIAGNOSIS — N132 Hydronephrosis with renal and ureteral calculous obstruction: Secondary | ICD-10-CM

## 2018-09-11 LAB — URINALYSIS, COMPLETE
Bilirubin, UA: NEGATIVE
Glucose, UA: NEGATIVE
Ketones, UA: NEGATIVE
Leukocytes,UA: NEGATIVE
Nitrite, UA: NEGATIVE
Specific Gravity, UA: 1.025 (ref 1.005–1.030)
Urobilinogen, Ur: 0.2 mg/dL (ref 0.2–1.0)
pH, UA: 7 (ref 5.0–7.5)

## 2018-09-11 LAB — MICROSCOPIC EXAMINATION: WBC, UA: NONE SEEN /hpf (ref 0–5)

## 2018-09-11 MED ORDER — CEFUROXIME AXETIL 500 MG PO TABS: 500.0000 mg | ORAL_TABLET | Freq: Two times a day (BID) | ORAL | 0 refills | Status: DC

## 2018-09-11 MED ORDER — USTELL 120 MG PO CAPS: 1.0000 | ORAL_CAPSULE | Freq: Two times a day (BID) | ORAL | 1 refills | Status: DC

## 2018-09-11 NOTE — Patient Instructions (Addendum)
Kidney Stones  Kidney stones (urolithiasis) are solid, rock-like deposits that form inside of the organs that make urine (kidneys). A kidney stone may form in a kidney and move into the bladder, where it can cause intense pain and block the flow of urine. Kidney stones are created when high levels of certain minerals are found in the urine. They are usually passed through urination, but in some cases, medical treatment may be needed to remove them. What are the causes? Kidney stones may be caused by:  A condition in which certain glands produce too much parathyroid hormone (primary hyperparathyroidism), which causes too much calcium buildup in the blood.  Buildup of uric acid crystals in the bladder (hyperuricosuria). Uric acid is a chemical that the body produces when you eat certain foods. It usually exits the body in the urine.  Narrowing (stricture) of one or both of the tubes that drain urine from the kidneys to the bladder (ureters).  A kidney blockage that is present at birth (congenital obstruction).  Past surgery on the kidney or the ureters, such as gastric bypass surgery. What increases the risk? The following factors make you more likely to develop kidney stones:  Having had a kidney stone in the past.  Having a family history of kidney stones.  Not drinking enough water.  Eating a diet that is high in protein, salt (sodium), or sugar.  Being overweight or obese. What are the signs or symptoms? Symptoms of a kidney stone may include:  Nausea.  Vomiting.  Blood in the urine (hematuria).  Pain in the side of the abdomen, right below the ribs (flank pain). Pain usually spreads (radiates) to the groin.  Needing to urinate frequently or urgently. How is this diagnosed? This condition may be diagnosed based on:  Your medical history.  A physical exam.  Blood tests.  Urine tests.  CT scan.  Abdominal X-ray.  A procedure to examine the inside of the bladder  (cystoscopy). How is this treated? Treatment for kidney stones depends on the size, location, and makeup of the stones. Treatment may involve:  Analyzing your urine before and after you pass the stone through urination.  Being monitored at the hospital until you pass the stone through urination.  Increasing your fluid intake and decreasing the amount of calcium and protein in your diet.  A procedure to break up kidney stones in the bladder using: ? A focused beam of light (laser therapy). ? Shock waves (extracorporeal shock wave lithotripsy).  Surgery to remove kidney stones. This may be needed if you have severe pain or have stones that block your urinary tract. Follow these instructions at home: Eating and drinking  Drink enough fluid to keep your urine clear or pale yellow. This will help you to pass the kidney stone.  If directed, change your diet. This may include: ? Limiting how much sodium you eat. ? Eating more fruits and vegetables. ? Limiting how much meat, poultry, fish, and eggs you eat.  Follow instructions from your health care provider about eating or drinking restrictions. General instructions  Collect urine samples as told by your health care provider. You may need to collect a urine sample: ? 24 hours after you pass the stone. ? 8-12 weeks after passing the kidney stone, and every 6-12 months after that.  Strain your urine every time you urinate, for as long as directed. Use the strainer that your health care provider recommends.  Do not throw out the kidney stone after passing   it. Keep the stone so it can be tested by your health care provider. Testing the makeup of your kidney stone may help prevent you from getting kidney stones in the future.  Take over-the-counter and prescription medicines only as told by your health care provider.  Keep all follow-up visits as told by your health care provider. This is important. You may need follow-up X-rays or  ultrasounds to make sure that your stone has passed. How is this prevented? To prevent another kidney stone:  Drink enough fluid to keep your urine clear or pale yellow. This is the best way to prevent kidney stones.  Eat a healthy diet and follow recommendations from your health care provider about foods to avoid. You may be instructed to eat a low-protein diet. Recommendations vary depending on the type of kidney stone that you have.  Maintain a healthy weight. Contact a health care provider if:  You have pain that gets worse or does not get better with medicine. Get help right away if:  You have a fever or chills.  You develop severe pain.  You develop new abdominal pain.  You faint.  You are unable to urinate. This information is not intended to replace advice given to you by your health care provider. Make sure you discuss any questions you have with your health care provider. Document Released: 03/26/2005 Document Revised: 09/06/2016 Document Reviewed: 09/09/2015 Elsevier Interactive Patient Education  2019 [employer].  Eating Plan for Interstitial Cystitis Interstitial cystitis (IC) is a long-term (chronic) condition that causes pain and pressure in the bladder, the lower abdomen, and the pelvic area. Other symptoms of IC include urinary urgency and frequency. Symptoms tend to come and go. Many people with IC find that certain foods trigger their symptoms. Different foods may be problematic for different people. Some foods are more likely to cause symptoms than others. Learning which foods bother you and which do not can help you come up with an eating plan to manage IC. What are tips for following this plan? You may find it helpful to work with a dietitian. This health care provider can help you develop your eating plan by doing an elimination diet, which involves these steps:  Start with a list of foods that you think trigger your IC symptoms along with the foods that  most commonly trigger symptoms for many people with IC.  Eliminate those foods from your diet for about one month, then start reintroducing the foods one at a time to see which ones trigger your symptoms.  Make a list of the foods that trigger your symptoms. It may take several months to find out which foods bother you. Reading food labels Once you know which foods trigger your IC symptoms, you can avoid them. However, it is also a good idea to read food labels because some foods that trigger your symptoms may be included as ingredients in other foods. These ingredients may include:  Chili peppers.  Tomato products.  Soy.  Worcestershire sauce.  Vinegar.  Alcohol.  Citrus flavors or juices.  Artificial sweeteners.  Monosodium glutamate. Shopping  Shopping can be a challenge if many foods trigger your IC. When you go grocery shopping, bring a list of the foods you can eat.  You can get an app for your phone that lets you know which foods are the safest and which you may want to avoid. You can find the app at the Interstitial Cystitis Network website: www.ic-network.com Meal planning  Plan your meals  according to the results of your elimination diet. If you have not done an elimination diet, plan meals according to IC food lists recommended by your health care provider or dietitian. These lists tell you which foods are least and most likely to cause symptoms.  Avoid certain types of food when you go out to eat, such as pizza and foods typically served at [place], [place], and [place]. These foods often contain ingredients that can aggravate IC. General information Here are some general guidelines for an IC eating plan:  Do not eat large portions.  Drink plenty of fluids with your meals.  Do not eat foods that are high in sugar, salt, or saturated fat.  Choose whole fruits instead of juice.  Eat a colorful variety of vegetables. What foods should I eat? For people  with IC, the best diet is a balanced one that includes things from all the food groups. Even if you have to avoid certain foods, there are still plenty of healthy choices in each group. The following are some foods that are least bothersome and may be safest to eat: Fruits Bananas. Blueberries and blueberry juice. Melons. Pears. Apples. Dates. Prunes. Raisins. Apricots. Vegetables Asparagus. Avocado. Celery. Beets. Bell peppers. Black olives. Broccoli. Brussels sprouts. Cabbage. Carrots. Cauliflower. Cucumber. Eggplant. Green beans. Potatoes. Radishes. Spinach. Squash. Turnips. Zucchini. Mushrooms. Peas. Grains Oats. Rice. Bran. Oatmeal. Whole wheat bread. Meats and other proteins Beef. Fish and other seafood. Eggs. Nuts. Peanut butter. Pork. Poultry. Lamb. Garbanzo beans. Pinto beans. Dairy Whole or low-fat milk. American, mozzarella, mild cheddar, feta, ricotta, and cream cheeses. The items listed above may not be a complete list of foods and beverages you can eat. Contact a dietitian for more information. What foods should I avoid? You should avoid any foods that seem to trigger your symptoms. It is also a good idea to avoid foods that are most likely to cause symptoms in many people with IC. These include the following: Fruits Citrus fruits, including lemons, limes, oranges, and grapefruit. Cranberries. Strawberries. Pineapple. Kiwi. Vegetables Chili peppers. Onions. Sauerkraut. Tomato and tomato products. Rosita Fire. Grains You do not need to avoid any type of grain unless it triggers your symptoms. Meats and other proteins Precooked or cured meats, such as sausages or meat loaves. Soy products. Dairy Chocolate ice cream. Processed cheese. Yogurt. Beverages Alcohol. Chocolate drinks. Coffee. Cranberry juice. Carbonated drinks. Tea (black, green, or herbal). Tomato juice. Sports drinks. The items listed above may not be a complete list of foods and beverages you should avoid. Contact a  dietitian for more information. Summary  Many people with IC find that certain foods trigger their symptoms. Different foods may be problematic for different people. Some foods are more likely to cause symptoms than others.  You may find it helpful to work with a dietitian to do an elimination diet and come up with an eating plan that is right for you.  Plan your meals according to the results of your elimination diet. If you have not done an elimination diet, plan your meals using IC food lists. These lists tell you which foods are least and most likely to cause symptoms.  The best diet for people with IC is a balanced diet that includes foods from all the food groups. Even if you have to avoid certain foods, there are still plenty of healthy choices in each group. This information is not intended to replace advice given to you by your health care provider. Make sure you discuss any  questions you have with your health care provider. Document Released: 11/28/2017 Document Revised: 11/28/2017 Document Reviewed: 11/28/2017 Elsevier Interactive Patient Education  2019 [employer]

## 2018-09-12 ENCOUNTER — Other Ambulatory Visit: Payer: Self-pay | Admitting: Urology

## 2018-09-12 LAB — HCG, SERUM, QUALITATIVE: hCG,Beta Subunit,Qual,Serum: NEGATIVE m[IU]/mL (ref <6)

## 2018-09-12 MED ORDER — OXYCODONE-ACETAMINOPHEN 5-325 MG PO TABS: 1.0000 | ORAL_TABLET | Freq: Four times a day (QID) | ORAL | 0 refills | Status: DC | PRN

## 2018-09-12 MED ORDER — FLUCONAZOLE 150 MG PO TABS: 150.0000 mg | ORAL_TABLET | Freq: Once | ORAL | 0 refills | Status: AC

## 2018-09-14 LAB — CULTURE, URINE COMPREHENSIVE

## 2018-09-19 ENCOUNTER — Telehealth: Payer: Self-pay | Admitting: Urology

## 2018-09-19 NOTE — Telephone Encounter (Signed)
-----   Message from Nori Riis, PA-C sent at 09/15/2018  7:41 AM EDT ----- Would you call Mrs. Mcclafferty and schedule her for an appointment for a KUB and office visit?

## 2018-09-19 NOTE — Telephone Encounter (Signed)
Lm for pt to cb to schedule app She just needs to be scheduled

## 2018-09-24 ENCOUNTER — Other Ambulatory Visit: Payer: Self-pay

## 2018-09-24 DIAGNOSIS — N200 Calculus of kidney: Secondary | ICD-10-CM

## 2018-09-24 DIAGNOSIS — N3010 Interstitial cystitis (chronic) without hematuria: Secondary | ICD-10-CM

## 2018-09-25 ENCOUNTER — Other Ambulatory Visit: Payer: Commercial Managed Care - PPO

## 2018-09-25 ENCOUNTER — Other Ambulatory Visit: Payer: Self-pay | Admitting: Urology

## 2018-09-25 ENCOUNTER — Ambulatory Visit
Admission: RE | Admit: 2018-09-25 | Discharge: 2018-09-25 | Disposition: A | Discharge status: Home / Self Care | Payer: Commercial Managed Care - PPO | Source: Ambulatory Visit | Attending: Urology | Admitting: Urology

## 2018-09-25 DIAGNOSIS — N201 Calculus of ureter: Secondary | ICD-10-CM

## 2018-09-27 LAB — CULTURE, URINE COMPREHENSIVE

## 2018-09-30 ENCOUNTER — Other Ambulatory Visit: Payer: Self-pay

## 2018-09-30 ENCOUNTER — Ambulatory Visit: Payer: Commercial Managed Care - PPO | Admitting: Urology

## 2018-09-30 ENCOUNTER — Telehealth: Payer: Self-pay | Admitting: Urology

## 2018-09-30 ENCOUNTER — Telehealth (INDEPENDENT_AMBULATORY_CARE_PROVIDER_SITE_OTHER): Payer: Commercial Managed Care - PPO | Admitting: Urology

## 2018-09-30 DIAGNOSIS — N201 Calculus of ureter: Secondary | ICD-10-CM

## 2018-09-30 MED ORDER — OXYCODONE-ACETAMINOPHEN 10-325 MG PO TABS: 1.0000 | ORAL_TABLET | Freq: Four times a day (QID) | ORAL | 0 refills | Status: DC | PRN

## 2018-09-30 MED ORDER — AMOXICILLIN-POT CLAVULANATE 875-125 MG PO TABS: 1.0000 | ORAL_TABLET | Freq: Two times a day (BID) | ORAL | 0 refills | Status: DC

## 2018-09-30 MED ORDER — FLUCONAZOLE 150 MG PO TABS: 150.0000 mg | ORAL_TABLET | Freq: Once | ORAL | 0 refills | Status: DC

## 2018-09-30 NOTE — Telephone Encounter (Signed)
Would you schedule [name] for a CT Renal stone report appointment?

## 2018-09-30 NOTE — Telephone Encounter (Signed)
App made pt notified °

## 2018-09-30 NOTE — Progress Notes (Signed)
Virtual Visit via Video Note  I connected with [name] on 09/30/18 at  2:00 PM EDT by a video enabled telemedicine application and verified that I am speaking with the correct person using two identifiers.  Location: Patient: Home Provider: Home   I discussed the limitations of evaluation and management by telemedicine and the availability of in person appointments. The patient expressed understanding and agreed to proceed.  History of Present Illness: [name] is a 33 year old female with an obstructing right distal ureter who is contacted via [email or web address] for a follow up visit.    Follow up KUB on 09/25/2018 noted several pelvic phleboliths making it difficult to assess for the present of a right distal ureteral stone.  Her urine culture was also positive for a Beta hemolytic Streptococcus, group B.    She is still having occasional episodes of severe right flank pain.  She can go one to two days without feeling the pain and then it returns.  It mostly bothers her in the evening and is interrupting her sleep.  She has not passed a fragment and has been straining her urine.  Patient denies any gross hematuria, dysuria or suprapubic pain.  Patient denies any fevers, chills, nausea or vomiting.  She states the current prescription of oxycodone/APAP, 5/325 is not touching the pain at night.      Observations/Objective: CLINICAL DATA:  Right flank pain nephrolithiasis  EXAM: ABDOMEN - 1 VIEW  COMPARISON:  09/01/2018  FINDINGS: Renal shadows are partially obscured by stool and gas pattern. No large radiopaque upper urinary tract calculi appreciated. Nonobstructive bowel gas pattern. No acute osseous finding. Tubal ligation clips in the pelvis in the midline. Punctate tiny bilateral pelvic calcifications noted, suspect vascular. Difficult to exclude subcentimeter distal ureteral calculi. Previously described 2 mm right distal ureteral calculus by CT 09/01/2018 would be  difficult to reassess and compare by plain radiography.  IMPRESSION: No upper urinary tract calculi by plain radiography.  Nonspecific likely vascular pelvic calcifications.  See above comment.  No other acute finding by plain radiography   Electronically Signed   By: Jerilynn Mages.  Shick M.D.   On: 09/26/2018 09:16 I have independently reviewed the films  Results for orders placed or performed in visit on 09/24/18  CULTURE, URINE COMPREHENSIVE   Specimen: Urine   UR  Result Value Ref Range   Urine Culture, Comprehensive Final report (A)    Organism ID, Bacteria Comment (A)    Organism ID, Bacteria Comment     Assessment and Plan:  1. Right distal ureteral stone Patient is still symptomatic Offered RUS or CT Renal stone studies and discussed the pros and cons of each She would like to move forward with the CT Renal stone study at this time as she wants the most definitive test performed CT Renal stone CT ordered She will follow up for report Patient is advised that if they should start to experience pain that is not able to be controlled with pain medication, intractable nausea and/or vomiting and/or fevers greater than 103 or shaking chills to contact the office immediately or seek treatment in the emergency department for emergent intervention.    2. UTI Augmentin sent to pharmacy along with prescription for diflucan   Follow Up Instructions:  [name] will return for a follow up appointment to review her CT Renal Stone report.     I discussed the assessment and treatment plan with the patient. The patient was provided an opportunity to ask  questions and all were answered. The patient agreed with the plan and demonstrated an understanding of the instructions.   The patient was advised to call back or seek an in-person evaluation if the symptoms worsen or if the condition fails to improve as anticipated.  I provided 5 minutes of non-face-to-face time during this  encounter.   Breylen Agyeman, PA-C

## 2018-10-08 ENCOUNTER — Ambulatory Visit
Admission: RE | Admit: 2018-10-08 | Discharge: 2018-10-08 | Disposition: A | Discharge status: Home / Self Care | Payer: Commercial Managed Care - PPO | Source: Ambulatory Visit | Attending: Urology | Admitting: Urology

## 2018-10-08 ENCOUNTER — Other Ambulatory Visit: Payer: Self-pay

## 2018-10-08 DIAGNOSIS — N201 Calculus of ureter: Secondary | ICD-10-CM | POA: Insufficient documentation

## 2018-10-08 NOTE — Progress Notes (Signed)
10/09/2018 1:42 PM   [name] 1985/08/25 [id number]  Referring provider: Venita Lick, NP [address] [place],  [place] [address]  Chief Complaint  Patient presents with  . Nephrolithiasis    HPI: Patient is a 33 year old Caucasian female who presents for CT Renal stone study and follow up UA.    Background history Referred by [facility] [place] ED for nephrolithiasis.   She presented to the ED on 09/01/2018 with the complaint of dysuria and right sided flank pain associated with nausea for 5 days.  CT Renal stone study noted the adrenals bilaterally appear normal. Right kidney is subtly edematous. There is no evident renal mass on either side. There is mild hydronephrosis on the right. There is no hydronephrosis on the left. There is a 2 mm calculus in the mid right kidney. There is a 1 mm calculus in the lower pole of the left kidney. There is a 2 mm calculus in the distal right ureter at the inferior acetabular level. No other ureteral calculi are appreciable. Urinary bladder is midline with wall thickness within normal limits. Labs in the ED:  Serum creatinine 0.76, WBC count 11.3, negative pregnancy test, UA nitrite +, > 50 RBC's and WBC's  Meds given in the ED: Rocephin, Keflex, Zofran, Toradol, Percocet and Flomax  Prior urological history:  hx of pyelonephritis, nephrolithiasis, IC   Current NSAID/anticoagulation:   none   At her visit on 09/30/2018, she is complaining of frequency, urgency, dysuria, nocturia and gross hematuria.  She is having 5-7/10 pain in her right back and right groin associated with bladder pressure.  She does not have a strainer.  She finished the Keflex two days ago.  She states that the right flank pain abated while she was on the antibiotic, but it has returned.  Patient denies any fevers, chills, nausea or vomiting.   She does not feel that she has passed a stone at this time.  Her UA was positive for 11-30 RBC's and moderate bacteria.  Urine culture  was positive for beta hemolytic streptococcus, group B.  She was given tamsulosin, Ceftin, Uribell and Percocet.    Follow up KUB on 09/25/2018 did not note any stones, but patient was still symptomatic.  Therefore, a CT Renal stone study was repeated.  CT Renal Stone study on 10/09/2018 noted punctate bilateral renal calculi. No evidence of ureteral calculi, hydronephrosis, or other acute findings.  Today, she is still having frequency, painful urination, nocturia and gross hematuria.  She states the pain with urination occurs in her right flank area.  She describes the pain as "feeling like her kidney is swollen."    The hematuria occurred earlier in the week perhaps with the passage of the stone.  She was not able to catch a fragment.   She has been having nausea as well.  Patient denies any fevers, chills or vomiting.   She is concerned that she may still have an UTI. Her UA is bland.    She has been given Rocephin IV and oral Keflex on 09/01/2018 for a pyelonephritis.  UA at that time was nitrite positive, > 50 RBC's and > WBC's.  No culture was performed.  On 09/11/2018, she was still having symptoms and was given Ceftin.  Urine culture was negative.  She complained of symptoms on 09/25/2018 and urine culture was positive for Beta hemolytic streptococcus, group B and was given Augmentin.      PMH: Past Medical History:  Diagnosis Date  .  ADHD (attention deficit hyperactivity disorder)   . Allergy   . Anxiety   . Bipolar 2 disorder (HCC)   . Blood transfusion as the cause of abnormal reaction of patient, or of later complication, without misadventure at the time of procedure    lost to much blood during delivery  . Depression   . Interstitial cystitis   . Kidney stones   . PCOS (polycystic ovarian syndrome)   . Sleep apnea     Surgical History: Past Surgical History:  Procedure Laterality Date  . interstitial cystitis laparoscopy    . KIDNEY STONE SURGERY     basket, lithotripsy   . KNEE ARTHROSCOPY Right   . TONSILLECTOMY AND ADENOIDECTOMY    . TUBAL LIGATION      Home Medications:  Allergies as of 10/09/2018      Reactions   Latex Anaphylaxis   Penicillins Nausea And Vomiting      Medication List       Accurate as of October 09, 2018  1:42 PM. If you have any questions, ask your nurse or doctor.        STOP taking these medications   amoxicillin-clavulanate 875-125 MG tablet Commonly known as: AUGMENTIN Stopped by: Tanay Misuraca, PA-C   cefUROXime 500 MG tablet Commonly known as: CEFTIN Stopped by: Alexi Dorminey, PA-C   cyclobenzaprine 10 MG tablet Commonly known as: FLEXERIL Stopped by: Talia Hoheisel, PA-C   valACYclovir 1000 MG tablet Commonly known as: VALTREX Stopped by: Michiel CowboySHANNON Jamisen Duerson, PA-C     TAKE these medications   metFORMIN 500 MG 24 hr tablet Commonly known as: GLUCOPHAGE-XR Take 500 mg by mouth 2 (two) times daily.   omeprazole 40 MG capsule Commonly known as: PRILOSEC Take 40 mg by mouth daily.   oxyCODONE-acetaminophen 10-325 MG tablet Commonly known as: Percocet Take 1 tablet by mouth every 6 (six) hours as needed for pain.   phentermine 37.5 MG tablet Commonly known as: ADIPEX-P Take 37.5 mg by mouth daily before breakfast.   spironolactone 50 MG tablet Commonly known as: ALDACTONE TAKE 1 TABLET (50 MG TOTAL) BY MOUTH 3 (THREE) TIMES DAILY. INCREASE FROM 25 MG   Ustell 120 MG Caps Take 1 capsule (120 mg total) by mouth 2 (two) times a day.       Allergies:  Allergies  Allergen Reactions  . Latex Anaphylaxis  . Penicillins Nausea And Vomiting    Family History: Family History  Problem Relation Age of Onset  . Hyperlipidemia Mother   . Hypertension Mother   . Hypertension Father   . Hyperlipidemia Father   . Hyperlipidemia Sister   . Hyperlipidemia Maternal Grandfather   . Hypertension Maternal Grandfather   . Hypertension Paternal Grandmother   . Hyperlipidemia Paternal Grandmother   .  Hyperlipidemia Paternal Grandfather   . Hypertension Paternal Grandfather     Social History:  reports that she has never smoked. She has never used smokeless tobacco. She reports current alcohol use. She reports that she does not use drugs.  ROS: UROLOGY Frequent Urination?: Yes Hard to postpone urination?: No Burning/pain with urination?: Yes Get up at night to urinate?: Yes Leakage of urine?: No Urine stream starts and stops?: No Trouble starting stream?: No Do you have to strain to urinate?: No Blood in urine?: Yes Urinary tract infection?: Yes Sexually transmitted disease?: No Injury to kidneys or bladder?: No Painful intercourse?: No Weak stream?: No Currently pregnant?: No Vaginal bleeding?: No Last menstrual period?: n  Gastrointestinal Nausea?: Yes Vomiting?: No  Indigestion/heartburn?: No Diarrhea?: No Constipation?: No  Constitutional Fever: No Night sweats?: No Weight loss?: No Fatigue?: No  Skin Skin rash/lesions?: No Itching?: No  Eyes Blurred vision?: No Double vision?: No  Ears/Nose/Throat Sore throat?: No Sinus problems?: No  Hematologic/Lymphatic Swollen glands?: No Easy bruising?: No  Cardiovascular Leg swelling?: No Chest pain?: No  Respiratory Cough?: No Shortness of breath?: No  Endocrine Excessive thirst?: No  Musculoskeletal Back pain?: No Joint pain?: No  Neurological Headaches?: No Dizziness?: No  Psychologic Depression?: No Anxiety?: No  Physical Exam: BP 131/84 (BP Location: Left Arm, Patient Position: Sitting, Cuff Size: Normal)   Pulse 73   Ht 5\' 2"  (1.575 m)   Wt 200 lb (90.7 kg)   LMP 09/11/2018 Comment: neg preg test  BMI 36.58 kg/m   Constitutional:  Well nourished. Alert and oriented, No acute distress. HEENT: [place] AT, moist mucus membranes.  Trachea midline, no masses. Cardiovascular: No clubbing, cyanosis, or edema. Respiratory: Normal respiratory effort, no increased work of breathing.  Neurologic: Grossly intact, no focal deficits, moving all 4 extremities. Psychiatric: Normal mood and affect.  Laboratory Data: Lab Results  Component Value Date   WBC 11.3 (H) 09/01/2018   HGB 14.7 09/01/2018   HCT 43.5 09/01/2018   MCV 93.3 09/01/2018   PLT 352 09/01/2018    Lab Results  Component Value Date   CREATININE 0.76 09/01/2018    No results found for: PSA  No results found for: TESTOSTERONE  No results found for: HGBA1C  Lab Results  Component Value Date   TSH 1.998 05/07/2008       Component Value Date/Time   CHOL 167 05/07/2008 2058   HDL 58 05/07/2008 2058   CHOLHDL 2.9 Ratio 05/07/2008 2058   VLDL 21 05/07/2008 2058   LDLCALC 88 05/07/2008 2058    Lab Results  Component Value Date   AST 15 09/01/2018   Lab Results  Component Value Date   ALT 15 09/01/2018   No components found for: ALKALINEPHOPHATASE No components found for: BILIRUBINTOTAL  No results found for: ESTRADIOL  Urinalysis See hpi and epic. I have reviewed the labs.   Pertinent Imaging: CLINICAL DATA:  Dysuria and flank pain  EXAM: CT ABDOMEN AND PELVIS WITHOUT CONTRAST  TECHNIQUE: Multidetector CT imaging of the abdomen and pelvis was performed following the standard protocol without oral or IV contrast.  COMPARISON:  September 07, 2013  FINDINGS: Lower chest: Lung bases are clear.  Hepatobiliary: There are no appreciable focal liver lesions on this noncontrast enhanced study. The gallbladder wall is not appreciably thickened. There is no biliary duct dilatation.  Pancreas: There is no pancreatic mass or inflammatory focus.  Spleen: No splenic lesions are evident.  Adrenals/Urinary Tract: Adrenals bilaterally appear normal. Right kidney is subtly edematous. There is no evident renal mass on either side. There is mild hydronephrosis on the right. There is no hydronephrosis on the left. There is a 2 mm calculus in the mid right kidney. There is a 1 mm  calculus in the lower pole of the left kidney. There is a 2 mm calculus in the distal right ureter at the inferior acetabular level. No other ureteral calculi are appreciable. Urinary bladder is midline with wall thickness within normal limits.  Stomach/Bowel: There is no appreciable bowel wall or mesenteric thickening. There is no evident bowel obstruction. Terminal ileum appears unremarkable. There is no evident free air or portal venous air.  Vascular/Lymphatic: There is no abdominal aortic aneurysm. No evident vascular lesions  on this noncontrast enhanced study. There is no evident adenopathy in the abdomen or pelvis.  Reproductive: The uterus is anteverted. There is an apparent dominant follicle arising from the right ovary measuring 2.5 x 2.2 cm. No other pelvic mass evident. There is an apparent tubal ligation clip in the midline dependent portion of the pelvis. Only a single clip is demonstrable.  Other: The appendix is normal in size and contour. There are apparent small appendicoliths within the appendix. There is no periappendiceal region inflammation. There is no evident abscess or ascites in the abdomen or pelvis.  Musculoskeletal: There are no blastic or lytic bone lesions. There is no intramuscular or abdominal wall lesion evident.  IMPRESSION: 1. 2 mm calculus in the distal right ureter with mild hydronephrosis on the right.  2.  Small nonobstructing calculus in each kidney.  3. No evident bowel obstruction. No abscess in the abdomen or pelvis. No appendiceal inflammation evident.  4. Apparent tubal ligation clip in the midline of the posterior pelvis. This clip is likely displaced from the fallopian tube. Only a single clip is evident.   Electronically Signed   By: Bretta BangWilliam  Woodruff III M.D.   On: 09/01/2018 19:29  CLINICAL DATA:  Persistent right flank pain and dysuria. Recent right ureteral calculus.  EXAM: CT ABDOMEN AND PELVIS  WITHOUT CONTRAST  TECHNIQUE: Multidetector CT imaging of the abdomen and pelvis was performed following the standard protocol without IV contrast.  COMPARISON:  09/01/2018  FINDINGS: Lower chest: No acute findings.  Hepatobiliary: No mass visualized on this unenhanced exam. Gallbladder is unremarkable.  Pancreas: No mass or inflammatory process visualized on this unenhanced exam.  Spleen:  Within normal limits in size.  Adrenals/Urinary tract: Several punctate calculi are seen in both kidneys. No evidence of ureteral calculi or hydronephrosis on today's exam. Unremarkable unopacified urinary bladder.  Stomach/Bowel: No evidence of obstruction, inflammatory process, or abnormal fluid collections. Normal appendix visualized.  Vascular/Lymphatic: No pathologically enlarged lymph nodes identified. No evidence of abdominal aortic aneurysm.  Reproductive: No mass or other significant abnormality. Tubal ligation clip again seen in the pelvic cul-de-sac.  Other:  None.  Musculoskeletal:  No suspicious bone lesions identified.  IMPRESSION: Punctate bilateral renal calculi. No evidence of ureteral calculi, hydronephrosis, or other acute findings.   Electronically Signed   By: Danae OrleansJohn A Stahl M.D.   On: 10/08/2018 11:43  I have independently reviewed the films and do not appreciate an ureteral stone or hydronephrosis.    Assessment & Plan:    1. Right ureteral stone Not demonstrated on CT Renal stone study on 10/08/2018  2. Right hydronephrosis CT Renal stone study dated 10/08/2018 confirms hydronephrosis has resolved with the passage of the stone  3. Gross hematuria UA today demonstrates no RBC's  4. rUTI's Explained to patient that we need to CATH her for an UA at this time as she has had been on several antibiotics in the last month and we need the UA to be as sterile as possible for culture.  She defers at this time as she states that her IC is  exacerbated by instrumentation and she is not wanting another antibiotic at this time.  She would like to give it a few more days to see if her symptoms abate on their own.      Return for will contact patient on MyChart in one week .  These notes generated with voice recognition software. I apologize for typographical errors.  Michiel CowboySHANNON Arleen Bar, PA-C  [facility]  [address] [place], [place] [id number] [phone]

## 2018-10-09 ENCOUNTER — Encounter: Payer: Self-pay | Admitting: Urology

## 2018-10-09 ENCOUNTER — Ambulatory Visit: Payer: Commercial Managed Care - PPO | Admitting: Urology

## 2018-10-09 VITALS — BP 131/84 | HR 73 | Ht 62.0 in | Wt 200.0 lb

## 2018-10-09 DIAGNOSIS — N201 Calculus of ureter: Secondary | ICD-10-CM

## 2018-10-09 DIAGNOSIS — R310 Gross hematuria: Secondary | ICD-10-CM | POA: Diagnosis not present

## 2018-10-09 DIAGNOSIS — N390 Urinary tract infection, site not specified: Secondary | ICD-10-CM | POA: Diagnosis not present

## 2018-10-09 LAB — MICROSCOPIC EXAMINATION: RBC, Urine: NONE SEEN /hpf (ref 0–2)

## 2018-10-09 LAB — URINALYSIS, COMPLETE
Bilirubin, UA: NEGATIVE
Glucose, UA: NEGATIVE
Ketones, UA: NEGATIVE
Leukocytes,UA: NEGATIVE
Nitrite, UA: NEGATIVE
Protein,UA: NEGATIVE
Specific Gravity, UA: 1.02 (ref 1.005–1.030)
Urobilinogen, Ur: 0.2 mg/dL (ref 0.2–1.0)
pH, UA: 6 (ref 5.0–7.5)

## 2018-11-05 ENCOUNTER — Encounter: Payer: Self-pay | Admitting: Physician Assistant

## 2018-11-05 ENCOUNTER — Ambulatory Visit (INDEPENDENT_AMBULATORY_CARE_PROVIDER_SITE_OTHER): Payer: Commercial Managed Care - PPO | Admitting: Physician Assistant

## 2018-11-05 ENCOUNTER — Ambulatory Visit
Admission: RE | Admit: 2018-11-05 | Discharge: 2018-11-05 | Disposition: A | Discharge status: Home / Self Care | Payer: Commercial Managed Care - PPO | Source: Ambulatory Visit | Attending: Physician Assistant | Admitting: Physician Assistant

## 2018-11-05 ENCOUNTER — Other Ambulatory Visit: Payer: Self-pay

## 2018-11-05 VITALS — BP 122/80 | HR 89 | Ht 62.0 in | Wt 214.0 lb

## 2018-11-05 DIAGNOSIS — R109 Unspecified abdominal pain: Secondary | ICD-10-CM | POA: Insufficient documentation

## 2018-11-05 DIAGNOSIS — N200 Calculus of kidney: Secondary | ICD-10-CM | POA: Insufficient documentation

## 2018-11-05 DIAGNOSIS — R300 Dysuria: Secondary | ICD-10-CM

## 2018-11-05 DIAGNOSIS — N390 Urinary tract infection, site not specified: Secondary | ICD-10-CM | POA: Diagnosis not present

## 2018-11-05 DIAGNOSIS — N12 Tubulo-interstitial nephritis, not specified as acute or chronic: Secondary | ICD-10-CM

## 2018-11-05 DIAGNOSIS — R10A0 Flank pain, unspecified side: Secondary | ICD-10-CM

## 2018-11-05 LAB — URINALYSIS, COMPLETE
Bilirubin, UA: NEGATIVE
Glucose, UA: NEGATIVE
Ketones, UA: NEGATIVE
Leukocytes,UA: NEGATIVE
Nitrite, UA: NEGATIVE
Protein,UA: NEGATIVE
Specific Gravity, UA: 1.02 (ref 1.005–1.030)
Urobilinogen, Ur: 0.2 mg/dL (ref 0.2–1.0)
pH, UA: 6 (ref 5.0–7.5)

## 2018-11-05 LAB — MICROSCOPIC EXAMINATION: WBC, UA: NONE SEEN /hpf (ref 0–5)

## 2018-11-05 MED ORDER — SULFAMETHOXAZOLE-TRIMETHOPRIM 800-160 MG PO TABS: 1.0000 | ORAL_TABLET | Freq: Two times a day (BID) | ORAL | 0 refills | Status: AC

## 2018-11-05 NOTE — Progress Notes (Signed)
11/05/2018 4:22 PM   [name] 05/14/1985 [id number]  CC: Left flank pain, dysuria  HPI: [name] is a 33 y.o. female who presents to the clinic for evaluation of possible acute stone episode. She is an established [facility] patient who last saw Michiel CowboyShannon McGowan on 10/09/2018 for follow-up of recurrent nephrolithiasis with CT stone study results and UA.  Patient sought care in the ED on 09/01/2018 for dysuria and right-sided flank pain. She was found to have a 2mm distal right ureteral calculus with mild right hydronephrosis and right pyelonephritis. She was sent home on cephalexin and a trial of passage. Follow-up CT stone study on 7/1 with no bilateral ureteral stones did show bilateral punctate renal calculi, R>L.  Patient states that around the time of her illness onset in May, she developed constant, 4/10 left sided flank pain. She states "it's like my kidney is swollen." Pain occasionally worsens to 8-9/10 in intensity that lasts several minutes. These acute exacerbations have been happening with increasing frequency and now are occurring daily. She denies vomiting, fevers, and chills, but does report some nausea, dysuria, and intermittent gross hematuria.   Patient states she has been unable to work for the past 2.5 months due to the pain. She reports she has been taking prescribed Percocet for her most severe pain with some symptom palliation but does report some pruritis with this medication.  Patient has a history of probable IC and wonders if her pain is consistent with a flare of this. She states her typical IC symptoms in the past have been suprapubic pain and bladder spasms. She denies these symptoms at this time. Additionally, she notes she has been taking Prelief dietary supplement for the past 2 weeks but does not notice any improvement in her symptoms.  UA dipstick in office with 3+ blood but otherwise negative; microscopy with 11-30 RBCs/HPF and few bacteria.  Urine culture pending.  Antibiotics in the past 3 months include ceftriaxone and cephalexin. She asks if Darvocet is still available, as it's the only medication that has ever helped with her pain.  Patient also reports a prolonged urological history dating back to childhood featuring recurrent UTIs with gross hematuria between the ages of 1 and [age] and nephrolithiasis starting at the age of 33. No notes available to investigate this history. She does not recall ever undergoing a VCUG or metabolic evaluation for these. Detailed chart review reveals the notable urological history below: Date Event Provider  July 2003 (age 33) Several right renal stones during pregnancy identified per US. Spontaneously passed some, however persistent right hydronephrosis led to URS with stent placement. Difficulty advancing past right UPJ. Note states patient had PMH nephrolithiasis at that point, no original notes available. Garnett FarmMark C Ottelin, MD [facility]  02/16/2005 (age 33) Cystourethroscopy with bladder distention and bladder biopsy revealed "significant glomerulations...throughout the bladder, consistent with interstitial cystitis." Laurin Coderonald L Davis, MD  04/08/2008 (age 40) Right pyelonephritis requiring admission, no stones on KUB Lonia BloodJeffrey T McClung, MD  07/20/2014 (age 89) ED visit with right ureteral stone per KUB, UA and culture reassuring for infection. No bilateral hydronephrosis per US. Ansel BongBenjamin O Linthicum, FNP  09/01/2015 (age 33) ED visit with 6057-month h/o abdominal and flank pain. Reassuring UA, culture, and renal US. Stevie KernJane H Brice, MD  09/01/2018 (age 33) Right pyelonephritis with ureterolithiasis and mild right hydronephrosis (see above). Doyne KeelMatthew R Eskridge, MD   Patient denies recent unintentional weight loss, blood in her stool, and trauma to  her back or abdomen. She is a never smoker. She has an occupational history of managing a textile plant and reports spending time in these factories  as a child, however she does not believe she was ever exposed to industrial dyes in these settings.  Family history significant for an aunt with IC and sister who underwent partial nephrectomy at age 55 for possible RCC that was determined to be benign. She denies a personal or family history of IBD.  PMH: Past Medical History:  Diagnosis Date  . ADHD (attention deficit hyperactivity disorder)   . Allergy   . Anxiety   . Bipolar 2 disorder ([place])   . Blood transfusion as the cause of abnormal reaction of patient, or of later complication, without misadventure at the time of procedure    lost to much blood during delivery  . Depression   . Interstitial cystitis   . Kidney stones   . PCOS (polycystic ovarian syndrome)   . Sleep apnea     Surgical History: Past Surgical History:  Procedure Laterality Date  . interstitial cystitis laparoscopy    . KIDNEY STONE SURGERY     basket, lithotripsy  . KNEE ARTHROSCOPY Right   . TONSILLECTOMY AND ADENOIDECTOMY    . TUBAL LIGATION      Home Medications:  Allergies as of 11/05/2018      Reactions   Latex Anaphylaxis   Penicillins Nausea And Vomiting      Medication List       Accurate as of November 05, 2018  4:22 PM. If you have any questions, ask your nurse or doctor.        STOP taking these medications   omeprazole 40 MG capsule Commonly known as: PRILOSEC Stopped by: Debroah Loop, PA-C     TAKE these medications   metFORMIN 500 MG 24 hr tablet Commonly known as: GLUCOPHAGE-XR Take 500 mg by mouth 2 (two) times daily.   oxyCODONE-acetaminophen 10-325 MG tablet Commonly known as: Percocet Take 1 tablet by mouth every 6 (six) hours as needed for pain.   phentermine 37.5 MG tablet Commonly known as: ADIPEX-P Take 37.5 mg by mouth daily before breakfast.   spironolactone 50 MG tablet Commonly known as: ALDACTONE TAKE 1 TABLET (50 MG TOTAL) BY MOUTH 3 (THREE) TIMES DAILY. INCREASE FROM 25 MG    sulfamethoxazole-trimethoprim 800-160 MG tablet Commonly known as: BACTRIM DS Take 1 tablet by mouth 2 (two) times daily for 7 days. Started by: Debroah Loop, PA-C   Ustell 120 MG Caps Take 1 capsule (120 mg total) by mouth 2 (two) times a day.       Allergies:  Allergies  Allergen Reactions  . Latex Anaphylaxis  . Penicillins Nausea And Vomiting    Family History: Family History  Problem Relation Age of Onset  . Hyperlipidemia Mother   . Hypertension Mother   . Hypertension Father   . Hyperlipidemia Father   . Hyperlipidemia Sister   . Hyperlipidemia Maternal Grandfather   . Hypertension Maternal Grandfather   . Hypertension Paternal Grandmother   . Hyperlipidemia Paternal Grandmother   . Hyperlipidemia Paternal Grandfather   . Hypertension Paternal Grandfather     Social History:  reports that she has never smoked. She has never used smokeless tobacco. She reports current alcohol use. She reports that she does not use drugs.  ROS: UROLOGY Frequent Urination?: Yes Hard to postpone urination?: No Burning/pain with urination?: Yes Get up at night to urinate?: Yes Leakage of urine?: No Urine  stream starts and stops?: No Trouble starting stream?: No Do you have to strain to urinate?: No Blood in urine?: No Urinary tract infection?: No Sexually transmitted disease?: No Injury to kidneys or bladder?: No Painful intercourse?: No Weak stream?: No Currently pregnant?: No Vaginal bleeding?: No Last menstrual period?: n  Gastrointestinal Nausea?: No Vomiting?: No Indigestion/heartburn?: No Diarrhea?: No Constipation?: No  Constitutional Fever: No Night sweats?: No Weight loss?: No Fatigue?: No  Skin Skin rash/lesions?: No Itching?: No  Eyes Blurred vision?: No Double vision?: No  Ears/Nose/Throat Sore throat?: No Sinus problems?: No  Hematologic/Lymphatic Swollen glands?: No Easy bruising?: No  Cardiovascular Leg swelling?: No  Chest pain?: No  Respiratory Cough?: No Shortness of breath?: No  Endocrine Excessive thirst?: No  Musculoskeletal Back pain?: No Joint pain?: No  Neurological Headaches?: No Dizziness?: No  Psychologic Depression?: No Anxiety?: No  Physical Exam: BP 122/80   Pulse 89   Ht 5\' 2"  (1.575 m)   Wt 214 lb (97.1 kg)   BMI 39.14 kg/m   Constitutional:  Alert and oriented, no acute distress, nontoxic appearing. Cardiovascular: No clubbing, cyanosis, or edema. Respiratory: Normal respiratory effort, no increased work of breathing. Skin: No rashes, bruises or suspicious lesions. Neurologic: Grossly intact, no focal deficits, moving all 4 extremities. Psychiatric: Normal mood and affect.  Laboratory Data: Lab Results  Component Value Date   WBC 11.3 (H) 09/01/2018   HGB 14.7 09/01/2018   HCT 43.5 09/01/2018   MCV 93.3 09/01/2018   PLT 352 09/01/2018   Lab Results  Component Value Date   CREATININE 0.76 09/01/2018   Urinalysis    Component Value Date/Time   COLORURINE AMBER (A) 09/01/2018 1736   APPEARANCEUR Clear 11/05/2018 1356   LABSPEC 1.011 09/01/2018 1736   PHURINE 6.0 09/01/2018 1736   GLUCOSEU Negative 11/05/2018 1356   HGBUR LARGE (A) 09/01/2018 1736   HGBUR moderate 01/03/2007 1430   BILIRUBINUR Negative 11/05/2018 1356   KETONESUR NEGATIVE 09/01/2018 1736   PROTEINUR Negative 11/05/2018 1356   PROTEINUR 100 (A) 09/01/2018 1736   UROBILINOGEN 0.2 02/10/2009 2235   NITRITE Negative 11/05/2018 1356   NITRITE POSITIVE (A) 09/01/2018 1736   LEUKOCYTESUR Negative 11/05/2018 1356   LEUKOCYTESUR LARGE (A) 09/01/2018 1736    Results for orders placed or performed in visit on 11/05/18  Microscopic Examination     Status: Abnormal   Collection Time: 11/05/18  1:56 PM   URINE  Result Value Ref Range Status   WBC, UA None seen 0 - 5 /hpf Final   RBC 11-30 (A) 0 - 2 /hpf Final   Epithelial Cells (non renal) 0-10 0 - 10 /hpf Final   Mucus, UA Present (A)  Not Estab. Final   Bacteria, UA Few None seen/Few Final   Pertinent Imaging: Results for orders placed during the hospital encounter of 10/08/18  CT RENAL STONE STUDY   Narrative CLINICAL DATA:  Persistent right flank pain and dysuria. Recent right ureteral calculus.  EXAM: CT ABDOMEN AND PELVIS WITHOUT CONTRAST  TECHNIQUE: Multidetector CT imaging of the abdomen and pelvis was performed following the standard protocol without IV contrast.  COMPARISON:  09/01/2018  FINDINGS: Lower chest: No acute findings.  Hepatobiliary: No mass visualized on this unenhanced exam. Gallbladder is unremarkable.  Pancreas: No mass or inflammatory process visualized on this unenhanced exam.  Spleen:  Within normal limits in size.  Adrenals/Urinary tract: Several punctate calculi are seen in both kidneys. No evidence of ureteral calculi or hydronephrosis on today's exam. Unremarkable  unopacified urinary bladder.  Stomach/Bowel: No evidence of obstruction, inflammatory process, or abnormal fluid collections. Normal appendix visualized.  Vascular/Lymphatic: No pathologically enlarged lymph nodes identified. No evidence of abdominal aortic aneurysm.  Reproductive: No mass or other significant abnormality. Tubal ligation clip again seen in the pelvic cul-de-sac.  Other:  None.  Musculoskeletal:  No suspicious bone lesions identified.  IMPRESSION: Punctate bilateral renal calculi. No evidence of ureteral calculi, hydronephrosis, or other acute findings.   Electronically Signed   By: Danae OrleansJohn A Stahl M.D.   On: 10/08/2018 11:43    US RENAL CLINICAL DATA:  BILATERAL nephrolithiasis, LEFT flank pain for 1.5 months  EXAM: RENAL / URINARY TRACT ULTRASOUND COMPLETE  COMPARISON:  CT abdomen pelvis 10/08/2018  FINDINGS: Right Kidney:  Renal measurements: 10.6 x 4.1 x 4.9 cm = volume: 110 mL. Cortical thinning. Normal cortical echogenicity. No mass, hydronephrosis or shadowing  calcification.  Left Kidney:  Renal measurements: 10.0 x 4.9 x 5.0 cm. = volume: 128 mL. Normal cortical thickness and echogenicity. No mass, hydronephrosis, or shadowing calcification.  Bladder:  Appears normal for degree of bladder distention.  IMPRESSION: Cortical thinning of RIGHT kidney.  Otherwise negative exam.  Specifically, no evidence of LEFT hydronephrosis.  Electronically Signed   By: Ulyses SouthwardMark  Boles M.D.   On: 11/05/2018 15:49   I personally reviewed the images referenced above and agree with the radiologic findings.  Assessment & Plan:    1. Dysuria & left flank pain Patient underwent CT stone study approximately one month ago which showed only punctate stones on the left side. Deferring CT or KUB today; I do not expect changes in her stone status that are sufficient enough to warrant the extra radiation exposure.  STAT renal US today with no hydronephrosis reassuring for urinary obstruction. Microscopic hematuria with new bacteria on UA. This may be consistent with UTI, antibiotics prescribed today however I counseled the patient that I would advise her to stop these if her urine culture comes back negative.  I cannot determine any obvious cause of her left sided pain at this time, however I do believe the patient has a sufficiently complex urological history to continue workup for structural abnormalities at this time. See below.  I did not prescribe pain medication today; she states she still has Percocet from her acute stone episode and follow-up. I counseled the patient that she could take diphenhydramine for pruritis relief when she takes previously-prescribed narcotic pain medication. I reminded her not to drive or operate heavy machinery when she does this. She expressed an understanding of this plan. - Urinalysis, Complete - Urine culture - Renal US - Bactrim DS BID x7 days  2. Recurrent nephrolithiasis and urinary tract infections with hematuria Renal US  today with some cortical thinning on the right side. I am concerned for possible renal damage given patient's complex urological history (e.g. recurrent pediatric UTIs, recurrent nephrolithiasis starting in childhood, recurrent right-sided pyelonephritis). Additionally, patient with microscopic hematuria on UA today. While this may be attributable to infection, IC, or nephrolithiasis, she would benefit from hematuria workup.  Differential diagnosis includes VUR, ureteral stricture, urinary tract lesions, and atypical presentation of IC. I will order VCUG today to evaluate for VUR and cystoscopy to evaluate for bladder lesions. If VCUG is negative, patient may benefit from CT urogram to evaluate for upper tract abnormalities. Patient should undergo Litholink evaluation for recurrent stones in the future; will defer this for now pending other results. - VCUG - Cystoscopy  Carman ChingSamantha Tsuneo Faison, PA-C  [facility] [address], [place], [place] [address] [phone]

## 2018-11-06 ENCOUNTER — Telehealth: Payer: Self-pay | Admitting: Physician Assistant

## 2018-11-06 NOTE — Telephone Encounter (Signed)
Pt called and would like a call back with her [place] results also she would like to talk to someone about the VCUG. Please advise.

## 2018-11-06 NOTE — Telephone Encounter (Signed)
Patient and I just spoke over the phone to discuss results of her renal US. I explained that there is no swelling in the kidney that is concerning for obstruction right now, however she does have some cortical narrowing on the right side that may have been caused by recurrent infections in that kidney. I informed her that I have ordered a VCUG and cystoscopy to evaluate her for possible urinary reflux and rule out any bladder lesions that may be causing her microscopic hematuria. I explained that I would like her to schedule these tests in the coming weeks and then come back to see me in clinic to discuss her results. She expressed an understanding of this plan.

## 2018-11-06 NOTE — Telephone Encounter (Signed)
Pt called to get Cysto scheduled, pt asked to have something for anxiety to be called in for her prior to the procedure. Please advise. Thanks.

## 2018-11-06 NOTE — Telephone Encounter (Signed)
Please call patient to schedule cysto and a follow-up with me. I would like her to obtain this and VCUG prior seeing me.

## 2018-11-07 ENCOUNTER — Other Ambulatory Visit: Payer: Self-pay | Admitting: Physician Assistant

## 2018-11-07 LAB — URINE CULTURE

## 2018-11-07 MED ORDER — DIAZEPAM 5 MG PO TABS: 5.0000 mg | ORAL_TABLET | ORAL | 0 refills | Status: DC | PRN

## 2018-11-07 NOTE — Telephone Encounter (Signed)
Left pt mess notifying and mychart sent

## 2018-11-07 NOTE — Telephone Encounter (Signed)
Please contact the patient to inform her that I sent a prescription to her pharmacy. She should take the medication 30-60 minutes prior to her scheduled cystoscopy. She should not combine the medication with narcotic pain meds. She will require a driver on the day of cystoscopy, as she cannot drive or operate heavy machinery when on this medication.

## 2018-11-07 NOTE — Telephone Encounter (Signed)
She is scheduled on 11-14-18 and her follow up with you is not until 11-28-18 do you want to see her sooner than that?   Sharyn Lull

## 2018-11-14 ENCOUNTER — Other Ambulatory Visit: Payer: Self-pay

## 2018-11-14 ENCOUNTER — Ambulatory Visit
Admission: RE | Admit: 2018-11-14 | Discharge: 2018-11-14 | Disposition: A | Discharge status: Home / Self Care | Payer: Commercial Managed Care - PPO | Source: Ambulatory Visit | Attending: Physician Assistant | Admitting: Physician Assistant

## 2018-11-14 DIAGNOSIS — N12 Tubulo-interstitial nephritis, not specified as acute or chronic: Secondary | ICD-10-CM | POA: Insufficient documentation

## 2018-11-14 DIAGNOSIS — N390 Urinary tract infection, site not specified: Secondary | ICD-10-CM | POA: Diagnosis present

## 2018-11-14 MED ORDER — IOTHALAMATE MEGLUMINE 17.2 % UR SOLN
150.0000 mL | Freq: Once | URETHRAL | Status: AC | PRN
  Administered 2018-11-14: 150 mL via INTRAVESICAL

## 2018-11-27 ENCOUNTER — Ambulatory Visit (INDEPENDENT_AMBULATORY_CARE_PROVIDER_SITE_OTHER): Payer: Commercial Managed Care - PPO | Admitting: Urology

## 2018-11-27 ENCOUNTER — Other Ambulatory Visit: Payer: Self-pay

## 2018-11-27 ENCOUNTER — Encounter: Payer: Self-pay | Admitting: Urology

## 2018-11-27 VITALS — BP 138/81 | HR 106 | Ht 63.0 in | Wt 218.0 lb

## 2018-11-27 DIAGNOSIS — R3129 Other microscopic hematuria: Secondary | ICD-10-CM

## 2018-11-27 DIAGNOSIS — R310 Gross hematuria: Secondary | ICD-10-CM

## 2018-11-27 DIAGNOSIS — R102 Pelvic and perineal pain: Secondary | ICD-10-CM

## 2018-11-27 LAB — URINALYSIS, COMPLETE
Bilirubin, UA: NEGATIVE
Glucose, UA: NEGATIVE
Ketones, UA: NEGATIVE
Leukocytes,UA: NEGATIVE
Nitrite, UA: NEGATIVE
Protein,UA: NEGATIVE
RBC, UA: NEGATIVE
Specific Gravity, UA: 1.025 (ref 1.005–1.030)
Urobilinogen, Ur: 0.2 mg/dL (ref 0.2–1.0)
pH, UA: 5.5 (ref 5.0–7.5)

## 2018-11-27 LAB — MICROSCOPIC EXAMINATION: RBC, Urine: NONE SEEN /hpf (ref 0–2)

## 2018-11-27 NOTE — Patient Instructions (Signed)
Cystoscopy Procedure Note:  Indication: Hematuria, pelvic pain  After informed consent and discussion of the procedure and its risks, [name] was positioned and prepped in the standard fashion. Cystoscopy was performed with a flexible cystoscope. The urethra, bladder neck and entire bladder was visualized in a standard fashion. The ureteral orifices were visualized in their normal location and orientation. Very subtle erythema at posterior wall, otherwise mucosa grossly normal.  Findings: Essentially normal cystoscopy  Assessment and Plan: Agree with trial of Amitriptyline started by Gyn Keep follow up with Bailey Kea, PA re:IC type symptoms  Nickolas Madrid, MD 11/27/2018

## 2018-11-28 ENCOUNTER — Ambulatory Visit: Payer: Commercial Managed Care - PPO | Admitting: Physician Assistant

## 2018-12-28 ENCOUNTER — Other Ambulatory Visit: Payer: Self-pay | Admitting: Urology

## 2019-03-03 ENCOUNTER — Other Ambulatory Visit: Payer: Self-pay | Admitting: Urology

## 2019-03-03 DIAGNOSIS — N3010 Interstitial cystitis (chronic) without hematuria: Secondary | ICD-10-CM

## 2020-07-01 ENCOUNTER — Other Ambulatory Visit: Payer: Self-pay

## 2020-07-01 ENCOUNTER — Encounter: Payer: Self-pay | Admitting: Nurse Practitioner

## 2020-07-01 ENCOUNTER — Ambulatory Visit: Payer: BC Managed Care – PPO | Admitting: Nurse Practitioner

## 2020-07-01 VITALS — BP 126/79 | HR 71 | Temp 97.8°F | Wt 236.8 lb

## 2020-07-01 DIAGNOSIS — E282 Polycystic ovarian syndrome: Secondary | ICD-10-CM | POA: Diagnosis not present

## 2020-07-01 DIAGNOSIS — L729 Follicular cyst of the skin and subcutaneous tissue, unspecified: Secondary | ICD-10-CM | POA: Insufficient documentation

## 2020-07-01 DIAGNOSIS — Z23 Encounter for immunization: Secondary | ICD-10-CM

## 2020-07-01 DIAGNOSIS — E669 Obesity, unspecified: Secondary | ICD-10-CM | POA: Insufficient documentation

## 2020-07-01 DIAGNOSIS — Z1159 Encounter for screening for other viral diseases: Secondary | ICD-10-CM

## 2020-07-01 DIAGNOSIS — Z114 Encounter for screening for human immunodeficiency virus [HIV]: Secondary | ICD-10-CM | POA: Diagnosis not present

## 2020-07-01 DIAGNOSIS — Z6841 Body Mass Index (BMI) 40.0 and over, adult: Secondary | ICD-10-CM

## 2020-07-01 NOTE — Assessment & Plan Note (Addendum)
Suspect lipoma to scalp x 2 (one growing in size), will refer to general surgery for evaluation and removal if recommend due to increased size and tenderness.  No s/s infection.

## 2020-07-01 NOTE — Progress Notes (Signed)
BP 126/79   Pulse 71   Temp 97.8 F (36.6 C) (Oral)   Wt 236 lb 12.8 oz (107.4 kg)   SpO2 100%   BMI 41.95 kg/m    Subjective:    Patient ID: [name], female    DOB: [birth year], 35 y.o.   MRN: [id number]  HPI: [name] is a 35 y.o. female  Chief Complaint  Patient presents with  . Cyst    Patient states noticed a knot on her right side of head. Patient states they have been there for about 3 years and states that over time it has gotten bigger and it is starting to get tender and she notices another one but it is smaller. Patient would like to discuss having lab work.   . Headache    Patient not sure if it is allergy related or if it has something to do with the knots she has noticed. Patient wanted to be safe than sorry and have them checked out.    She would like to check physical labs today, as came off medications over a year ago -- Metformin, Spironolactone, and Amitriptyline (intersitial cystitis), Ustell.  She has noticed some weight gain, which she reports could be from increased stressors with new baby in house.  CYST Has a cyst to right side of head, had been there 2-3 years without issue.  Is gradually beginning to get larger and more tender + is having some headaches (which she reports could be sinuses due to time of year).  Has a smaller one off to right side, which is how the bigger one started out.  The larger cyst is getting in way when brushes hair.   Duration: months Location: right side of head History of trauma in area: no Pain: yes Quality: yes Severity: mild Redness: no Swelling: yes Oozing: no Pus: no Fevers: no Nausea/vomiting: no Status: fluctuating Treatments attempted:none  Tetanus: Not UTD  Relevant past medical, surgical, family and social history reviewed and updated as indicated. Interim medical history since our last visit reviewed. Allergies and medications reviewed and updated.  Review of Systems   Constitutional: Negative for activity change, appetite change, diaphoresis, fatigue and fever.  Respiratory: Negative for cough, chest tightness and shortness of breath.   Cardiovascular: Negative for chest pain, palpitations and leg swelling.  Gastrointestinal: Negative.   Endocrine: Negative for cold intolerance and heat intolerance.  Neurological: Negative.   Psychiatric/Behavioral: Negative.     Per HPI unless specifically indicated above     Objective:    BP 126/79   Pulse 71   Temp 97.8 F (36.6 C) (Oral)   Wt 236 lb 12.8 oz (107.4 kg)   SpO2 100%   BMI 41.95 kg/m   Wt Readings from Last 3 Encounters:  07/01/20 236 lb 12.8 oz (107.4 kg)  11/27/18 218 lb (98.9 kg)  11/05/18 214 lb (97.1 kg)    Physical Exam Vitals and nursing note reviewed.  Constitutional:      General: She is awake. She is not in acute distress.    Appearance: She is well-developed and well-groomed. She is obese. She is not ill-appearing.  HENT:     Head: Normocephalic. Mass present.      Right Ear: Hearing normal.     Left Ear: Hearing normal.     Nose: Nose normal.     Mouth/Throat:     Mouth: Mucous membranes are moist.  Eyes:     General: Lids are normal.  Right eye: No discharge.        Left eye: No discharge.     Conjunctiva/sclera: Conjunctivae normal.     Pupils: Pupils are equal, round, and reactive to light.  Neck:     Thyroid: No thyromegaly.     Vascular: No carotid bruit or JVD.  Cardiovascular:     Rate and Rhythm: Normal rate and regular rhythm.     Heart sounds: Normal heart sounds. No murmur heard. No gallop.   Pulmonary:     Effort: Pulmonary effort is normal.     Breath sounds: Normal breath sounds.  Abdominal:     General: Bowel sounds are normal.     Palpations: Abdomen is soft.  Musculoskeletal:     Cervical back: Normal range of motion and neck supple.     Right lower leg: No edema.     Left lower leg: No edema.  Lymphadenopathy:     Cervical: No  cervical adenopathy.  Skin:    General: Skin is warm and dry.  Neurological:     Mental Status: She is alert and oriented to person, place, and time.  Psychiatric:        Attention and Perception: Attention normal.        Mood and Affect: Mood normal.        Speech: Speech normal.        Behavior: Behavior normal. Behavior is cooperative.        Thought Content: Thought content normal.    Results for orders placed or performed in visit on 11/27/18  Microscopic Examination   URINE  Result Value Ref Range   WBC, UA 0-5 0 - 5 /hpf   RBC None seen 0 - 2 /hpf   Epithelial Cells (non renal) 0-10 0 - 10 /hpf   Mucus, UA Present (A) Not Estab.   Bacteria, UA Few None seen/Few  Urinalysis, Complete  Result Value Ref Range   Specific Gravity, UA 1.025 1.005 - 1.030   pH, UA 5.5 5.0 - 7.5   Color, UA Yellow Yellow   Appearance Ur Clear Clear   Leukocytes,UA Negative Negative   Protein,UA Negative Negative/Trace   Glucose, UA Negative Negative   Ketones, UA Negative Negative   RBC, UA Negative Negative   Bilirubin, UA Negative Negative   Urobilinogen, Ur 0.2 0.2 - 1.0 mg/dL   Nitrite, UA Negative Negative   Microscopic Examination See below:       Assessment & Plan:   Problem List Items Addressed This Visit      Endocrine   PCOS (polycystic ovarian syndrome)    Chronic, ongoing, no current medications.  She wishes labs today, will obtain CBC, CMP, TSH, lipid, and A1c.      Relevant Orders   CBC with Differential/Platelet   Hemoglobin A1c     Musculoskeletal and Integument   Scalp cyst    Suspect lipoma to scalp x 2 (one growing in size), will refer to general surgery for evaluation and removal if recommend due to increased size and tenderness.  No s/s infection.      Relevant Orders   Ambulatory referral to General Surgery     Other   Obesity - Primary    BMI 41.95.  Recommended eating smaller high protein, low fat meals more frequently and exercising 30 mins a day 5  times a week with a goal of 10-15lb weight loss in the next 3 months. Patient voiced their understanding and motivation to adhere to these  recommendations.  She wishes labs today, will obtain CBC, CMP, TSH, lipid, and A1c.       Relevant Orders   Comprehensive metabolic panel   Lipid Panel w/o Chol/HDL Ratio   TSH   Hemoglobin A1c    Other Visit Diagnoses    Need for hepatitis C screening test       Hep C screening per guideline recommendations for one time screening.   Relevant Orders   Hepatitis C antibody   Encounter for screening for HIV       HIV screening per guideline recommendations for one time screening.   Relevant Orders   HIV Antibody (routine testing w rflx)   Immunization, tetanus-diphtheria       Tetanus vaccine today.   Relevant Orders   Tdap vaccine greater than or equal to 7yo IM (Completed)       Follow up plan: Return in about 6 months (around 01/01/2021) for Follow-up.

## 2020-07-01 NOTE — Patient Instructions (Signed)
Polycystic Ovary Syndrome  Polycystic ovarian syndrome (PCOS) is a common hormonal disorder among women of reproductive age. In most women with PCOS, small fluid-filled sacs (cysts) grow on the ovaries. PCOS can cause problems with menstrual periods and make it hard to get and stay pregnant. If this condition is not treated, it can lead to serious health problems, such as diabetes and heart disease. What are the causes? The cause of this condition is not known. It may be due to certain factors, such as:  Irregular menstrual cycle.  High levels of certain hormones.  Problems with the hormone that helps to control blood sugar (insulin).  Certain genes. What increases the risk? You are more likely to develop this condition if you:  Have a family history of PCOS or type 2 diabetes.  Are overweight, eat unhealthy foods, and are not active. These factors may cause problems with blood sugar control, which can contribute to PCOS or PCOS symptoms. What are the signs or symptoms? Symptoms of this condition include:  Ovarian cysts and sometimes pelvic pain.  Menstrual periods that are not regular or are too heavy.  Inability to get or stay pregnant.  Increased growth of hair on the face, chest, stomach, back, thumbs, thighs, or toes.  Acne or oily skin. Acne may develop during adulthood, and it may not get better with treatment.  Weight gain or obesity.  Patches of thickened and dark brown or black skin on the neck, arms, breasts, or thighs. How is this diagnosed? This condition is diagnosed based on:  Your medical history.  A physical exam that includes a pelvic exam. Your health care provider may look for areas of increased hair growth on your skin.  Tests, such as: ? An ultrasound to check the ovaries for cysts and to view the lining of the uterus. ? Blood tests to check levels of sugar (glucose), female hormone (testosterone), and female hormones (estrogen and progesterone). How  is this treated? There is no cure for this condition, but treatment can help to manage symptoms and prevent more health problems from developing. Treatment varies depending on your symptoms and if you want to have a baby or if you need birth control. Treatment may include:  Making nutrition and lifestyle changes.  Taking the progesterone hormone to start a menstrual period.  Taking birth control pills to help you have regular menstrual periods.  Taking medicines such as: ? Medicines to make you ovulate, if you want to get pregnant. ? Medicine to reduce extra hair growth.  Having surgery in severe cases. This may involve making small holes in one or both of your ovaries. This decreases the amount of testosterone that your body makes. Follow these instructions at home:  Take over-the-counter and prescription medicines only as told by your health care provider.  Follow a healthy meal plan that includes lean proteins, complex carbohydrates, fresh fruits and vegetables, low-fat dairy products, healthy fats, and fiber.  If you are overweight, lose weight as told by your health care provider. Your health care provider can determine how much weight loss is best for you and can help you lose weight safely.  Keep all follow-up visits. This is important. Contact a health care provider if:  Your symptoms do not get better with medicine.  Your symptoms get worse or you develop new symptoms. Summary  Polycystic ovarian syndrome (PCOS) is a common hormonal disorder among women of reproductive age.  PCOS can cause problems with menstrual periods and make it hard   to get and stay pregnant.  If this condition is not treated, it can lead to serious health problems, such as diabetes and heart disease.  There is no cure for this condition, but treatment can help to manage symptoms and prevent more health problems from developing. This information is not intended to replace advice given to you by your  health care provider. Make sure you discuss any questions you have with your health care provider. Document Revised: 09/03/2019 Document Reviewed: 09/03/2019 Elsevier Patient Education  2021 [employer] Inc.  

## 2020-07-01 NOTE — Assessment & Plan Note (Addendum)
BMI 41.95.  Recommended eating smaller high protein, low fat meals more frequently and exercising 30 mins a day 5 times a week with a goal of 10-15lb weight loss in the next 3 months. Patient voiced their understanding and motivation to adhere to these recommendations.  She wishes labs today, will obtain CBC, CMP, TSH, lipid, and A1c.

## 2020-07-01 NOTE — Assessment & Plan Note (Signed)
Chronic, ongoing, no current medications.  She wishes labs today, will obtain CBC, CMP, TSH, lipid, and A1c.

## 2020-07-02 LAB — COMPREHENSIVE METABOLIC PANEL
ALT: 21 IU/L (ref 0–32)
AST: 14 IU/L (ref 0–40)
Albumin/Globulin Ratio: 1.8 (ref 1.2–2.2)
Albumin: 4.2 g/dL (ref 3.8–4.8)
Alkaline Phosphatase: 100 IU/L (ref 44–121)
BUN/Creatinine Ratio: 8 — ABNORMAL LOW (ref 9–23)
BUN: 6 mg/dL (ref 6–20)
Bilirubin Total: 0.5 mg/dL (ref 0.0–1.2)
CO2: 23 mmol/L (ref 20–29)
Calcium: 9 mg/dL (ref 8.7–10.2)
Chloride: 102 mmol/L (ref 96–106)
Creatinine, Ser: 0.8 mg/dL (ref 0.57–1.00)
Globulin, Total: 2.3 g/dL (ref 1.5–4.5)
Glucose: 92 mg/dL (ref 65–99)
Potassium: 4 mmol/L (ref 3.5–5.2)
Sodium: 140 mmol/L (ref 134–144)
Total Protein: 6.5 g/dL (ref 6.0–8.5)
eGFR: 98 mL/min/{1.73_m2} (ref >59)

## 2020-07-02 LAB — CBC WITH DIFFERENTIAL/PLATELET
Basophils Absolute: 0 10*3/uL (ref 0.0–0.2)
Basos: 0 %
EOS (ABSOLUTE): 0.1 10*3/uL (ref 0.0–0.4)
Eos: 2 %
Hematocrit: 39.2 % (ref 34.0–46.6)
Hemoglobin: 13.5 g/dL (ref 11.1–15.9)
Immature Grans (Abs): 0 10*3/uL (ref 0.0–0.1)
Immature Granulocytes: 0 %
Lymphocytes Absolute: 2.4 10*3/uL (ref 0.7–3.1)
Lymphs: 32 %
MCH: 31.3 pg (ref 26.6–33.0)
MCHC: 34.4 g/dL (ref 31.5–35.7)
MCV: 91 fL (ref 79–97)
Monocytes Absolute: 0.4 10*3/uL (ref 0.1–0.9)
Monocytes: 6 %
Neutrophils Absolute: 4.4 10*3/uL (ref 1.4–7.0)
Neutrophils: 60 %
Platelets: 387 10*3/uL (ref 150–450)
RBC: 4.32 x10E6/uL (ref 3.77–5.28)
RDW: 12.1 % (ref 11.7–15.4)
WBC: 7.4 10*3/uL (ref 3.4–10.8)

## 2020-07-02 LAB — HEMOGLOBIN A1C
Est. average glucose Bld gHb Est-mCnc: 100 mg/dL
Hgb A1c MFr Bld: 5.1 % (ref 4.8–5.6)

## 2020-07-02 LAB — LIPID PANEL W/O CHOL/HDL RATIO
Cholesterol, Total: 163 mg/dL (ref 100–199)
HDL: 43 mg/dL (ref >39)
LDL Chol Calc (NIH): 80 mg/dL (ref 0–99)
Triglycerides: 243 mg/dL — ABNORMAL HIGH (ref 0–149)
VLDL Cholesterol Cal: 40 mg/dL (ref 5–40)

## 2020-07-02 LAB — HIV ANTIBODY (ROUTINE TESTING W REFLEX): HIV Screen 4th Generation wRfx: NONREACTIVE

## 2020-07-02 LAB — TSH: TSH: 4.52 u[IU]/mL — ABNORMAL HIGH (ref 0.450–4.500)

## 2020-07-02 LAB — HEPATITIS C ANTIBODY: Hep C Virus Ab: <0.1 s/co ratio (ref 0.0–0.9)

## 2020-07-02 NOTE — Progress Notes (Signed)
Contacted via [place] afternoon Maylin, your labs have returned and overall they look pretty good.  Hep C and HIV are negative.  Electrolytes all normal.  Kidney function (creatinine and eGFR) + liver function (AST/ALT) are normal.  Cholesterol levels show some elevation in triglycerides, but LDL is 80 -- which is good.  Continue diet focus.  A1c, diabetes lab, is 5.1% not prediabetic or diabetic.  Great job!!  The only off lab is your TSH, thyroid lab, which is mildly elevated.  I would like you to schedule a lab visit only for 6 weeks to recheck this, if it trends up I would recommend starting medication, but we will see.  Often on recheck this will have trended to normal.  Any questions? Keep being awesome!!  Thank you for allowing me to participate in your care. Kindest regards, Buryl Bamber

## 2020-07-04 NOTE — Addendum Note (Signed)
Addended by: Aura Dials T on: 07/04/2020 12:27 PM   Modules accepted: Orders

## 2020-07-08 ENCOUNTER — Ambulatory Visit: Payer: Self-pay | Admitting: Surgery

## 2020-07-12 ENCOUNTER — Other Ambulatory Visit: Payer: Self-pay

## 2020-07-12 ENCOUNTER — Encounter: Payer: Self-pay | Admitting: Surgery

## 2020-07-12 ENCOUNTER — Encounter: Payer: Self-pay | Admitting: Nurse Practitioner

## 2020-07-12 ENCOUNTER — Telehealth: Payer: BC Managed Care – PPO | Admitting: Nurse Practitioner

## 2020-07-12 ENCOUNTER — Ambulatory Visit: Payer: BC Managed Care – PPO | Admitting: Surgery

## 2020-07-12 VITALS — BP 137/89 | HR 83 | Temp 97.8°F | Ht 61.0 in | Wt 227.4 lb

## 2020-07-12 DIAGNOSIS — L729 Follicular cyst of the skin and subcutaneous tissue, unspecified: Secondary | ICD-10-CM | POA: Diagnosis not present

## 2020-07-12 DIAGNOSIS — J3489 Other specified disorders of nose and nasal sinuses: Secondary | ICD-10-CM | POA: Insufficient documentation

## 2020-07-12 MED ORDER — HYDROCOD POLST-CPM POLST ER 10-8 MG/5ML PO SUER: 5.0000 mL | Freq: Two times a day (BID) | ORAL | 0 refills | Status: DC | PRN

## 2020-07-12 MED ORDER — FLUTICASONE PROPIONATE 50 MCG/ACT NA SUSP: 2.0000 | Freq: Every day | NASAL | 6 refills | Status: DC

## 2020-07-12 MED ORDER — PREDNISONE 20 MG PO TABS: 40.0000 mg | ORAL_TABLET | Freq: Every day | ORAL | 0 refills | Status: AC

## 2020-07-12 NOTE — Progress Notes (Signed)
Patient ID: [name], female   DOB: 1986/03/26, 35 y.o.   MRN: [id number]  Chief Complaint: Dermal cysts of scalp  History of Present Illness [name] is a 35 y.o. female with right-sided scalp cysts, 2 relatively adjacent to each other, 1 being present for multiple years, recently grown in size.  Reports tenderness, bumping it during combing hair grooming.  She denies drainage.  She has some concerns whether this is related to occasional headaches.  Past Medical History Past Medical History:  Diagnosis Date  . ADHD (attention deficit hyperactivity disorder)   . Allergy   . Anxiety   . Bipolar 2 disorder (HCC)   . Blood transfusion as the cause of abnormal reaction of patient, or of later complication, without misadventure at the time of procedure    lost to much blood during delivery  . Depression   . Interstitial cystitis   . Kidney stones   . PCOS (polycystic ovarian syndrome)   . Sleep apnea       Past Surgical History:  Procedure Laterality Date  . interstitial cystitis laparoscopy    . KIDNEY STONE SURGERY     basket, lithotripsy  . KNEE ARTHROSCOPY Right   . TONSILLECTOMY AND ADENOIDECTOMY    . TUBAL LIGATION      Allergies  Allergen Reactions  . Latex Anaphylaxis  . Penicillins Nausea And Vomiting    Current Outpatient Medications  Medication Sig Dispense Refill  . phentermine (ADIPEX-P) 37.5 MG tablet Take 37.5 mg by mouth daily before breakfast.      No current facility-administered medications for this visit.    Family History Family History  Problem Relation Age of Onset  . Hyperlipidemia Mother   . Hypertension Mother   . Hypertension Father   . Hyperlipidemia Father   . Hyperlipidemia Sister   . Hyperlipidemia Maternal Grandfather   . Hypertension Maternal Grandfather   . Hypertension Paternal Grandmother   . Hyperlipidemia Paternal Grandmother   . Hyperlipidemia Paternal Grandfather   . Hypertension Paternal Grandfather        Social History Social History   Tobacco Use  . Smoking status: Never Smoker  . Smokeless tobacco: Never Used  Vaping Use  . Vaping Use: Never used  Substance Use Topics  . Alcohol use: Yes    Comment: on occasion  . Drug use: No        Review of Systems  Constitutional: Negative.   HENT: Negative.   Eyes: Negative.   Respiratory: Negative.   Cardiovascular: Negative.   Gastrointestinal: Negative.   Genitourinary: Negative.   Skin: Negative.   Neurological: Positive for headaches.  Psychiatric/Behavioral: Negative.       Physical Exam Blood pressure 137/89, pulse 83, temperature 97.8 F (36.6 C), temperature source Oral, height 5\' 1"  (1.549 m), weight 227 lb 6.4 oz (103.1 kg), last menstrual period 06/28/2020, SpO2 98 %. Last Weight  Most recent update: 07/12/2020 11:40 AM   Weight  103.1 kg (227 lb 6.4 oz)            CONSTITUTIONAL: Well developed, and nourished, appropriately responsive and aware without distress.   EYES: Sclera non-icteric.   EARS, NOSE, MOUTH AND THROAT: Mask worn.    Hearing is intact to voice.  NECK: Trachea is midline, and there is no jugular venous distension.  LYMPH NODES:  Lymph nodes in the neck are not enlarged. MUSCULOSKELETAL:  Symmetrical muscle tone appreciated in all four extremities.    SKIN: Skin turgor is normal.  No pathologic skin lesions appreciated.  Fairly close to the crown of the head are 2 cysts to the right of midline about the parietal region, 1 roughly 1.5 cm and the other, just under 1 cm.  Neither appear inflamed, there appears to be a slight crust on the larger.  Relatively mobile from the underlying soft tissues. NEUROLOGIC:  Motor and sensation appear grossly normal.  Cranial nerves are grossly without defect. PSYCH:  Alert and oriented to person, place and time. Affect is appropriate for situation.  Data Reviewed I have personally reviewed what is currently available of the patient's imaging, recent labs  and medical records.   Labs:  CBC Latest Ref Rng & Units 07/01/2020 09/01/2018 08/31/2015  WBC 3.4 - 10.8 x10E3/uL 7.4 11.3(H) 8.1  Hemoglobin 11.1 - 15.9 g/dL 38.1 01.7 51.0  Hematocrit 34.0 - 46.6 % 39.2 43.5 38.5  Platelets 150 - 450 x10E3/uL 387 352 335   CMP Latest Ref Rng & Units 07/01/2020 09/01/2018 03/05/2016  Glucose 65 - 99 mg/dL 92 258(N) 72  BUN 6 - 20 mg/dL 6 10 7   Creatinine 0.57 - 1.00 mg/dL 2.77 8.24  Sodium 134 - 144 mmol/L 140 137 140  Potassium 3.5 - 5.2 mmol/L 4.0 4.0 4.6  Chloride 96 - 106 mmol/L 102 101 100  CO2 20 - 29 mmol/L 23 28 24   Calcium 8.7 - 10.2 mg/dL 9.0 9.6 9.5  Total Protein 6.0 - 8.5 g/dL 6.5 7.5 6.9  Total Bilirubin 0.0 - 1.2 mg/dL 0.5 0.4 0.3  Alkaline Phos 44 - 121 IU/L 100 75 97  AST 0 - 40 IU/L 14 15 12   ALT 0 - 32 IU/L 21 15 17       Imaging:  Within last 24 hrs: No results found.  Assessment    Dermal cyst of scalp. Patient Active Problem List   Diagnosis Date Noted  . Obesity 07/01/2020  . Scalp cyst 07/01/2020  . PCOS (polycystic ovarian syndrome) 01/07/2015  . Hirsutism 12/15/2014  . Cervical disc disorder with radiculopathy of cervical region 12/15/2014  . ANXIETY DISORDER 06/26/2007  . HERPES LABIALIS 12/02/2006  . NEPHROLITHIASIS 11/21/2006  . CYSTITIS, CHRONIC INTERSTITIAL 11/21/2006    Plan    Offered excision today under local anesthetic, patient was not mentally prepared to proceed.  Had concerns about how it may interfere with her activities and desires to defer till next week.  Procedure discussed.  Face-to-face time spent with the patient and accompanying care providers(if present) was 20 minutes, with more than 50% of the time spent counseling, educating, and coordinating care of the patient.      06/28/2007 M.D., FACS 07/12/2020, 12:00 PM

## 2020-07-12 NOTE — Patient Instructions (Signed)

## 2020-07-12 NOTE — Patient Instructions (Addendum)
We will perform and excision of the scalp cysts. See follow up appointment below. If you have any concerns or questions, please feel free to call our office.

## 2020-07-12 NOTE — Assessment & Plan Note (Addendum)
Acute x 4 days, negative Covid testing.  Discussed with her at this time will not provide abx as suspect more viral and allergy related, if no improvement by Friday (7 days) to alert provider and will send this in.  Discussed abx stewardship with her.  At this time treat with Prednisone 40 MG daily x 5 days, Flonase, and Tussionex sent in for symptom relief at HS.  Recommend she continue daily antihistamine and at home treatment.  Recommend: - Increased rest - Increasing Fluids - Acetaminophen / ibuprofen as needed for fever/pain.  - Saline sinus flushes or a neti pot.  - Humidifying the air Return for worsening or ongoing symptoms.

## 2020-07-12 NOTE — Progress Notes (Signed)
LMP 06/28/2020    Subjective:    Patient ID: [name], female    DOB: [birth year], 35 y.o.   MRN: [id number]  HPI: [name] is a 35 y.o. female  Chief Complaint  Patient presents with  . Sinus Problem    Patient states she thinks it is allergy related, where she has pressure around her forehead and nasal area. Patient states this time around she noticed her glands in her throat was swollen back in her glands, denies any sore throat and when she touches the area it is tender. Patient states she now has discoloration to her mucus drainage. Patient states she is taking OTC medications and nothing seems to help.  . Chills    Patient took at home test and results came back negative. Symptoms started on Thursday or Friday.   . Sinusitis  . Follow-up    Patient wanted to make Dr.Gregrey Bloyd aware that she is scheduled for a procedure to take care of her scalp cyst.     . This visit was completed via MyChart due to the restrictions of the COVID-19 pandemic. All issues as above were discussed and addressed. Physical exam was done as above through visual confirmation on MyChart. If it was felt that the patient should be evaluated in the office, they were directed there. The patient verbally consented to this visit. . Location of the patient: home . Location of the provider: work . Those involved with this call:  . Provider: Marnee Guarneri, DNP . CMA: Irena Reichmann, CMA . Front Desk/Registration: Roe Rutherford  . Time spent on call: 21 minutes with patient face to face via video conference. More than 50% of this time was spent in counseling and coordination of care. 15 minutes total spent in review of patient's record and preparation of their chart.  . I verified patient identity using two factors (patient name and date of birth). Patient consents verbally to being seen via telemedicine visit today.    UPPER RESPIRATORY TRACT INFECTION Symptoms started on Friday or Saturday,  started coughing up green phlegm.  Had a little chills yesterday and noticed swollen glands on neck.  Covid home test was negative, this morning.  Is not vaccinated.  Has fall allergies at baseline, but this year has been bothering her during spring -- is taking Claritin daily.   Fever: has had chills Cough: not as much now Shortness of breath: no Wheezing: no Chest pain: no Chest tightness: no Chest congestion: no Nasal congestion: yes Runny nose: no Post nasal drip: yes Sneezing: no Sore throat: no Swollen glands: no Sinus pressure: yes in frontal and maxillary area Headache: no Face pain: no Toothache: no Ear pain: none Ear pressure: none Eyes red/itching:no Eye drainage/crusting: no  Vomiting: no Rash: no Fatigue: yes Sick contacts: no Strep contacts: no  Context: stable Recurrent sinusitis: no Relief with OTC cold/cough medications: yes  Treatments attempted: cold/sinus and anti-histamine , Tessalon  Relevant past medical, surgical, family and social history reviewed and updated as indicated. Interim medical history since our last visit reviewed. Allergies and medications reviewed and updated.  Review of Systems  Constitutional: Positive for fatigue and fever. Negative for activity change and appetite change.  HENT: Positive for congestion and sinus pressure. Negative for ear discharge, ear pain, facial swelling, postnasal drip, rhinorrhea, sinus pain, sneezing, sore throat and voice change.   Eyes: Negative for pain and visual disturbance.  Respiratory: Negative for cough, chest tightness, shortness of breath and wheezing.  Cardiovascular: Negative for chest pain, palpitations and leg swelling.  Gastrointestinal: Negative for abdominal distention, abdominal pain, constipation, diarrhea, nausea and vomiting.  Endocrine: Negative.   Musculoskeletal: Negative for myalgias.  Neurological: Positive for headaches. Negative for dizziness and numbness.   Psychiatric/Behavioral: Negative.     Per HPI unless specifically indicated above     Objective:    LMP 06/28/2020   Wt Readings from Last 3 Encounters:  07/12/20 227 lb 6.4 oz (103.1 kg)  07/01/20 236 lb 12.8 oz (107.4 kg)  11/27/18 218 lb (98.9 kg)    Physical Exam Vitals and nursing note reviewed.  Constitutional:      General: She is awake. She is not in acute distress.    Appearance: She is well-developed and well-groomed. She is obese. She is not ill-appearing or toxic-appearing.  HENT:     Head: Normocephalic.     Right Ear: Hearing normal.     Left Ear: Hearing normal.  Eyes:     General: Lids are normal.        Right eye: No discharge.        Left eye: No discharge.     Conjunctiva/sclera: Conjunctivae normal.  Pulmonary:     Effort: Pulmonary effort is normal. No accessory muscle usage or respiratory distress.  Musculoskeletal:     Cervical back: Normal range of motion.  Neurological:     Mental Status: She is alert and oriented to person, place, and time.  Psychiatric:        Attention and Perception: Attention normal.        Mood and Affect: Mood normal.        Behavior: Behavior normal. Behavior is cooperative.        Thought Content: Thought content normal.        Judgment: Judgment normal.     Results for orders placed or performed in visit on 07/01/20  Comprehensive metabolic panel  Result Value Ref Range   Glucose 92 65 - 99 mg/dL   BUN 6 6 - 20 mg/dL   Creatinine, Ser 0.80 0.57 - 1.00 mg/dL   eGFR 98 >59 mL/min/1.73   BUN/Creatinine Ratio 8 (L) 9 - 23   Sodium 140 134 - 144 mmol/L   Potassium 4.0 3.5 - 5.2 mmol/L   Chloride 102 96 - 106 mmol/L   CO2 23 20 - 29 mmol/L   Calcium 9.0 8.7 - 10.2 mg/dL   Total Protein 6.5 6.0 - 8.5 g/dL   Albumin 4.2 3.8 - 4.8 g/dL   Globulin, Total 2.3 1.5 - 4.5 g/dL   Albumin/Globulin Ratio 1.8 1.2 - 2.2   Bilirubin Total 0.5 0.0 - 1.2 mg/dL   Alkaline Phosphatase 100 44 - 121 IU/L   AST 14 0 - 40 IU/L    ALT 21 0 - 32 IU/L  Lipid Panel w/o Chol/HDL Ratio  Result Value Ref Range   Cholesterol, Total 163 100 - 199 mg/dL   Triglycerides 243 (H) 0 - 149 mg/dL   HDL 43 >39 mg/dL   VLDL Cholesterol Cal 40 5 - 40 mg/dL   LDL Chol Calc (NIH) 80 0 - 99 mg/dL  TSH  Result Value Ref Range   TSH 4.520 (H) 0.450 - 4.500 uIU/mL  CBC with Differential/Platelet  Result Value Ref Range   WBC 7.4 3.4 - 10.8 x10E3/uL   RBC 4.32 3.77 - 5.28 x10E6/uL   Hemoglobin 13.5 11.1 - 15.9 g/dL   Hematocrit 39.2 34.0 - 46.6 %   MCV  91 79 - 97 fL   MCH 31.3 26.6 - 33.0 pg   MCHC 34.4 31.5 - 35.7 g/dL   RDW 12.1 11.7 - 15.4 %   Platelets 387 150 - 450 x10E3/uL   Neutrophils 60 Not Estab. %   Lymphs 32 Not Estab. %   Monocytes 6 Not Estab. %   Eos 2 Not Estab. %   Basos 0 Not Estab. %   Neutrophils Absolute 4.4 1.4 - 7.0 x10E3/uL   Lymphocytes Absolute 2.4 0.7 - 3.1 x10E3/uL   Monocytes Absolute 0.4 0.1 - 0.9 x10E3/uL   EOS (ABSOLUTE) 0.1 0.0 - 0.4 x10E3/uL   Basophils Absolute 0.0 0.0 - 0.2 x10E3/uL   Immature Granulocytes 0 Not Estab. %   Immature Grans (Abs) 0.0 0.0 - 0.1 x10E3/uL  Hemoglobin A1c  Result Value Ref Range   Hgb A1c MFr Bld 5.1 4.8 - 5.6 %   Est. average glucose Bld gHb Est-mCnc 100 mg/dL  Hepatitis C antibody  Result Value Ref Range   Hep C Virus Ab <0.1 0.0 - 0.9 s/co ratio  HIV Antibody (routine testing w rflx)  Result Value Ref Range   HIV Screen 4th Generation wRfx Non Reactive Non Reactive      Assessment & Plan:   Problem List Items Addressed This Visit      Other   Sinus pressure - Primary    Acute x 4 days, negative Covid testing.  Discussed with her at this time will not provide abx as suspect more viral and allergy related, if no improvement by Friday (7 days) to alert provider and will send this in.  Discussed abx stewardship with her.  At this time treat with Prednisone 40 MG daily x 5 days, Flonase, and Tussionex sent in for symptom relief at HS.  Recommend she  continue daily antihistamine and at home treatment.  Recommend: - Increased rest - Increasing Fluids - Acetaminophen / ibuprofen as needed for fever/pain.  - Saline sinus flushes or a neti pot.  - Humidifying the air Return for worsening or ongoing symptoms.      Relevant Medications   predniSONE (DELTASONE) 20 MG tablet   fluticasone (FLONASE) 50 MCG/ACT nasal spray   chlorpheniramine-HYDROcodone (TUSSIONEX PENNKINETIC ER) 10-8 MG/5ML SUER      I discussed the assessment and treatment plan with the patient. The patient was provided an opportunity to ask questions and all were answered. The patient agreed with the plan and demonstrated an understanding of the instructions.   The patient was advised to call back or seek an in-person evaluation if the symptoms worsen or if the condition fails to improve as anticipated.   I provided 21+ minutes of time during this encounter.  Follow up plan: No follow-ups on file.

## 2020-07-15 ENCOUNTER — Other Ambulatory Visit: Payer: Self-pay | Admitting: Nurse Practitioner

## 2020-07-15 MED ORDER — AZITHROMYCIN 250 MG PO TABS: ORAL_TABLET | ORAL | 0 refills | Status: DC

## 2020-07-15 NOTE — Progress Notes (Signed)
Ongoing sinus symptoms with recent URI visit, symptoms now ongoing >7 days will send in abx.

## 2020-07-19 ENCOUNTER — Ambulatory Visit: Payer: Managed Care, Other (non HMO) | Admitting: Surgery

## 2020-07-21 ENCOUNTER — Telehealth: Payer: Self-pay | Admitting: Surgery

## 2020-07-21 NOTE — Telephone Encounter (Signed)
LVM for pt to call the clinic

## 2020-07-21 NOTE — Telephone Encounter (Signed)
Patient is calling and just has some questions about an upcoming appt, if there is anything she needs to do for that appointment. Please call patient and advise.

## 2020-07-25 NOTE — Telephone Encounter (Signed)
Pt called back and I let pt know there is nothing she needs to do before her procedure with Dr. Claudine Mouton.

## 2020-07-28 ENCOUNTER — Encounter: Payer: Self-pay | Admitting: Surgery

## 2020-07-28 ENCOUNTER — Ambulatory Visit: Payer: BC Managed Care – PPO | Admitting: Surgery

## 2020-07-28 ENCOUNTER — Other Ambulatory Visit: Payer: Self-pay

## 2020-07-28 VITALS — BP 123/74 | HR 68 | Temp 98.4°F | Ht 60.0 in | Wt 229.6 lb

## 2020-07-28 DIAGNOSIS — L729 Follicular cyst of the skin and subcutaneous tissue, unspecified: Secondary | ICD-10-CM

## 2020-07-28 NOTE — Patient Instructions (Addendum)
You may let clear water run over the area but do not use shampoo or soaps.   We have removed a Cyst in our office today.  You have sutures under the skin that will dissolve and also dermabond (skin glue) on top of your skin which will come off on it's own in 10-14 days.  You may shower in 48 hours, this is on 07/30/20.   Avoid Strenuous activities that will make you sweat during the next 48 hours to avoid the glue coming off prematurely. Avoid activities that will place pressure to this area of the body for 1-2 weeks to avoid re-injury to incision site.  Please see your follow-up appointment provided. We will see you back in office to make sure this area is healed and to review the final pathology. If you have any questions or concerns prior to this appointment, call our office and speak with a nurse.    Excision of Skin Cysts or Lesions Excision of a skin lesion refers to the removal of a section of skin by making small cuts (incisions) in the skin. This procedure may be done to remove a cancerous (malignant) or noncancerous (benign) growth on the skin. It is typically done to treat or prevent cancer or infection. It may also be done to improve cosmetic appearance. The procedure may be done to remove:  Cancerous growths, such as basal cell carcinoma, squamous cell carcinoma, or melanoma.  Noncancerous growths, such as a cyst or lipoma.  Growths, such as moles or skin tags, which may be removed for cosmetic reasons.  Various excision or surgical techniques may be used depending on your condition, the location of the lesion, and your overall health. Tell a health care provider about:  Any allergies you have.  All medicines you are taking, including vitamins, herbs, eye drops, creams, and over-the-counter medicines.  Any problems you or family members have had with anesthetic medicines.  Any blood disorders you have.  Any surgeries you have had.  Any medical conditions you  have.  Whether you are pregnant or may be pregnant. What are the risks? Generally, this is a safe procedure. However, problems may occur, including:  Bleeding.  Infection.  Scarring.  Recurrence of the cyst, lipoma, or cancer.  Changes in skin sensation or appearance, such as discoloration or swelling.  Reaction to the anesthetics.  Allergic reaction to surgical materials or ointments.  Damage to nerves, blood vessels, muscles, or other structures.  Continued pain.  What happens before the procedure?  Ask your health care provider about: ? Changing or stopping your regular medicines. This is especially important if you are taking diabetes medicines or blood thinners. ? Taking medicines such as aspirin and ibuprofen. These medicines can thin your blood. Do not take these medicines before your procedure if your health care provider instructs you not to.  You may be asked to take certain medicines.  You may be asked to stop smoking.  You may have an exam or testing.  Plan to have someone take you home after the procedure.  Plan to have someone help you with activities during recovery. What happens during the procedure?  To reduce your risk of infection: ? Your health care team will wash or sanitize their hands. ? Your skin will be washed with soap.  You will be given a medicine to numb the area (local anesthetic).  One of the following excision techniques will be performed.  At the end of any of these procedures, antibiotic ointment  will be applied as needed. Each of the following techniques may vary among health care providers and hospitals. Complete Surgical Excision The area of skin that needs to be removed will be marked with a pen. Using a small scalpel or scissors, the surgeon will gently cut around and under the lesion until it is completely removed. The lesion will be placed in a fluid and sent to the lab for examination. If necessary, bleeding will be  controlled with a device that delivers heat (electrocautery). The edges of the wound may be stitched (sutured) together, and a bandage (dressing) will be applied. This procedure may be performed to treat a cancerous growth or a noncancerous cyst or lesion. Excision of a Cyst The surgeon will make an incision on the cyst. The entire cyst will be removed through the incision. The incision may be closed with sutures. Shave Excision During shave excision, the surgeon will use a small blade or an electrically heated loop instrument to shave off the lesion. This may be done to remove a mole or a skin tag. The wound will usually be left to heal on its own without sutures. Punch Excision During punch excision, the surgeon will use a small tool that is like a cookie cutter or a hole punch to cut a circle shape out of the skin. The outer edges of the skin will be sutured together. This may be done to remove a mole or a scar or to perform a biopsy of the lesion. Mohs Micrographic Surgery During Mohs micrographic surgery, layers of the lesion will be removed with a scalpel or a loop instrument and will be examined right away under a microscope. Layers will be removed until all of the abnormal or cancerous tissue has been removed. This procedure is minimally invasive, and it ensures the best cosmetic outcome. It involves the removal of as little normal tissue as possible. Mohs is usually done to treat skin cancer, such as basal cell carcinoma or squamous cell carcinoma, particularly on the face and ears. Depending on the size of the surgical wound, it may be sutured closed. What happens after the procedure?  Return to your normal activities as told by your health care provider.  Talk with your health care provider to discuss any test results, treatment options, and if necessary, the need for more tests. This information is not intended to replace advice given to you by your health care provider. Make sure you  discuss any questions you have with your health care provider. Document Released: 06/20/2009 Document Revised: 09/01/2015 Document Reviewed: 05/12/2014 Elsevier Interactive Patient Education  [employer].

## 2020-07-28 NOTE — Progress Notes (Signed)
Excision 1 cm scalp cyst.  Pre-operative Diagnosis: Scalp cyst.  1.3 cm, symptomatic.  Post-operative Diagnosis: same.    Surgeon: Campbell Lerner, M.D., FACS  Anesthesia: Local.  Findings: Well-defined dermal cyst with capsule completely removed.  Estimated Blood Loss: 2 mL         Specimens: As above, discarded.          Complications: none              Procedure Detail  The patient was positioned in the sitting position and the right apical scalp was prepped with  Chloraprep and draped in the sterile fashion.  A Time Out was held and the above information confirmed. Local infiltration of the cyst was completed with 1% lidocaine with epi.  Elliptical incision was made excising the overlying skin, this freed up the anterior aspect of the cyst which was readily shelled out from the surrounding soft tissues.  This again was then closed with 2 interrupted simple sutures of 3-0 nylon. Triple antibiotic applied.  Procedure was well-tolerated.  She can return in 1 week for suture removal.  She may shower tomorrow.  Advised to avoid shampoos and other chemicals to the scalp for the present time.       Campbell Lerner M.D., [facility] 07/28/2020 2:44 PM

## 2020-08-02 ENCOUNTER — Telehealth: Payer: Self-pay

## 2020-08-02 ENCOUNTER — Other Ambulatory Visit: Payer: Self-pay | Admitting: Nurse Practitioner

## 2020-08-02 DIAGNOSIS — G4733 Obstructive sleep apnea (adult) (pediatric): Secondary | ICD-10-CM

## 2020-08-02 NOTE — Telephone Encounter (Signed)
Pt stated she would like referral to sleep op.

## 2020-08-02 NOTE — Telephone Encounter (Signed)
Referral in

## 2020-08-03 NOTE — Telephone Encounter (Signed)
Lvm that referral has been sent

## 2020-08-04 ENCOUNTER — Ambulatory Visit (INDEPENDENT_AMBULATORY_CARE_PROVIDER_SITE_OTHER): Payer: BC Managed Care – PPO | Admitting: Surgery

## 2020-08-04 ENCOUNTER — Other Ambulatory Visit: Payer: Self-pay

## 2020-08-04 ENCOUNTER — Encounter: Payer: Self-pay | Admitting: Surgery

## 2020-08-04 VITALS — BP 120/75 | HR 71 | Temp 98.4°F | Ht 61.0 in | Wt 232.2 lb

## 2020-08-04 DIAGNOSIS — L729 Follicular cyst of the skin and subcutaneous tissue, unspecified: Secondary | ICD-10-CM

## 2020-08-04 NOTE — Patient Instructions (Signed)
Please call the office if you have any questions or concerns. 

## 2020-08-04 NOTE — Progress Notes (Signed)
Here for follow-up after excision of scalp cyst.  No complaints.  Ready to get her sutures removed because they are catching in her hairbrush.  Sutures removed.  Incision is intact, no induration.  Dry and clean. May follow-up as needed.

## 2020-08-26 IMAGING — US US RENAL
1 series · 14 of 25 positions shown · non-contrast
Comparison: CT abdomen pelvis 10/08/2018

CLINICAL DATA: BILATERAL nephrolithiasis, LEFT flank pain for
months

EXAM:
RENAL / URINARY TRACT ULTRASOUND COMPLETE

[Series 1: us renal · 14 of 29 slices shown]
[im 1/29]
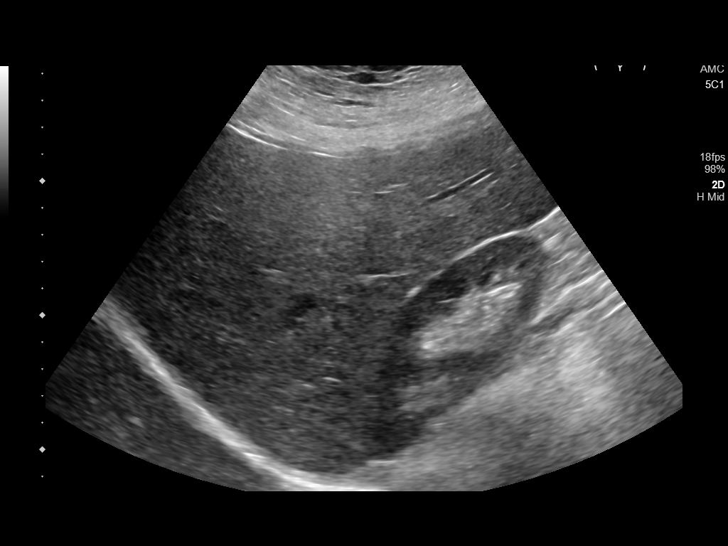
[im 3/29]
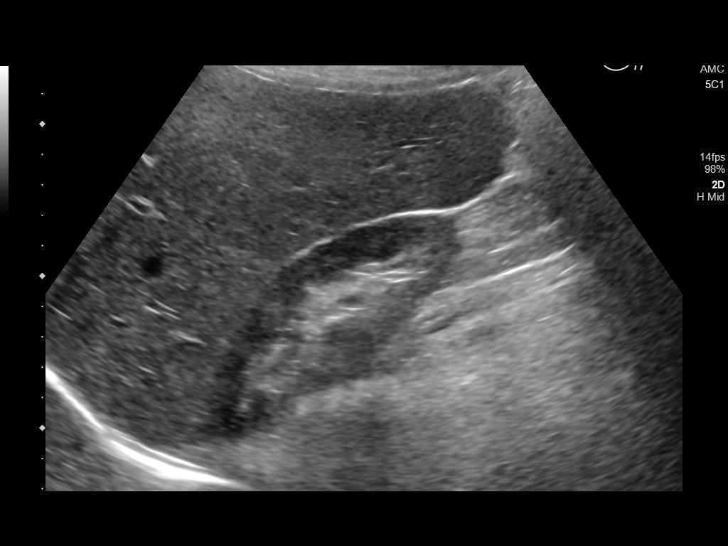
[im 5/29]
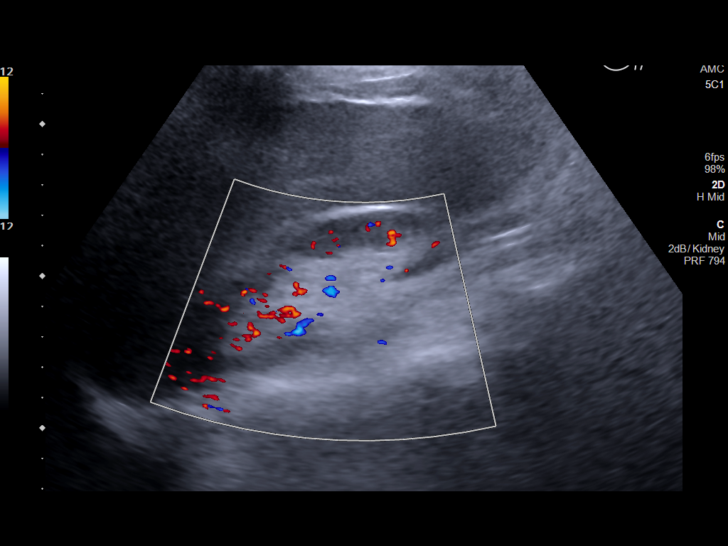
[im 8/29]
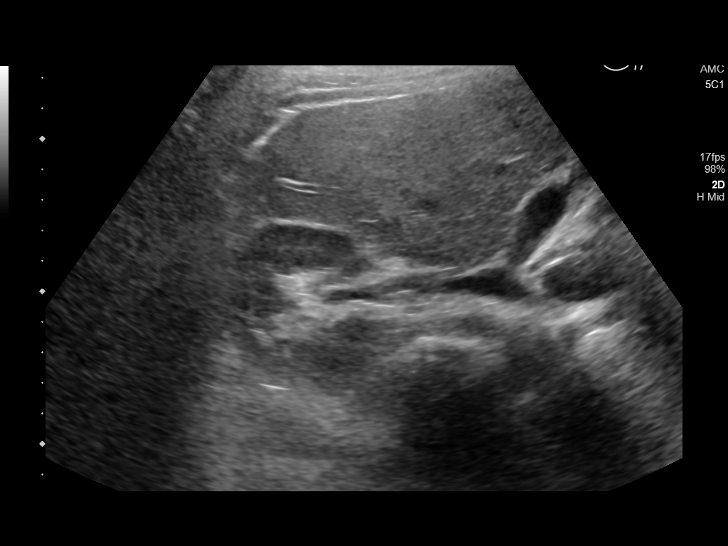
[im 10/29]
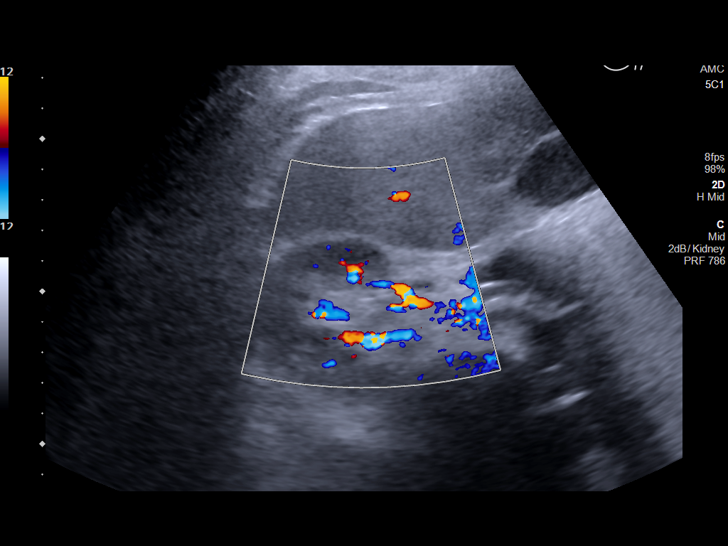
[im 11/29]
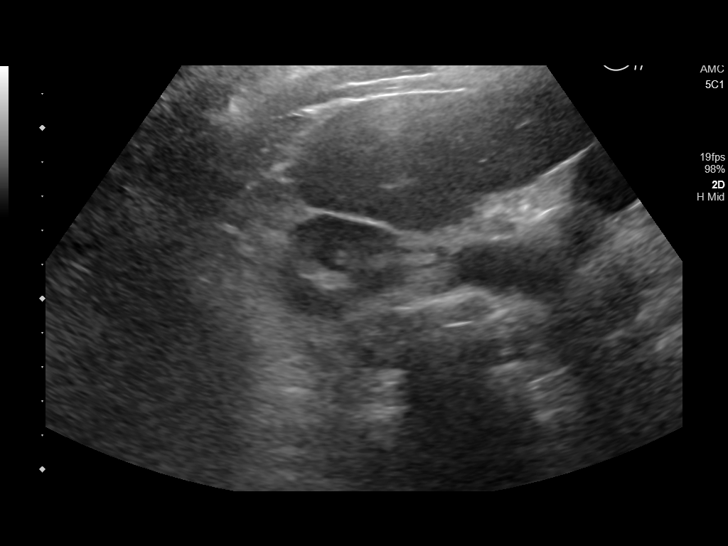
[im 13/29]
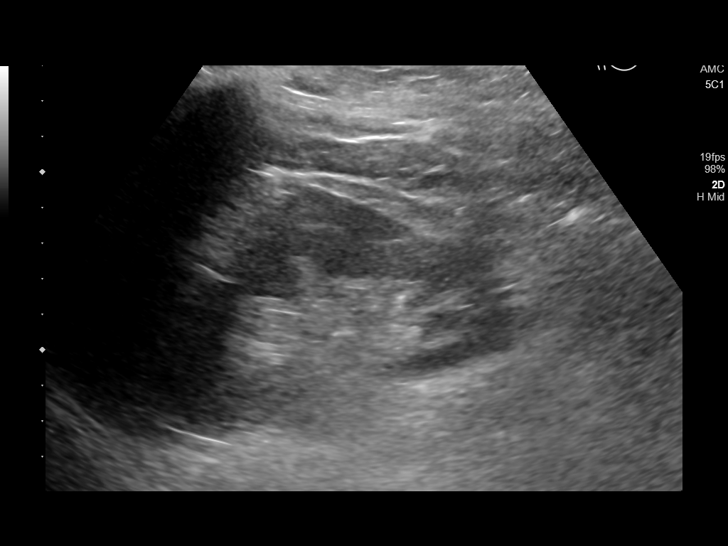
[im 16/29]
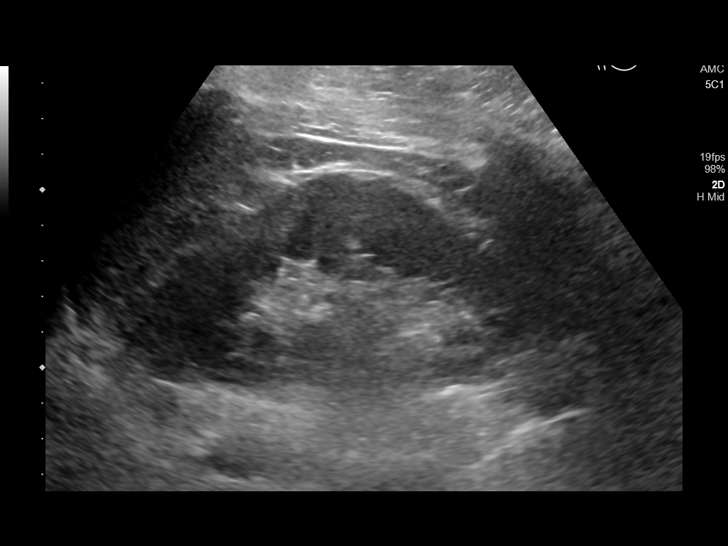
[im 18/29]
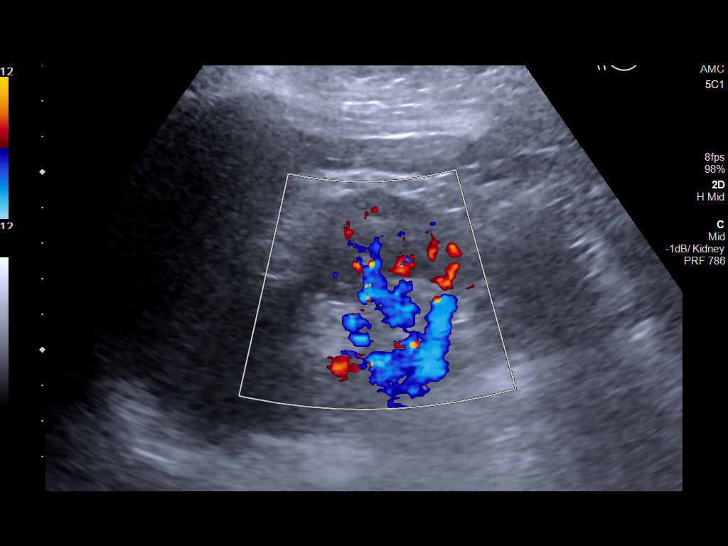
[im 19/29]
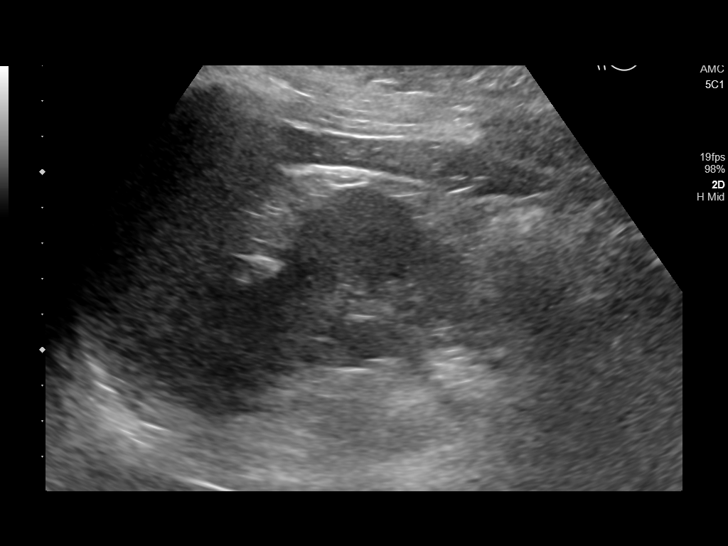
[im 22/29]
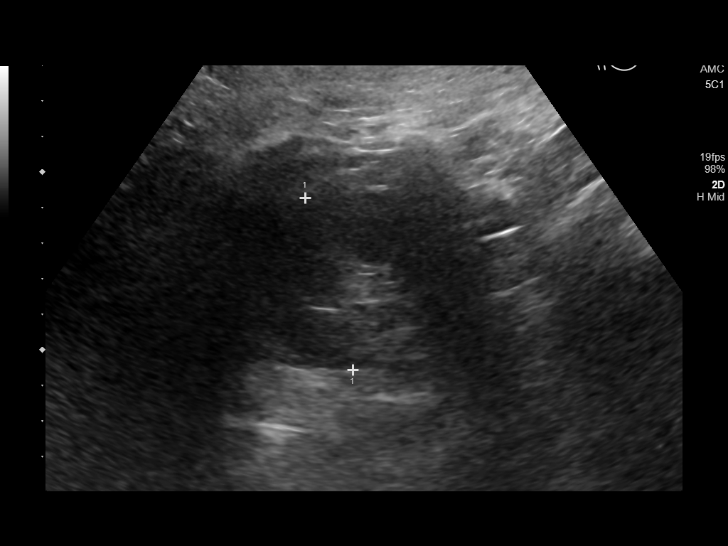
[im 24/29]
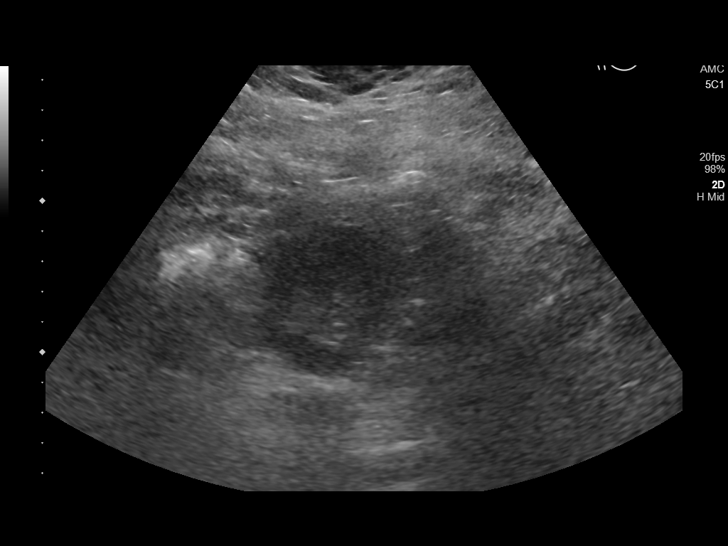
[im 26/29]
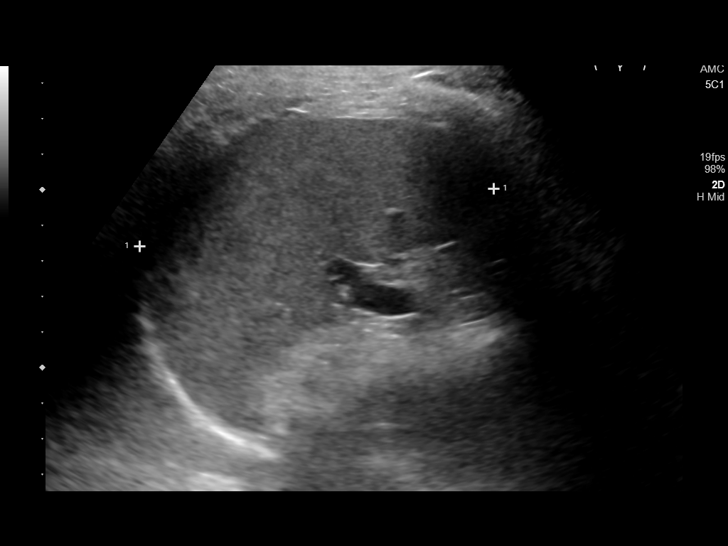
[im 29/29]
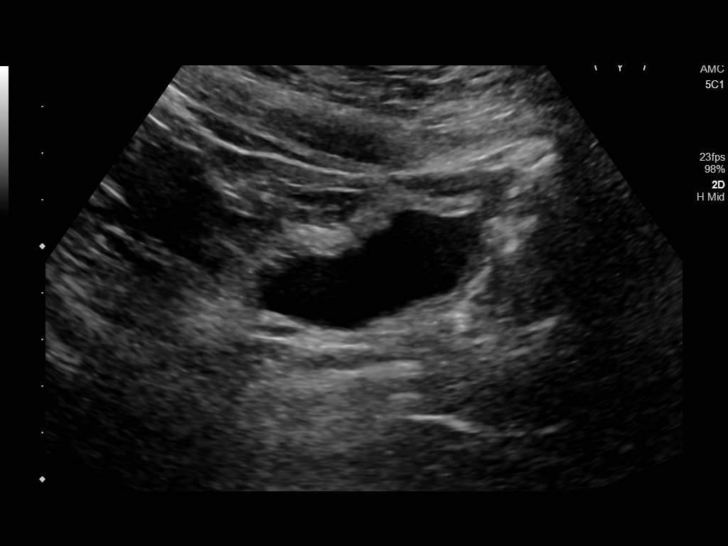

[14 of 25 positions shown; findings below may reference images not displayed]

FINDINGS: Right Kidney:

Renal measurements: 10.6 x 4.1 x 4.9 cm = volume: 110 mL. Cortical
thinning. Normal cortical echogenicity. No mass, hydronephrosis or
shadowing calcification.

Left Kidney:

Renal measurements: 10.0 x 4.9 x 5.0 cm. = volume: 128 mL. Normal
cortical thickness and echogenicity. No mass, hydronephrosis, or
shadowing calcification.

Bladder:

Appears normal for degree of bladder distention.
IMPRESSION: Cortical thinning of RIGHT kidney.

Otherwise negative exam.

Specifically, no evidence of LEFT hydronephrosis.

## 2020-08-30 ENCOUNTER — Other Ambulatory Visit: Payer: Self-pay

## 2020-08-30 ENCOUNTER — Encounter: Payer: Self-pay | Admitting: Nurse Practitioner

## 2020-08-30 ENCOUNTER — Ambulatory Visit: Payer: BC Managed Care – PPO | Admitting: Nurse Practitioner

## 2020-08-30 VITALS — BP 119/78 | HR 64 | Temp 98.1°F | Wt 233.8 lb

## 2020-08-30 DIAGNOSIS — R7989 Other specified abnormal findings of blood chemistry: Secondary | ICD-10-CM | POA: Diagnosis not present

## 2020-08-30 DIAGNOSIS — R079 Chest pain, unspecified: Secondary | ICD-10-CM | POA: Insufficient documentation

## 2020-08-30 MED ORDER — CYCLOBENZAPRINE HCL 10 MG PO TABS: 10.0000 mg | ORAL_TABLET | Freq: Three times a day (TID) | ORAL | 0 refills | Status: DC | PRN

## 2020-08-30 NOTE — Progress Notes (Signed)
Acute Office Visit  Subjective:    Patient ID: [name], female    DOB: [birth year], 35 y.o.   MRN: [id number]  Chief Complaint  Patient presents with  . Shoulder Pain    Patient states for about 4 days she has been having left shoulder, and neck pain. No known injury. Patient states she is able to move arm around with no issue.     HPI Patient is in today for chest pain that radiates to her neck and left shoulder  CHEST PAIN  Time since onset: Duration:days since Saturday Onset: gradual Quality: pressure-like Severity: 4/10 Location: upper left Radiation: neck and left shoulder Episode duration: constant, then sharp pains that come and go that radiate Frequency: a few times a hour Related to exertion: no Activity when pain started: in the morning when she first noticed it Trauma: no Anxiety/recent stressors: yes Aggravating factors: unsure Alleviating factors: unsure Status: worse Treatments attempted: antacids  Current pain status: chest wall tender Shortness of breath: no Cough: no Nausea: yes Diaphoresis: no Heartburn: no Palpitations: no  Past Medical History:  Diagnosis Date  . ADHD (attention deficit hyperactivity disorder)   . Allergy   . Anxiety   . Bipolar 2 disorder ([place])   . Blood transfusion as the cause of abnormal reaction of patient, or of later complication, without misadventure at the time of procedure    lost to much blood during delivery  . Depression   . Interstitial cystitis   . Kidney stones   . PCOS (polycystic ovarian syndrome)   . Sleep apnea     Past Surgical History:  Procedure Laterality Date  . interstitial cystitis laparoscopy    . KIDNEY STONE SURGERY     basket, lithotripsy  . KNEE ARTHROSCOPY Right   . TONSILLECTOMY AND ADENOIDECTOMY    . TUBAL LIGATION      Family History  Problem Relation Age of Onset  . Hyperlipidemia Mother   . Hypertension Mother   . Hypertension Father   . Hyperlipidemia Father    . Hyperlipidemia Sister   . Hyperlipidemia Maternal Grandfather   . Hypertension Maternal Grandfather   . Hypertension Paternal Grandmother   . Hyperlipidemia Paternal Grandmother   . Hyperlipidemia Paternal Grandfather   . Hypertension Paternal Grandfather     Social History   Socioeconomic History  . Marital status: Married    Spouse name: Not on file  . Number of children: Not on file  . Years of education: Not on file  . Highest education level: Not on file  Occupational History  . Not on file  Tobacco Use  . Smoking status: Never Smoker  . Smokeless tobacco: Never Used  Vaping Use  . Vaping Use: Never used  Substance and Sexual Activity  . Alcohol use: Yes    Comment: on occasion  . Drug use: No  . Sexual activity: Yes    Birth control/protection: Surgical  Other Topics Concern  . Not on file  Social History Narrative  . Not on file   Social Determinants of Health   Financial Resource Strain: Not on file  Food Insecurity: Not on file  Transportation Needs: Not on file  Physical Activity: Not on file  Stress: Not on file  Social Connections: Not on file  Intimate Partner Violence: Not on file    Outpatient Medications Prior to Visit  Medication Sig Dispense Refill  . fluticasone (FLONASE) 50 MCG/ACT nasal spray Place 2 sprays into both nostrils daily. [place]  g 6  . phentermine (ADIPEX-P) 37.5 MG tablet Take 37.5 mg by mouth daily before breakfast.     . chlorpheniramine-HYDROcodone (TUSSIONEX PENNKINETIC ER) 10-8 MG/5ML SUER Take 5 mLs by mouth every 12 (twelve) hours as needed for cough. (Patient not taking: Reported on 08/30/2020) 60 mL 0   No facility-administered medications prior to visit.    Allergies  Allergen Reactions  . Latex Anaphylaxis  . Penicillins Nausea And Vomiting    Review of Systems  Constitutional: Positive for fatigue. Negative for fever.  HENT: Negative.   Respiratory: Negative.   Cardiovascular: Positive for chest pain (see  hpi).  Gastrointestinal: Negative.   Genitourinary: Negative.   Musculoskeletal:       Chest pain that radiates to neck and left shoulder  Skin: Negative.   Neurological: Negative.        Objective:    Physical Exam Vitals and nursing note reviewed.  Constitutional:      General: She is not in acute distress.    Appearance: Normal appearance.  HENT:     Head: Normocephalic.  Eyes:     Conjunctiva/sclera: Conjunctivae normal.  Cardiovascular:     Rate and Rhythm: Normal rate and regular rhythm.     Pulses: Normal pulses.     Heart sounds: Normal heart sounds.     Comments: Unable to make chest pain worse or re-create pain with palpation. When she shrugged her shoulders, her pain got worse in her left chest area. Pulmonary:     Effort: Pulmonary effort is normal.     Breath sounds: Normal breath sounds.  Musculoskeletal:        General: Normal range of motion.     Cervical back: Normal range of motion.  Skin:    General: Skin is warm.  Neurological:     General: No focal deficit present.     Mental Status: She is alert and oriented to person, place, and time.  Psychiatric:        Mood and Affect: Mood normal.        Behavior: Behavior normal.        Thought Content: Thought content normal.        Judgment: Judgment normal.     BP 119/78   Pulse 64   Temp 98.1 F (36.7 C)   Wt 233 lb 12.8 oz (106.1 kg)   SpO2 100%   BMI 44.18 kg/m  Wt Readings from Last 3 Encounters:  08/30/20 233 lb 12.8 oz (106.1 kg)  08/04/20 232 lb 3.2 oz (105.3 kg)  07/28/20 229 lb 9.6 oz (104.1 kg)    Health Maintenance Due  Topic Date Due  . COVID-19 Vaccine (1) Never done  . PAP SMEAR-Modifier  08/12/2017    There are no preventive care reminders to display for this patient.   Lab Results  Component Value Date   TSH 4.520 (H) 07/01/2020   Lab Results  Component Value Date   WBC 7.4 07/01/2020   HGB 13.5 07/01/2020   HCT 39.2 07/01/2020   MCV 91 07/01/2020   PLT 387  07/01/2020   Lab Results  Component Value Date   NA 140 07/01/2020   K 4.0 07/01/2020   CO2 23 07/01/2020   GLUCOSE 92 07/01/2020   BUN 6 07/01/2020   CREATININE 0.80 07/01/2020   BILITOT 0.5 07/01/2020   ALKPHOS 100 07/01/2020   AST 14 07/01/2020   ALT 21 07/01/2020   PROT 6.5 07/01/2020   ALBUMIN 4.2 07/01/2020  CALCIUM 9.0 07/01/2020   ANIONGAP 8 09/01/2018   EGFR 98 07/01/2020   Lab Results  Component Value Date   CHOL 163 07/01/2020   Lab Results  Component Value Date   HDL 43 07/01/2020   Lab Results  Component Value Date   LDLCALC 80 07/01/2020   Lab Results  Component Value Date   TRIG 243 (H) 07/01/2020   Lab Results  Component Value Date   CHOLHDL 2.9 Ratio 05/07/2008   Lab Results  Component Value Date   HGBA1C 5.1 07/01/2020       Assessment & Plan:   Problem List Items Addressed This Visit      Other   Elevated TSH - Primary    TSH was slightly elevated 2 months ago. Will recheck TSH today.       Relevant Orders   TSH   Chest pain    Acute chest pain that radiates to left neck and shoulder. EKG obtained which showed NSR with no ST or T wave abnormalities. Lungs clear and heart regular rate and rhythm with no murmurs. Will check CMP, CBC, and d-dimer. ER precautions discussed. She can take ibuprofen/tylenol as needed for pain. Flexeril prn ordered as well. Will follow-up and treat based on lab results.       Relevant Orders   Comp Met (CMET)   CBC w/Diff   D-Dimer, Quantitative   EKG 12-Lead (Completed)       Meds ordered this encounter  Medications  . cyclobenzaprine (FLEXERIL) 10 MG tablet    Sig: Take 1 tablet (10 mg total) by mouth 3 (three) times daily as needed for muscle spasms.    Dispense:  30 tablet    Refill:  0     Charyl Dancer, NP

## 2020-08-30 NOTE — Progress Notes (Signed)
EKG interpreted by me on 08/30/20 showed normal sinus rhythm with a heart rate of 76. No ST or T wave abnormalities.

## 2020-08-30 NOTE — Assessment & Plan Note (Signed)
TSH was slightly elevated 2 months ago. Will recheck TSH today.

## 2020-08-30 NOTE — Assessment & Plan Note (Addendum)
Acute chest pain that radiates to left neck and shoulder. EKG obtained which showed NSR with no ST or T wave abnormalities. Lungs clear and heart regular rate and rhythm with no murmurs. Will check CMP, CBC, and d-dimer. ER precautions discussed. She can take ibuprofen/tylenol as needed for pain. Flexeril prn ordered as well. Will follow-up and treat based on lab results.

## 2020-08-30 NOTE — Patient Instructions (Signed)

## 2020-08-31 LAB — CBC WITH DIFFERENTIAL/PLATELET
Basophils Absolute: 0 10*3/uL (ref 0.0–0.2)
Basos: 1 %
EOS (ABSOLUTE): 0.1 10*3/uL (ref 0.0–0.4)
Eos: 3 %
Hematocrit: 43.2 % (ref 34.0–46.6)
Hemoglobin: 14.7 g/dL (ref 11.1–15.9)
Immature Grans (Abs): 0 10*3/uL (ref 0.0–0.1)
Immature Granulocytes: 0 %
Lymphocytes Absolute: 2.1 10*3/uL (ref 0.7–3.1)
Lymphs: 45 %
MCH: 30.9 pg (ref 26.6–33.0)
MCHC: 34 g/dL (ref 31.5–35.7)
MCV: 91 fL (ref 79–97)
Monocytes Absolute: 0.3 10*3/uL (ref 0.1–0.9)
Monocytes: 6 %
Neutrophils Absolute: 2.2 10*3/uL (ref 1.4–7.0)
Neutrophils: 45 %
Platelets: 345 10*3/uL (ref 150–450)
RBC: 4.75 x10E6/uL (ref 3.77–5.28)
RDW: 12.2 % (ref 11.7–15.4)
WBC: 4.8 10*3/uL (ref 3.4–10.8)

## 2020-08-31 LAB — COMPREHENSIVE METABOLIC PANEL
ALT: 17 IU/L (ref 0–32)
AST: 14 IU/L (ref 0–40)
Albumin/Globulin Ratio: 2.1 (ref 1.2–2.2)
Albumin: 4.5 g/dL (ref 3.8–4.8)
Alkaline Phosphatase: 88 IU/L (ref 44–121)
BUN/Creatinine Ratio: 12 (ref 9–23)
BUN: 9 mg/dL (ref 6–20)
Bilirubin Total: 0.6 mg/dL (ref 0.0–1.2)
CO2: 23 mmol/L (ref 20–29)
Calcium: 9.1 mg/dL (ref 8.7–10.2)
Chloride: 102 mmol/L (ref 96–106)
Creatinine, Ser: 0.76 mg/dL (ref 0.57–1.00)
Globulin, Total: 2.1 g/dL (ref 1.5–4.5)
Glucose: 83 mg/dL (ref 65–99)
Potassium: 4.1 mmol/L (ref 3.5–5.2)
Sodium: 140 mmol/L (ref 134–144)
Total Protein: 6.6 g/dL (ref 6.0–8.5)
eGFR: 104 mL/min/{1.73_m2} (ref >59)

## 2020-08-31 LAB — D-DIMER, QUANTITATIVE: D-DIMER: <0.2 mg/L FEU (ref 0.00–0.49)

## 2020-08-31 LAB — TSH: TSH: 1.83 u[IU]/mL (ref 0.450–4.500)

## 2020-09-04 IMAGING — RF VOIDING CYSTOURETHROGRAM
12 of 13 series · 15 of 16 positions shown · non-contrast
Comparison: CT 10/08/2018.

CLINICAL DATA: Recurrent UTIs.

EXAM:
VOIDING CYSTOURETHROGRAM
TECHNIQUE: After catheterization of the urinary bladder following sterile
technique by nursing personnel, the bladder was filled with 150 ml
Cysto-hypaque 30% by drip infusion. Serial spot images were obtained
during bladder filling and voiding.
FLUOROSCOPY TIME:  Fluoroscopy Time:  5 minutes 36 seconds
Radiation Exposure Index (if provided by the fluoroscopic device):
84.0 mGy

[Series 1: cp_pediatric · 0.17mm/px · 1 of 1 slices shown]
[im 1/1]
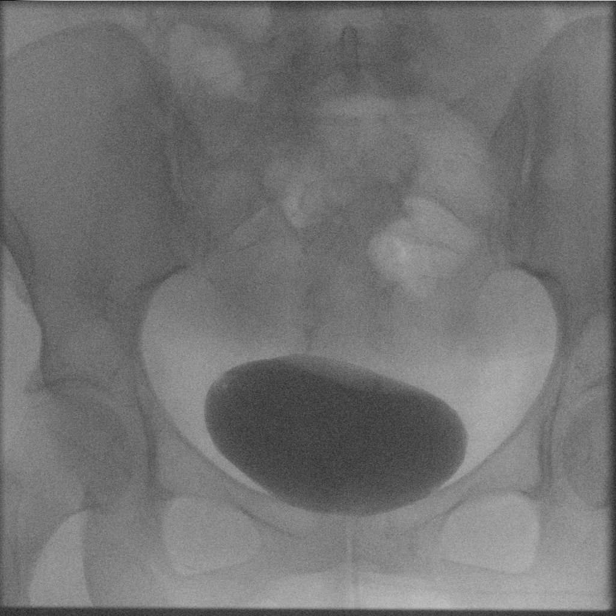

[Series 2: fluoro_pediatric_iodine 2fps_bw · 0.17mm/px · 1 of 1 slices shown (1 of 11)]
[im 1/1]
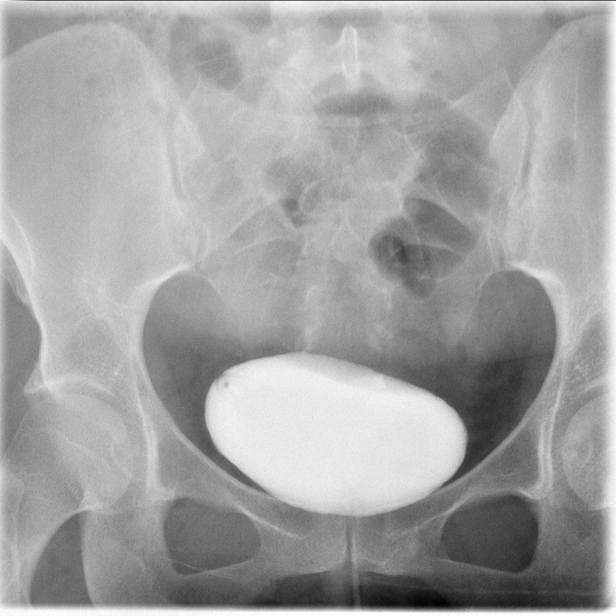

[Series 3: fluoro_pediatric_iodine 2fps_bw · 0.17mm/px · 1 of 1 slices shown (2 of 11)]
[im 1/1]
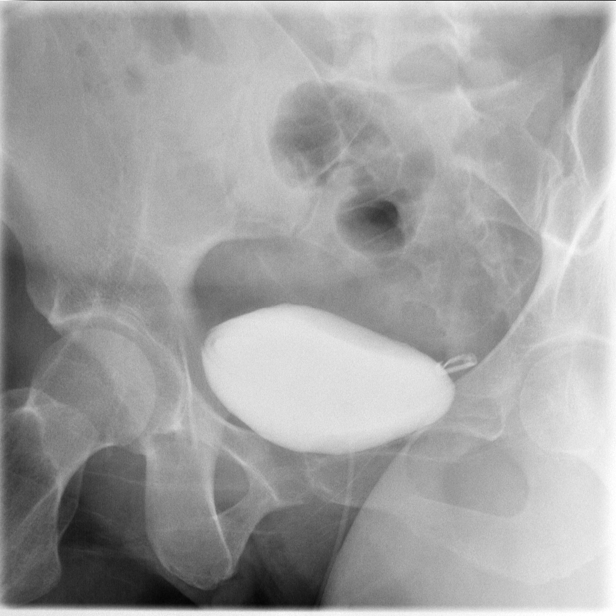

[Series 4: fluoro_pediatric_iodine 2fps_bw · 0.17mm/px · 2 of 2 frames shown (3 of 11)]
[frame 1/2]
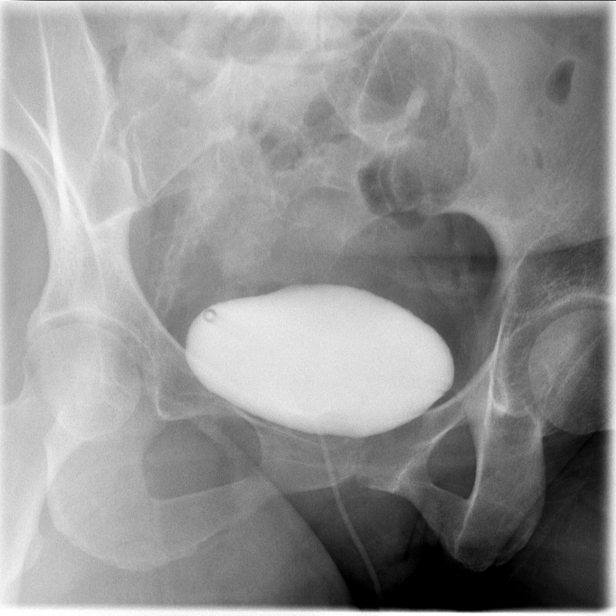
[frame 2/2]
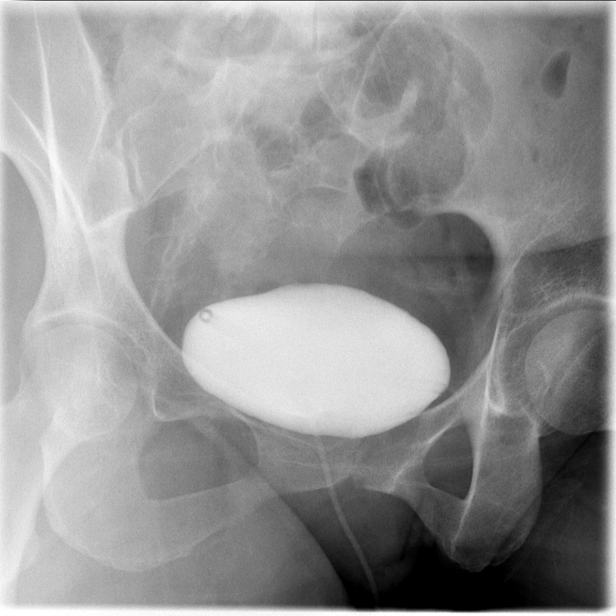

[Series 5: fluoro_pediatric_iodine 2fps_bw · 0.17mm/px · 1 of 1 slices shown (4 of 11)]
[im 1/1]
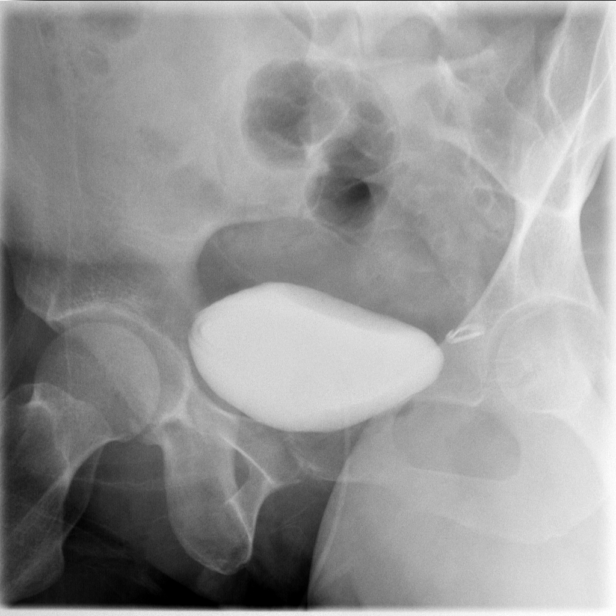

[Series 6: fluoro_pediatric_iodine 2fps_bw · 0.17mm/px · 1 of 1 slices shown (5 of 11)]
[im 1/1]
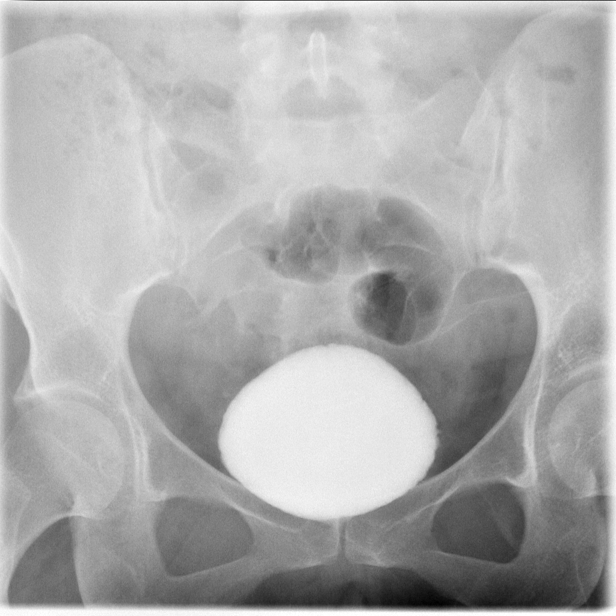

[Series 8: fluoro_pediatric_iodine 2fps_bw · 0.17mm/px · 2 of 2 frames shown (6 of 11)]
[frame 1/2]
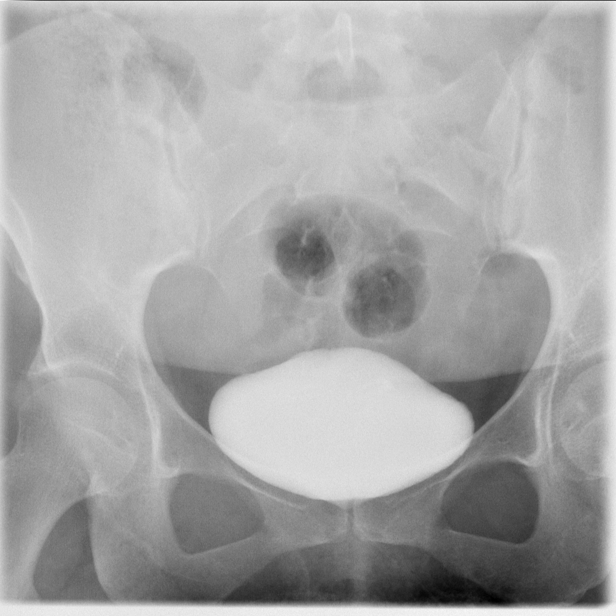
[frame 2/2]
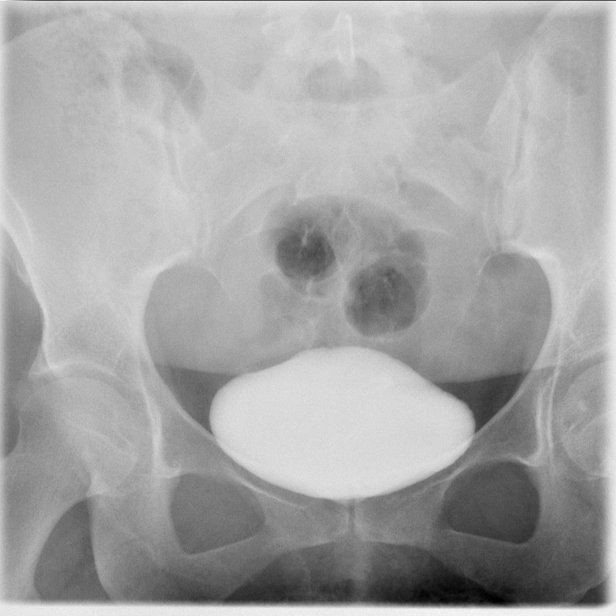

[Series 9: fluoro_pediatric_iodine 2fps_bw · 0.17mm/px · 1 of 1 slices shown (7 of 11)]
[im 1/1]
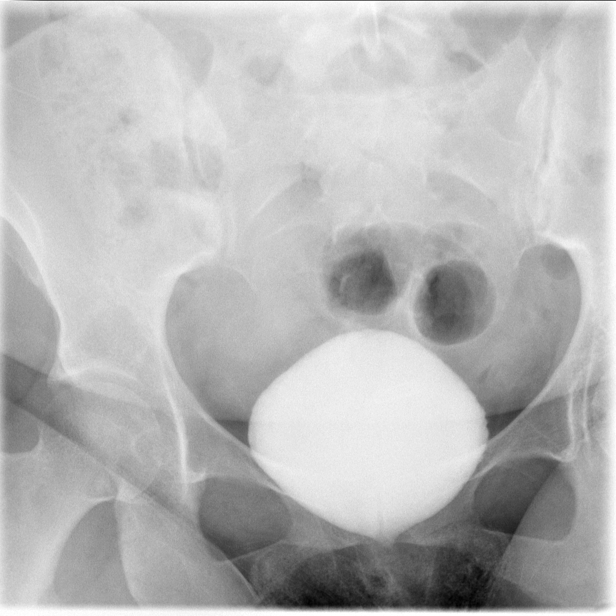

[Series 10: fluoro_pediatric_iodine 2fps_bw · 0.17mm/px · 2 of 2 frames shown (8 of 11)]
[frame 1/2]
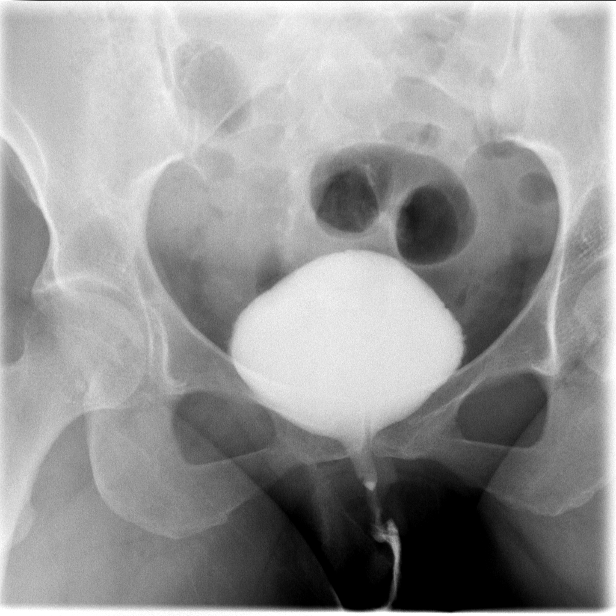
[frame 2/2]
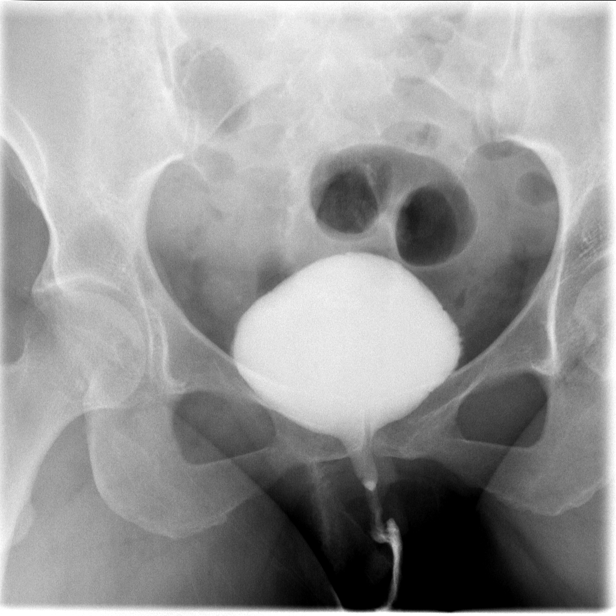

[Series 11: fluoro_pediatric_iodine 2fps_bw · 0.17mm/px · 1 of 1 slices shown (9 of 11)]
[im 1/1]
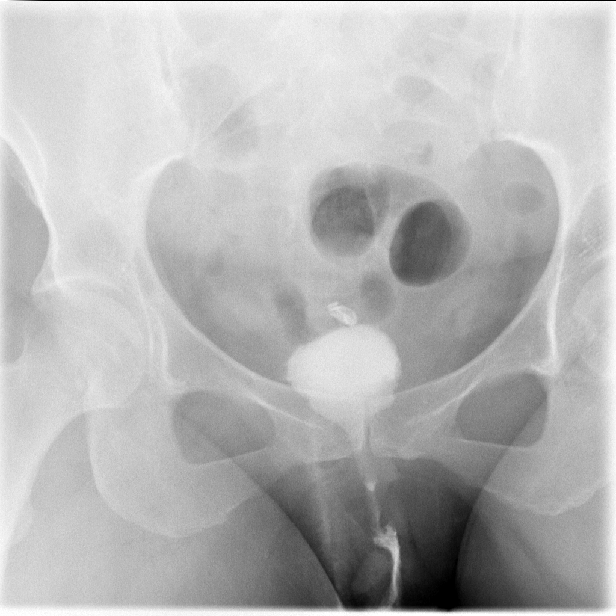

[Series 12: fluoro_pediatric_iodine 2fps_bw · 0.17mm/px · 1 of 1 slices shown (10 of 11)]
[im 1/1]
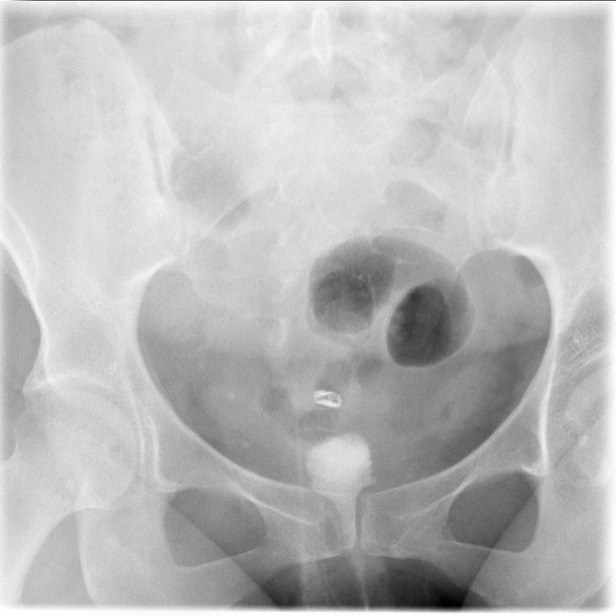

[Series 13: fluoro_pediatric_iodine 2fps_bw · 0.17mm/px · 1 of 1 slices shown (11 of 11)]
[im 1/1]
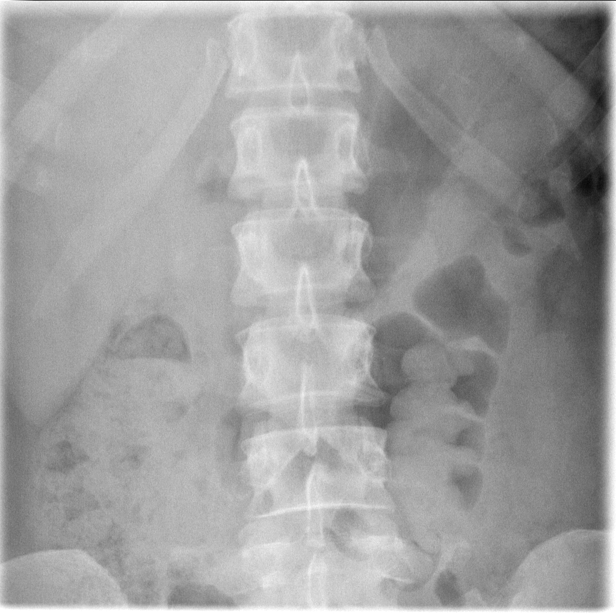

[15 of 16 positions shown; findings below may reference images not displayed]

FINDINGS: Surgical clips left lower pelvis consistent with prior tubal
ligation. Bladder appears normal. Voiding was normal. No evidence of
vesicoureteral reflux. Postvoid residual normal.
IMPRESSION: Normal exam.

## 2020-09-13 ENCOUNTER — Encounter: Payer: Self-pay | Admitting: Nurse Practitioner

## 2020-09-13 ENCOUNTER — Ambulatory Visit: Payer: BC Managed Care – PPO | Admitting: Nurse Practitioner

## 2020-09-13 ENCOUNTER — Other Ambulatory Visit: Payer: Self-pay

## 2020-09-13 VITALS — BP 137/79 | HR 80 | Temp 98.6°F | Ht 61.0 in | Wt 234.0 lb

## 2020-09-13 DIAGNOSIS — Z8659 Personal history of other mental and behavioral disorders: Secondary | ICD-10-CM | POA: Insufficient documentation

## 2020-09-13 DIAGNOSIS — E282 Polycystic ovarian syndrome: Secondary | ICD-10-CM | POA: Diagnosis not present

## 2020-09-13 DIAGNOSIS — Z6841 Body Mass Index (BMI) 40.0 and over, adult: Secondary | ICD-10-CM | POA: Diagnosis not present

## 2020-09-13 MED ORDER — METFORMIN HCL ER 500 MG PO TB24: 500.0000 mg | ORAL_TABLET | Freq: Two times a day (BID) | ORAL | 4 refills | Status: DC

## 2020-09-13 MED ORDER — SPIRONOLACTONE 100 MG PO TABS: 100.0000 mg | ORAL_TABLET | Freq: Two times a day (BID) | ORAL | 4 refills | Status: DC

## 2020-09-13 MED ORDER — CHOLECALCIFEROL 1.25 MG (50000 UT) PO TABS: 1.0000 | ORAL_TABLET | ORAL | 4 refills | Status: DC

## 2020-09-13 NOTE — Assessment & Plan Note (Signed)
BMI 44.21, had lost 60 pounds with Metformin, Spironolactone, and Phentermine months back, but gained all back with stopping medications.  Discussed with her that Phentermine is not prescribed in office, but can restart Spironolactone and Metformin XR, refills sent.  Refer to PCOS plan of care.  Recommended eating smaller high protein, low fat meals more frequently and exercising 30 mins a day 5 times a week with a goal of 10-15lb weight loss in the next 3 months. Patient voiced their understanding and motivation to adhere to these recommendations.  Return in 4 weeks for follow-up.  Consider Wegovy, Victoza, Ozempic, or Contrave -- she will look into coverage for these.

## 2020-09-13 NOTE — Assessment & Plan Note (Signed)
Chronic, ongoing, taking Spironolactone and Metformin months back and wishes to restart these at doses GYN had her on.  Recent K+ 4.1.  At this time will send in scripts for Metformin XR 500 MG BID and Spironolactone 100 MG BID for both PCOS and weight management.  Plan on follow-up in 4 weeks for weight check and K+ evaluation.  Discussed possibility of K+ elevation with Spironolactone.

## 2020-09-13 NOTE — Patient Instructions (Addendum)
Wegovy and Ozempic, Victoza (daily), Contrave (Wellbutrin) -- check on these and see if coverage.     Healthy Eating Following a healthy eating pattern may help you to achieve and maintain a healthy body weight, reduce the risk of chronic disease, and live a long and productive life. It is important to follow a healthy eating pattern at an appropriate calorie level for your body. Your nutritional needs should be met primarily through food by choosing a variety of nutrient-rich foods. What are tips for following this plan? Reading food labels  Read labels and choose the following: ? Reduced or low sodium. ? Juices with 100% fruit juice. ? Foods with low saturated fats and high polyunsaturated and monounsaturated fats. ? Foods with whole grains, such as whole wheat, cracked wheat, brown rice, and wild rice. ? Whole grains that are fortified with folic acid. This is recommended for women who are pregnant or who want to become pregnant.  Read labels and avoid the following: ? Foods with a lot of added sugars. These include foods that contain brown sugar, corn sweetener, corn syrup, dextrose, fructose, glucose, high-fructose corn syrup, honey, invert sugar, lactose, malt syrup, maltose, molasses, raw sugar, sucrose, trehalose, or turbinado sugar.  Do not eat more than the following amounts of added sugar per day:  6 teaspoons (25 g) for women.  9 teaspoons (38 g) for men. ? Foods that contain processed or refined starches and grains. ? Refined grain products, such as white flour, degermed cornmeal, white bread, and white rice. Shopping  Choose nutrient-rich snacks, such as vegetables, whole fruits, and nuts. Avoid high-calorie and high-sugar snacks, such as potato chips, fruit snacks, and candy.  Use oil-based dressings and spreads on foods instead of solid fats such as butter, stick margarine, or cream cheese.  Limit pre-made sauces, mixes, and "instant" products such as flavored rice,  instant noodles, and ready-made pasta.  Try more plant-protein sources, such as tofu, tempeh, black beans, edamame, lentils, nuts, and seeds.  Explore eating plans such as the Mediterranean diet or vegetarian diet. Cooking  Use oil to saut or stir-fry foods instead of solid fats such as butter, stick margarine, or lard.  Try baking, boiling, grilling, or broiling instead of frying.  Remove the fatty part of meats before cooking.  Steam vegetables in water or broth. Meal planning  At meals, imagine dividing your plate into fourths: ? One-half of your plate is fruits and vegetables. ? One-fourth of your plate is whole grains. ? One-fourth of your plate is protein, especially lean meats, poultry, eggs, tofu, beans, or nuts.  Include low-fat dairy as part of your daily diet.   Lifestyle  Choose healthy options in all settings, including home, work, school, restaurants, or stores.  Prepare your food safely: ? Wash your hands after handling raw meats. ? Keep food preparation surfaces clean by regularly washing with hot, soapy water. ? Keep raw meats separate from ready-to-eat foods, such as fruits and vegetables. ? Cook seafood, meat, poultry, and eggs to the recommended internal temperature. ? Store foods at safe temperatures. In general:  Keep cold foods at 5F (4.4C) or below.  Keep hot foods at 15F (60C) or above.  Keep your freezer at [facility] (-17.8C) or below.  Foods are no longer safe to eat when they have been between the temperatures of 40-15F (4.4-60C) for more than 2 hours. What foods should I eat? Fruits Aim to eat 2 cup-equivalents of fresh, canned (in natural juice), or frozen fruits each  day. Examples of 1 cup-equivalent of fruit include 1 small apple, 8 large strawberries, 1 cup canned fruit,  cup dried fruit, or 1 cup 100% juice. Vegetables Aim to eat 2-3 cup-equivalents of fresh and frozen vegetables each day, including different varieties and colors.  Examples of 1 cup-equivalent of vegetables include 2 medium carrots, 2 cups raw, leafy greens, 1 cup chopped vegetable (raw or cooked), or 1 medium baked potato. Grains Aim to eat 6 ounce-equivalents of whole grains each day. Examples of 1 ounce-equivalent of grains include 1 slice of bread, 1 cup ready-to-eat cereal, 3 cups popcorn, or  cup cooked rice, pasta, or cereal. Meats and other proteins Aim to eat 5-6 ounce-equivalents of protein each day. Examples of 1 ounce-equivalent of protein include 1 egg, 1/2 cup nuts or seeds, or 1 tablespoon (16 g) peanut butter. A cut of meat or fish that is the size of a deck of cards is about 3-4 ounce-equivalents.  Of the protein you eat each week, try to have at least 8 ounces come from seafood. This includes salmon, trout, herring, and anchovies. Dairy Aim to eat 3 cup-equivalents of fat-free or low-fat dairy each day. Examples of 1 cup-equivalent of dairy include 1 cup (240 mL) milk, 8 ounces (250 g) yogurt, 1 ounces (44 g) natural cheese, or 1 cup (240 mL) fortified soy milk. Fats and oils  Aim for about 5 teaspoons (21 g) per day. Choose monounsaturated fats, such as canola and olive oils, avocados, peanut butter, and most nuts, or polyunsaturated fats, such as sunflower, corn, and soybean oils, walnuts, pine nuts, sesame seeds, sunflower seeds, and flaxseed. Beverages  Aim for six 8-oz glasses of water per day. Limit coffee to three to five 8-oz cups per day.  Limit caffeinated beverages that have added calories, such as soda and energy drinks.  Limit alcohol intake to no more than 1 drink a day for nonpregnant women and 2 drinks a day for men. One drink equals 12 oz of beer (355 mL), 5 oz of wine (148 mL), or 1 oz of hard liquor (44 mL). Seasoning and other foods  Avoid adding excess amounts of salt to your foods. Try flavoring foods with herbs and spices instead of salt.  Avoid adding sugar to foods.  Try using oil-based dressings, sauces,  and spreads instead of solid fats. This information is based on general [place] nutrition guidelines. For more information, visit [employer]. Exact amounts may vary based on your nutrition needs. Summary  A healthy eating plan may help you to maintain a healthy weight, reduce the risk of chronic diseases, and stay active throughout your life.  Plan your meals. Make sure you eat the right portions of a variety of nutrient-rich foods.  Try baking, boiling, grilling, or broiling instead of frying.  Choose healthy options in all settings, including home, work, school, restaurants, or stores. This information is not intended to replace advice given to you by your health care provider. Make sure you discuss any questions you have with your health care provider. Document Revised: 07/08/2017 Document Reviewed: 07/08/2017 Elsevier Patient Education  [employer].

## 2020-09-13 NOTE — Progress Notes (Signed)
BP 137/79   Pulse 80   Temp 98.6 F (37 C) (Oral)   Ht 5' 1" (1.549 m)   Wt 234 lb (106.1 kg)   LMP 08/23/2020 (Approximate)   SpO2 98%   BMI 44.21 kg/m    Subjective:    Patient ID: [name], female    DOB: Nov 08, 1985, 35 y.o.   MRN: [id number]  HPI: [name] is a 35 y.o. female  Chief Complaint  Patient presents with  . Obesity    Pt states she wants to start back on medication for weight loss- was on phentermine, metformin and spironolactone   OBESITY: Was being followed by GYN and they started her on Spironolactone and Metformin months back for PCOS, then added on Phentermine -- dropped 60 + pounds with this.  She decided when they lost insurance, that she would stop taking all medications at one time -- but then the PCOS started acting up, irregular cycles and facial hair is back.  She feels like failure because she has gained all weight back.  Recent K+ 4.1 in May.  Would like to start back on Spironolactone and Metformin.  Recent A1c 5.1%.  Was taking Spironolactone 100 MG BID and Metformin XR 500 MG BID + Vitamin D 50,000 units weekly.  Her goal is to lose 60 + pounds and maintain loss, endorses being a stress eater at times -- at this time stressors have decreased some.  Does have a history of ADHD treatment as teen and is starting new job soon, concerned about ability to concentrate.  Relevant past medical, surgical, family and social history reviewed and updated as indicated. Interim medical history since our last visit reviewed. Allergies and medications reviewed and updated.  Review of Systems  Constitutional: Negative for activity change, appetite change, diaphoresis, fatigue and fever.  Respiratory: Negative for cough, chest tightness and shortness of breath.   Cardiovascular: Negative for chest pain, palpitations and leg swelling.  Gastrointestinal: Negative.   Endocrine: Negative for cold intolerance and heat intolerance.  Neurological:  Negative.   Psychiatric/Behavioral: Negative.     Per HPI unless specifically indicated above     Objective:    BP 137/79   Pulse 80   Temp 98.6 F (37 C) (Oral)   Ht 5' 1" (1.549 m)   Wt 234 lb (106.1 kg)   LMP 08/23/2020 (Approximate)   SpO2 98%   BMI 44.21 kg/m   Wt Readings from Last 3 Encounters:  09/13/20 234 lb (106.1 kg)  08/30/20 233 lb 12.8 oz (106.1 kg)  08/04/20 232 lb 3.2 oz (105.3 kg)    Physical Exam Vitals and nursing note reviewed.  Constitutional:      General: She is awake. She is not in acute distress.    Appearance: She is well-developed and well-groomed. She is obese. She is not ill-appearing or toxic-appearing.  HENT:     Head: Normocephalic.     Right Ear: Hearing normal.     Left Ear: Hearing normal.  Eyes:     General: Lids are normal.        Right eye: No discharge.        Left eye: No discharge.     Conjunctiva/sclera: Conjunctivae normal.     Pupils: Pupils are equal, round, and reactive to light.  Neck:     Thyroid: No thyromegaly.     Vascular: No carotid bruit.  Cardiovascular:     Rate and Rhythm: Normal rate and regular rhythm.  Heart sounds: Normal heart sounds. No murmur heard. No gallop.   Pulmonary:     Effort: Pulmonary effort is normal. No accessory muscle usage or respiratory distress.     Breath sounds: Normal breath sounds.  Abdominal:     General: Bowel sounds are normal.     Palpations: Abdomen is soft. There is no hepatomegaly or splenomegaly.  Musculoskeletal:     Cervical back: Normal range of motion and neck supple.     Right lower leg: No edema.     Left lower leg: No edema.  Skin:    General: Skin is warm and dry.  Neurological:     Mental Status: She is alert and oriented to person, place, and time.  Psychiatric:        Attention and Perception: Attention normal.        Mood and Affect: Mood normal.        Speech: Speech normal.        Behavior: Behavior normal. Behavior is cooperative.         Thought Content: Thought content normal.    Results for orders placed or performed in visit on 08/30/20  Comp Met (CMET)  Result Value Ref Range   Glucose 83 65 - 99 mg/dL   BUN 9 6 - 20 mg/dL   Creatinine, Ser 0.76 0.57 - 1.00 mg/dL   eGFR 104 >59 mL/min/1.73   BUN/Creatinine Ratio 12 9 - 23   Sodium 140 134 - 144 mmol/L   Potassium 4.1 3.5 - 5.2 mmol/L   Chloride 102 96 - 106 mmol/L   CO2 23 20 - 29 mmol/L   Calcium 9.1 8.7 - 10.2 mg/dL   Total Protein 6.6 6.0 - 8.5 g/dL   Albumin 4.5 3.8 - 4.8 g/dL   Globulin, Total 2.1 1.5 - 4.5 g/dL   Albumin/Globulin Ratio 2.1 1.2 - 2.2   Bilirubin Total 0.6 0.0 - 1.2 mg/dL   Alkaline Phosphatase 88 44 - 121 IU/L   AST 14 0 - 40 IU/L   ALT 17 0 - 32 IU/L  TSH  Result Value Ref Range   TSH 1.830 0.450 - 4.500 uIU/mL  CBC w/Diff  Result Value Ref Range   WBC 4.8 3.4 - 10.8 x10E3/uL   RBC 4.75 3.77 - 5.28 x10E6/uL   Hemoglobin 14.7 11.1 - 15.9 g/dL   Hematocrit 43.2 34.0 - 46.6 %   MCV 91 79 - 97 fL   MCH 30.9 26.6 - 33.0 pg   MCHC 34.0 31.5 - 35.7 g/dL   RDW 12.2 11.7 - 15.4 %   Platelets 345 150 - 450 x10E3/uL   Neutrophils 45 Not Estab. %   Lymphs 45 Not Estab. %   Monocytes 6 Not Estab. %   Eos 3 Not Estab. %   Basos 1 Not Estab. %   Neutrophils Absolute 2.2 1.4 - 7.0 x10E3/uL   Lymphocytes Absolute 2.1 0.7 - 3.1 x10E3/uL   Monocytes Absolute 0.3 0.1 - 0.9 x10E3/uL   EOS (ABSOLUTE) 0.1 0.0 - 0.4 x10E3/uL   Basophils Absolute 0.0 0.0 - 0.2 x10E3/uL   Immature Granulocytes 0 Not Estab. %   Immature Grans (Abs) 0.0 0.0 - 0.1 x10E3/uL  D-Dimer, Quantitative  Result Value Ref Range   D-DIMER <0.20 0.00 - 0.49 mg/L FEU      Assessment & Plan:   Problem List Items Addressed This Visit      Endocrine   PCOS (polycystic ovarian syndrome)    Chronic, ongoing, taking  Spironolactone and Metformin months back and wishes to restart these at doses GYN had her on.  Recent K+ 4.1.  At this time will send in scripts for Metformin XR  500 MG BID and Spironolactone 100 MG BID for both PCOS and weight management.  Plan on follow-up in 4 weeks for weight check and K+ evaluation.  Discussed possibility of K+ elevation with Spironolactone.        Other   Obesity - Primary    BMI 44.21, had lost 60 pounds with Metformin, Spironolactone, and Phentermine months back, but gained all back with stopping medications.  Discussed with her that Phentermine is not prescribed in office, but can restart Spironolactone and Metformin XR, refills sent.  Refer to PCOS plan of care.  Recommended eating smaller high protein, low fat meals more frequently and exercising 30 mins a day 5 times a week with a goal of 10-15lb weight loss in the next 3 months. Patient voiced their understanding and motivation to adhere to these recommendations.  Return in 4 weeks for follow-up.  Consider Wegovy, Victoza, Ozempic, or Contrave -- she will look into coverage for these.       Relevant Medications   metFORMIN (GLUCOPHAGE XR) 500 MG 24 hr tablet       Follow up plan: Return in about 4 weeks (around 10/11/2020) for WEIGHT LOSS.

## 2020-09-21 ENCOUNTER — Telehealth: Payer: Self-pay

## 2020-09-21 NOTE — Telephone Encounter (Signed)
Jolene, Will you please provide a diagnosis code for the patient's cyst removal for DOS 07/01/20.  See the note below for details.    Copied from CRM [id number]. Topic: General - Inquiry >> Aug 30, 2020  1:06 PM Elliot Gault wrote: Reason for CRM: patient was advised by her insurance to contact PCP regarding date of service  07/01/2020. The patient states the appointment was regarding a cyst on her scalp and not obesity. Patient insurance is not covering the obesity part and states that it was a 5 minute discussion and would like the bill to be re coded to reflect the cyst concern only

## 2020-09-28 ENCOUNTER — Ambulatory Visit: Payer: BC Managed Care – PPO | Admitting: Neurology

## 2020-09-28 ENCOUNTER — Encounter: Payer: Self-pay | Admitting: Neurology

## 2020-09-28 VITALS — BP 112/82 | HR 97 | Ht 62.0 in | Wt 230.3 lb

## 2020-09-28 DIAGNOSIS — R519 Headache, unspecified: Secondary | ICD-10-CM | POA: Diagnosis not present

## 2020-09-28 DIAGNOSIS — G4733 Obstructive sleep apnea (adult) (pediatric): Secondary | ICD-10-CM | POA: Diagnosis not present

## 2020-09-28 DIAGNOSIS — G4719 Other hypersomnia: Secondary | ICD-10-CM

## 2020-09-28 DIAGNOSIS — Z820 Family history of epilepsy and other diseases of the nervous system: Secondary | ICD-10-CM

## 2020-09-28 NOTE — Patient Instructions (Signed)

## 2020-09-28 NOTE — Progress Notes (Signed)
Subjective:    Patient ID: [name] is a 35 y.o. female.  HPI    Huston Foley, MD, PhD [facility] [address] [address] [place], [place] [address]  Dear [name],   I saw your patient, [name], upon your kind request in my sleep clinic today for initial consultation of her sleep disorder, in particular, evaluation of her prior diagnosis of obstructive sleep apnea.  The patient is unaccompanied today.  As you know, [name] is a 35 year old right-handed woman with an underlying medical history of PCOS, ADHD, allergies, anxiety, depression, interstitial cystitis, kidney stones, and severe obesity with a BMI of over 40, who was previously diagnosed with obstructive sleep apnea. Prior sleep study results are not available for my review today.  She estimates that testing was about 12 years ago.  She had a sleep study in [place].  She was given CPAP and had a full facemask but could not tolerate the pressure or the mask and woke up in a panic.  She gave up using her CPAP.  She would be willing to get retested and consider CPAP or AutoPap therapy again so long as she can tolerate the mask and the pressure.  She has a family history of sleep apnea, her father has sleep apnea and paternal uncle and a paternal aunt all have sleep apnea.  She has had weight fluctuation.  About a year and a half ago she was able to lose about 60 pounds but gained most of it back.  She has had recent stressors.  She is going to start a new job, she was previously working as a Production designer, theatre/television/film for an apartment complex.  She worked part-time when her son was younger and now that he is 76, she is going to start a new job.  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.  She is married and lives with her husband, they currently also have husband's grandson and his mom staying with them.  Bedtime is generally between 11 and midnight and rise time between 630 and  7 AM.  She has had frequent morning headaches and occasionally uses Excedrin Migraine.  She does not wake up rested.  Epworth sleepiness score is 17 out of 24, fatigue severity score is 48 out of 63.  She is a non-smoker and drinks alcohol rarely, she drinks caffeine occasionally in the form of soda and coffee.  Her husband has witnessed her sleep apnea events.  She has had weight fluctuation.  She had a tonsillectomy at age 9 due to frequent tonsillitis and strep throat.  Her Past Medical History Is Significant For: Past Medical History:  Diagnosis Date   ADHD (attention deficit hyperactivity disorder)    Allergy    Anxiety    Bipolar 2 disorder (HCC)    Blood transfusion as the cause of abnormal reaction of patient, or of later complication, without misadventure at the time of procedure    lost to much blood during delivery   Depression    Interstitial cystitis    Kidney stones    PCOS (polycystic ovarian syndrome)    Sleep apnea     Her Past Surgical History Is Significant For: Past Surgical History:  Procedure Laterality Date   interstitial cystitis laparoscopy     KIDNEY STONE SURGERY     basket, lithotripsy   KNEE ARTHROSCOPY Right    TONSILLECTOMY AND ADENOIDECTOMY     TUBAL LIGATION  Her Family History Is Significant For: Family History  Problem Relation Age of Onset   Hyperlipidemia Mother    Hypertension Mother    Hypertension Father    Hyperlipidemia Father    Hyperlipidemia Sister    Hyperlipidemia Maternal Grandfather    Hypertension Maternal Grandfather    Hypertension Paternal Grandmother    Hyperlipidemia Paternal Grandmother    Hyperlipidemia Paternal Grandfather    Hypertension Paternal Grandfather     Her Social History Is Significant For: Social History   Socioeconomic History   Marital status: Married    Spouse name: Not on file   Number of children: Not on file   Years of education: Not on file   Highest education level: Not on file   Occupational History   Not on file  Tobacco Use   Smoking status: Never   Smokeless tobacco: Never  Vaping Use   Vaping Use: Never used  Substance and Sexual Activity   Alcohol use: Yes    Comment: on occasion   Drug use: No   Sexual activity: Yes    Birth control/protection: Surgical  Other Topics Concern   Not on file  Social History Narrative   Not on file   Social Determinants of Health   Financial Resource Strain: Not on file  Food Insecurity: Not on file  Transportation Needs: Not on file  Physical Activity: Not on file  Stress: Not on file  Social Connections: Not on file    Her Allergies Are:  Allergies  Allergen Reactions   Latex Anaphylaxis   Penicillins Nausea And Vomiting  :   Her Current Medications Are:  Outpatient Encounter Medications as of 09/28/2020  Medication Sig   Cholecalciferol 1.25 MG (50000 UT) TABS Take 1 tablet by mouth once a week.   metFORMIN (GLUCOPHAGE XR) 500 MG 24 hr tablet Take 1 tablet (500 mg total) by mouth 2 (two) times daily.   spironolactone (ALDACTONE) 100 MG tablet Take 1 tablet (100 mg total) by mouth 2 (two) times daily.   [DISCONTINUED] cyclobenzaprine (FLEXERIL) 10 MG tablet Take 1 tablet (10 mg total) by mouth 3 (three) times daily as needed for muscle spasms. (Patient not taking: Reported on 09/13/2020)   No facility-administered encounter medications on file as of 09/28/2020.  :   Review of Systems:  Out of a complete 14 point review of systems, all are reviewed and negative with the exception of these symptoms as listed below:  Review of Systems  Neurological:        Here for sleep consult. Prior sleep study 10 + years ago. OSA was found, CPAP was tried but she could not tolerate due to panic attacks while wearing the mask.  Epworth Sleepiness Scale 0= would never doze 1= slight chance of dozing 2= moderate chance of dozing 3= high chance of dozing  Sitting and reading:3 Watching TV:2 Sitting inactive in a  public place (ex. Theater or meeting):2 As a passenger in a car for an hour without a break:3 Lying down to rest in the afternoon:3 Sitting and talking to someone:1 Sitting quietly after lunch (no alcohol):3 In a car, while stopped in traffic:0 Total:17    Objective:  Neurological Exam  Physical Exam Physical Examination:   Vitals:   09/28/20 1332  BP: 112/82  Pulse: 97  SpO2: 100%   General Examination: The patient is a very pleasant 35 y.o. female in no acute distress. She appears well-developed and well-nourished and well groomed.   HEENT: Normocephalic, atraumatic, pupils  are equal, round and reactive to light, extraocular tracking is good without limitation to gaze excursion or nystagmus noted. Hearing is grossly intact. Face is symmetric with normal facial animation. Speech is clear with no dysarthria noted. There is no hypophonia. There is no lip, neck/head, jaw or voice tremor. Neck is supple with full range of passive and active motion. There are no carotid bruits on auscultation. Oropharynx exam reveals: mild mouth dryness, good dental hygiene and moderate airway crowding, due to small airway entry, Mallampati class II, prominent uvula noted.  Neck circumference of 15-1/2 inches.  Tongue protrudes centrally and palate elevates symmetrically.  Chest: Clear to auscultation without wheezing, rhonchi or crackles noted.  Heart: S1+S2+0, regular and normal without murmurs, rubs or gallops noted.   Abdomen: Soft, non-tender and non-distended with normal bowel sounds appreciated on auscultation.  Extremities: There is no pitting edema in the distal lower extremities bilaterally.   Skin: Warm and dry without trophic changes noted.   Musculoskeletal: exam reveals no obvious joint deformities, tenderness or joint swelling or erythema.   Neurologically:  Mental status: The patient is awake, alert and oriented in all 4 spheres. Her immediate and remote memory, attention, language  skills and fund of knowledge are appropriate. There is no evidence of aphasia, agnosia, apraxia or anomia. Speech is clear with normal prosody and enunciation. Thought process is linear. Mood is normal and affect is normal.  Cranial nerves II - XII are as described above under HEENT exam.  Motor exam: Normal bulk, strength and tone is noted. There is no tremor, Romberg is negative. Fine motor skills and coordination: grossly intact.  Cerebellar testing: No dysmetria or intention tremor. There is no truncal or gait ataxia.  Sensory exam: intact to light touch in the upper and lower extremities.  Gait, station and balance: She stands easily. No veering to one side is noted. No leaning to one side is noted. Posture is age-appropriate and stance is narrow based. Gait shows normal stride length and normal pace. No problems turning are noted. Tandem walk is unremarkable.                Assessment and Plan:  In summary, Jacki ConesHeather Rose Cropp is a very pleasant 35 y.o.-year old female with an underlying medical history of PCOS, ADHD, allergies, anxiety, depression, interstitial cystitis, kidney stones, and severe obesity with a BMI of over 40, whose history and physical exam are in keeping with obstructive sleep apnea (OSA). I had a long chat with the patient about my findings and the diagnosis of OSA, its prognosis and treatment options. We talked about medical treatments, surgical interventions and non-pharmacological approaches. I explained in particular the risks and ramifications of untreated moderate to severe OSA, especially with respect to developing cardiovascular disease down the Road, including congestive heart failure, difficult to treat hypertension, cardiac arrhythmias, or stroke. Even type 2 diabetes has, in part, been linked to untreated OSA. Symptoms of untreated OSA include daytime sleepiness, memory problems, mood irritability and mood disorder such as depression and anxiety, lack of energy, as  well as recurrent headaches, especially morning headaches. We talked about trying to maintain a healthy lifestyle in general, as well as the importance of weight control. We also talked about the importance of good sleep hygiene. I recommended the following at this time: sleep study.   I explained the sleep test procedure to the patient and also outlined possible surgical and non-surgical treatment options of OSA, including the use of a custom-made dental  device (which would require a referral to a specialist dentist or oral surgeon), upper airway surgical options, such as traditional UPPP or a novel less invasive surgical option in the form of Inspire hypoglossal nerve stimulation (which would involve a referral to an ENT surgeon). I also explained the CPAP treatment option to the patient, who indicated that she would be willing to try CPAP if the need arises. I explained the importance of being compliant with PAP treatment, not only for insurance purposes but primarily to improve Her symptoms, and for the patient's long term health benefit, including to reduce Her cardiovascular risks. I answered all her questions today and the patient was in agreement. I plan to see her back after the sleep study is completed and encouraged her to call with any interim questions, concerns, problems or updates.   Thank you very much for allowing me to participate in the care of this nice patient. If I can be of any further assistance to you please do not hesitate to call me at [phone].  Sincerely,   Huston Foley, MD, PhD

## 2020-10-24 ENCOUNTER — Ambulatory Visit: Payer: BC Managed Care – PPO | Admitting: Neurology

## 2020-10-24 DIAGNOSIS — Z820 Family history of epilepsy and other diseases of the nervous system: Secondary | ICD-10-CM

## 2020-10-24 DIAGNOSIS — R519 Headache, unspecified: Secondary | ICD-10-CM

## 2020-10-24 DIAGNOSIS — G4719 Other hypersomnia: Secondary | ICD-10-CM

## 2020-10-24 DIAGNOSIS — G4733 Obstructive sleep apnea (adult) (pediatric): Secondary | ICD-10-CM

## 2020-10-25 NOTE — Progress Notes (Signed)
See procedure note.

## 2020-11-02 ENCOUNTER — Encounter: Payer: Self-pay | Admitting: Nurse Practitioner

## 2020-11-02 DIAGNOSIS — G4733 Obstructive sleep apnea (adult) (pediatric): Secondary | ICD-10-CM | POA: Insufficient documentation

## 2020-11-02 NOTE — Procedures (Signed)
   [facility]  HOME SLEEP TEST (Watch PAT) REPORT  STUDY DATE: 10/24/2020  DOB: 04/14/1985  MRN: [id number]  ORDERING CLINICIAN: Huston Foley, MD, PhD   REFERRING CLINICIAN: Marjie Skiff, NP   CLINICAL INFORMATION/HISTORY: 35 year old right-handed woman with an underlying medical history of PCOS, ADHD, allergies, anxiety, depression, interstitial cystitis, kidney stones, and severe obesity with a BMI of over 40, who was previously diagnosed with obstructive sleep apnea. She was given CPAP and had a full facemask but could not tolerate the pressure or the mask and woke up in a panic.  She gave up using her CPAP.    Epworth sleepiness score: 17/24.  BMI: 42.1 kg/m  FINDINGS:   Sleep Summary:   Total Recording Time (hours, min): 8 hours, 5 minutes  Total Sleep Time (hours, min):  6 hours, 31 minutes   Percent REM (%):    17.1%   Respiratory Indices:   Calculated pAHI (per hour):  15.0/hour         REM pAHI:    51.2/hour       NREM pAHI: 7.2/hour  Oxygen Saturation Statistics:    Oxygen Saturation (%) Mean: 94%   Minimum oxygen saturation (%):                 81%   O2 Saturation Range (%): 81-98%    O2 Saturation (minutes) <=88%: 3.5 min  Pulse Rate Statistics:   Pulse Mean (bpm):    71/min    Pulse Range (48-102/min)   IMPRESSION: OSA (obstructive sleep apnea)  RECOMMENDATION:  This home sleep test demonstrates moderate obstructive sleep apnea with a total AHI of 15/hour and O2 nadir of 81%.  Snoring was detected in the mild-to-moderate range, at times in the lower range.  Treatment with positive airway pressure is recommended. The patient will be advised to proceed with an autoPAP titration/trial at home for now. A full night titration study may be considered to optimize treatment settings, if needed down the road. Please note that untreated obstructive sleep apnea may carry additional perioperative morbidity. Patients with significant  obstructive sleep apnea should receive perioperative PAP therapy and the surgeons and particularly the anesthesiologist should be informed of the diagnosis and the severity of the sleep disordered breathing. The patient should be cautioned not to drive, work at heights, or operate dangerous or heavy equipment when tired or sleepy. Review and reiteration of good sleep hygiene measures should be pursued with any patient. Other causes of the patient's symptoms, including circadian rhythm disturbances, an underlying mood disorder, medication effect and/or an underlying medical problem cannot be ruled out based on this test. Clinical correlation is recommended. The patient and her referring provider will be notified of the test results. The patient will be seen in follow up in sleep clinic at [facility].  I certify that I have reviewed the raw data recording prior to the issuance of this report in accordance with the standards of the American Academy of Sleep Medicine (AASM).  INTERPRETING PHYSICIAN:   Huston Foley, MD, PhD  Board Certified in Neurology and Sleep Medicine  [facility] [address], [address] [place], [place] [address] [phone]

## 2020-11-02 NOTE — Addendum Note (Signed)
Addended by: Huston Foley on: 11/02/2020 05:57 PM   Modules accepted: Orders

## 2020-11-03 ENCOUNTER — Telehealth: Payer: Self-pay

## 2020-11-03 NOTE — Telephone Encounter (Signed)
-----   Message from Huston Foley, MD sent at 11/02/2020  5:57 PM EDT ----- Patient referred by Aura Dials, NP, seen by me on 09/28/20, patient had a HST on 10/24/20.    Please call and notify the patient that the recent home sleep test showed obstructive sleep apnea in the moderate range. I recommend treatment in the form of autoPAP, which means, that we don't have to bring her in for a sleep study with CPAP, but will let her start using a so called autoPAP machine at home, which is a CPAP-like machine with self-adjusting pressures. We will send the order to a local DME company (of her choice, or as per insurance requirement). The DME representative will fit her with a mask, educate her on how to use the machine, how to put the mask on, etc. I have placed an order in the chart. Please send the order, talk to patient, send report to referring MD. We will need a FU in sleep clinic for 10 weeks post-PAP set up, please arrange that with me or one of our NPs. Also reinforce the need for compliance with treatment. Thanks,   Huston Foley, MD, PhD [facility])

## 2020-11-03 NOTE — Telephone Encounter (Signed)
I called pt. I advised pt that Dr. Frances Furbish reviewed their sleep study results and found that pt has moderate osa. Dr. Frances Furbish recommends that pt start autopap . I reviewed PAP compliance expectations with the pt. Pt is agreeable to starting an auto-PAP. I advised pt that an order will be sent to a DME, [facility], and [facility] will call the pt within about one week after they file with the pt's insurance. [facility] will show the pt how to use the machine, fit for masks, and troubleshoot the auto-PAP if needed. A follow up appt was made for insurance purposes with Dr. Frances Furbish on 01/26/2021 at 200 pm. Pt verbalized understanding to arrive 15 minutes early and bring their auto-PAP. A letter with all of this information in it will be mailed to the pt as a reminder. I verified with the pt that the address we have on file is correct. Pt verbalized understanding of results. Pt had no questions at this time but was encouraged to call back if questions arise. I have sent the order to [facility] and have received confirmation that they have received the order.

## 2021-01-16 ENCOUNTER — Ambulatory Visit: Payer: Self-pay

## 2021-01-16 NOTE — Telephone Encounter (Signed)
  Pt is having crusty red eye lids ongoing for a few days.  She states that she changes out her eye makeup often.  No pain or pus. No effect on her vision.   I was able to secure an appoinment with Jolene in the morning.   Pt agrees with plan.   Reason for Disposition  [1] MILD eyelid  pain is a recurrent problem AND [2] red and crusty eyelids  Answer Assessment - Initial Assessment Questions 1. ONSET: "When did the pain start?" (e.g., minutes, hours, days)     na 2. TIMING: "Does the pain come and go, or has it been constant since it started?" (e.g., constant, intermittent, fleeting)     na 3. SEVERITY: "How bad is the pain?"   (Scale 1-10; mild, moderate or severe)   - MILD (1-3): doesn't interfere with normal activities    - MODERATE (4-7): interferes with normal activities or awakens from sleep    - SEVERE (8-10): excruciating pain and patient unable to do normal activities     na 4. LOCATION: "Where does it hurt?"  (e.g., eyelid, eye, cheekbone)     No pain. Crusty and red on the eye lid 5. CAUSE: "What do you think is causing the pain?"     unknown 6. VISION: "Do you have blurred vision or changes in your vision?"      no 7. EYE DISCHARGE: "Is there any discharge (pus) from the eye(s)?"  If yes, ask: "What color is it?"      na 8. FEVER: "Do you have a fever?" If Yes, ask: "What is it, how was it measured, and when did it start?"      na 9. OTHER SYMPTOMS: "Do you have any other symptoms?" (e.g., headache, nasal discharge, facial rash)     na 10. PREGNANCY: "Is there any chance you are pregnant?" "When was your last menstrual period?"       na  Protocols used: Eye Pain and Other Symptoms-A-AH

## 2021-01-17 ENCOUNTER — Ambulatory Visit: Payer: BC Managed Care – PPO | Admitting: Nurse Practitioner

## 2021-01-17 ENCOUNTER — Encounter: Payer: Self-pay | Admitting: Nurse Practitioner

## 2021-01-17 ENCOUNTER — Other Ambulatory Visit: Payer: Self-pay

## 2021-01-17 DIAGNOSIS — H01139 Eczematous dermatitis of unspecified eye, unspecified eyelid: Secondary | ICD-10-CM

## 2021-01-17 MED ORDER — DESONIDE 0.05 % EX CREA: TOPICAL_CREAM | Freq: Two times a day (BID) | CUTANEOUS | 0 refills | Status: AC

## 2021-01-17 NOTE — Patient Instructions (Signed)
Atopic Dermatitis Atopic dermatitis is a skin disorder that causes inflammation of the skin. It is marked by a red rash and itchy, dry, scaly skin. It is the most common type of eczema. Eczema is a group of skin conditions that cause the skin to become rough and swollen. This condition is generally worse during the cooler wintermonths and often improves during the warm summer months. Atopic dermatitis usually starts showing signs in infancy and can last through adulthood. This condition cannot be passed from one person to another (is not contagious). Atopic dermatitis may not always be present, but when it is, it is called aflare-up. What are the causes? The exact cause of this condition is not known. Flare-ups may be triggered by: Coming in contact with something that you are sensitive or allergic to (allergen). Stress. Certain foods. Extremely hot or cold weather. Harsh chemicals and soaps. Dry air. Chlorine. What increases the risk? This condition is more likely to develop in people who have a personal or family history of: Eczema. Allergies. Asthma. Hay fever. What are the signs or symptoms? Symptoms of this condition include: Dry, scaly skin. Red, itchy rash. Itchiness, which can be severe. This may occur before the skin rash. This can make sleeping difficult. Skin thickening and cracking that can occur over time. How is this diagnosed? This condition is diagnosed based on: Your symptoms. Your medical history. A physical exam. How is this treated? There is no cure for this condition, but symptoms can usually be controlled. Treatment focuses on: Controlling the itchiness and scratching. You may be given medicines, such as antihistamines or steroid creams. Limiting exposure to allergens. Recognizing situations that cause stress and developing a plan to manage stress. If your atopic dermatitis does not get better with medicines, or if it is all over your body (widespread), a  treatment using a specific type of light (phototherapy) may be used. Follow these instructions at home: Skin care  Keep your skin well moisturized. Doing this seals in moisture and helps to prevent dryness. Use unscented lotions that have petroleum in them. Avoid lotions that contain alcohol or water. They can dry the skin. Keep baths or showers short (less than 5 minutes) in warm water. Do not use hot water. Use mild, unscented cleansers for bathing. Avoid soap and bubble bath. Apply a moisturizer to your skin right after a bath or shower. Do not apply anything to your skin without checking with your health care provider.  General instructions Take or apply over-the-counter and prescription medicines only as told by your health care provider. Dress in clothes made of cotton or cotton blends. Dress lightly because heat increases itchiness. When washing your clothes, rinse your clothes twice so all of the soap is removed. Avoid any triggers that can cause a flare-up. Keep your fingernails cut short. Avoid scratching. Scratching makes the rash and itchiness worse. A break in the skin from scratching could result in a skin infection (impetigo). Do not be around people who have cold sores or fever blisters. If you get the infection, it may cause your atopic dermatitis to worsen. Keep all follow-up visits. This is important. Contact a health care provider if: Your itchiness interferes with sleep. Your rash gets worse or is not better within one week of starting treatment. You have a fever. You have a rash flare-up after having contact with someone who has cold sores or fever blisters. Get help right away if: You develop pus or soft yellow scabs in the rash area.   Summary Atopic dermatitis causes a red rash and itchy, dry, scaly skin. Treatment focuses on controlling the itchiness and scratching, limiting exposure to things that you are sensitive or allergic to (allergens), recognizing  situations that cause stress, and developing a plan to manage stress. Keep your skin well moisturized. Keep baths or showers shorter than 5 minutes and use warm water. Do not use hot water. This information is not intended to replace advice given to you by your health care provider. Make sure you discuss any questions you have with your healthcare provider. Document Revised: 01/04/2020 Document Reviewed: 01/04/2020 Elsevier Patient Education  2022 [employer]  

## 2021-01-17 NOTE — Progress Notes (Signed)
BP 104/71   Pulse 78   Temp 98.3 F (36.8 C) (Oral)   Wt 238 lb 12.8 oz (108.3 kg)   SpO2 98%   BMI 43.68 kg/m    Subjective:    Patient ID: [name], female    DOB: 04-29-1985, 35 y.o.   MRN: [id number]  HPI: [name] is a 35 y.o. female  Chief Complaint  Patient presents with   Rash    Patient notices she had a dry patch on her R eye for about 2 weeks and then it spread across her R eyelid and now it is has spread to her L eye. Patient states she tried washing with baby soap and Vaseline. Patient states it itches and she denies trying any medication for the itching.    Flu Vaccine    Patient states she will have her Flu Vaccine at her work this year, as she typically does not give immunization, but it is mandatory at work.    RASH Started out with a dry patch to right eye about 2 weeks ago, used Vaseline and got a little better.  Then last week the area started to get red and spread to left eyelid.  No recent changes to make-up, soap, shampoo, or facial products.  No recent time out in garden.    Does have seasonal allergies -- also allergic to Latex.  Does not taking any oral allergy medications at this time. Duration:  weeks  Location:  eyes   Itching: yes Burning: no Redness: yes Oozing: no Scaling: yes Blisters: no Painful: no Fevers: no Change in detergents/soaps/personal care products: no Recent illness: no Recent travel:no History of same: no Context: fluctuating Alleviating factors:  Vaseline and lotion/moisturizer Treatments attempted: Vaseline and lotion/moisturizer Shortness of breath: no  Throat/tongue swelling: no Myalgias/arthralgias: no   Relevant past medical, surgical, family and social history reviewed and updated as indicated. Interim medical history since our last visit reviewed. Allergies and medications reviewed and updated.  Review of Systems  Constitutional:  Negative for activity change, appetite change, fatigue  and fever.  Respiratory: Negative.    Cardiovascular: Negative.   Skin:  Positive for rash.  Neurological: Negative.   Psychiatric/Behavioral: Negative.     Per HPI unless specifically indicated above     Objective:    BP 104/71   Pulse 78   Temp 98.3 F (36.8 C) (Oral)   Wt 238 lb 12.8 oz (108.3 kg)   SpO2 98%   BMI 43.68 kg/m   Wt Readings from Last 3 Encounters:  01/17/21 238 lb 12.8 oz (108.3 kg)  09/28/20 230 lb 5 oz (104.5 kg)  09/13/20 234 lb (106.1 kg)    Physical Exam Vitals and nursing note reviewed.  Constitutional:      General: She is awake. She is not in acute distress.    Appearance: She is well-developed and well-groomed. She is obese. She is not ill-appearing or toxic-appearing.  HENT:     Head: Normocephalic.     Right Ear: Hearing normal.     Left Ear: Hearing normal.  Eyes:     General: Lids are normal.        Right eye: No discharge.        Left eye: No discharge.     Extraocular Movements: Extraocular movements intact.     Conjunctiva/sclera: Conjunctivae normal.     Pupils: Pupils are equal, round, and reactive to light.     Visual Fields: Right eye visual  fields normal and left eye visual fields normal.  Neck:     Thyroid: No thyromegaly.     Vascular: No carotid bruit.  Cardiovascular:     Rate and Rhythm: Normal rate and regular rhythm.     Heart sounds: Normal heart sounds. No murmur heard.   No gallop.  Pulmonary:     Effort: Pulmonary effort is normal. No accessory muscle usage or respiratory distress.     Breath sounds: Normal breath sounds.  Abdominal:     General: Bowel sounds are normal.     Palpations: Abdomen is soft. There is no hepatomegaly or splenomegaly.  Musculoskeletal:     Cervical back: Normal range of motion and neck supple.     Right lower leg: No edema.     Left lower leg: No edema.  Skin:    General: Skin is warm and dry.     Findings: Rash present. Rash is scaling.     Comments: To right lid, internal  aspect, two small round patches with mild erythema and slight scaling present.  To left eyelid small patch of mild erythema and to outer corner left eye small patch.  No vesicles noted.  Neurological:     Mental Status: She is alert and oriented to person, place, and time.  Psychiatric:        Attention and Perception: Attention normal.        Mood and Affect: Mood normal.        Speech: Speech normal.        Behavior: Behavior normal. Behavior is cooperative.        Thought Content: Thought content normal.    Results for orders placed or performed in visit on 08/30/20  Comp Met (CMET)  Result Value Ref Range   Glucose 83 65 - 99 mg/dL   BUN 9 6 - 20 mg/dL   Creatinine, Ser 0.76 0.57 - 1.00 mg/dL   eGFR 104 >59 mL/min/1.73   BUN/Creatinine Ratio 12 9 - 23   Sodium 140 134 - 144 mmol/L   Potassium 4.1 3.5 - 5.2 mmol/L   Chloride 102 96 - 106 mmol/L   CO2 23 20 - 29 mmol/L   Calcium 9.1 8.7 - 10.2 mg/dL   Total Protein 6.6 6.0 - 8.5 g/dL   Albumin 4.5 3.8 - 4.8 g/dL   Globulin, Total 2.1 1.5 - 4.5 g/dL   Albumin/Globulin Ratio 2.1 1.2 - 2.2   Bilirubin Total 0.6 0.0 - 1.2 mg/dL   Alkaline Phosphatase 88 44 - 121 IU/L   AST 14 0 - 40 IU/L   ALT 17 0 - 32 IU/L  TSH  Result Value Ref Range   TSH 1.830 0.450 - 4.500 uIU/mL  CBC w/Diff  Result Value Ref Range   WBC 4.8 3.4 - 10.8 x10E3/uL   RBC 4.75 3.77 - 5.28 x10E6/uL   Hemoglobin 14.7 11.1 - 15.9 g/dL   Hematocrit 43.2 34.0 - 46.6 %   MCV 91 79 - 97 fL   MCH 30.9 26.6 - 33.0 pg   MCHC 34.0 31.5 - 35.7 g/dL   RDW 12.2 11.7 - 15.4 %   Platelets 345 150 - 450 x10E3/uL   Neutrophils 45 Not Estab. %   Lymphs 45 Not Estab. %   Monocytes 6 Not Estab. %   Eos 3 Not Estab. %   Basos 1 Not Estab. %   Neutrophils Absolute 2.2 1.4 - 7.0 x10E3/uL   Lymphocytes Absolute 2.1 0.7 - 3.1 x10E3/uL  Monocytes Absolute 0.3 0.1 - 0.9 x10E3/uL   EOS (ABSOLUTE) 0.1 0.0 - 0.4 x10E3/uL   Basophils Absolute 0.0 0.0 - 0.2 x10E3/uL   Immature  Granulocytes 0 Not Estab. %   Immature Grans (Abs) 0.0 0.0 - 0.1 x10E3/uL  D-Dimer, Quantitative  Result Value Ref Range   D-DIMER <0.20 0.00 - 0.49 mg/L FEU      Assessment & Plan:   Problem List Items Addressed This Visit       Musculoskeletal and Integument   Atopic dermatitis of eyelid    To bilateral eyelids, R>L.  Educated patient on this.  Recommend she avoid all make-up at this time and use unscented soaps and hair products.  Start Zyrtec one tablet daily in morning, may take Benadryl at night if needed for comfort.  Script for Desown 0.05% cream sent in to apply scantly to eyelid area twice daily for two weeks only.  Will plan on follow-up in one week to assess, if worsening or ongoing consider Prednisone taper and referral to dermatology.  Return sooner if worsening.        Follow up plan: Return in about 1 week (around 01/24/2021) for Follow-up on rash virtually.

## 2021-01-17 NOTE — Assessment & Plan Note (Signed)
To bilateral eyelids, R>L.  Educated patient on this.  Recommend she avoid all make-up at this time and use unscented soaps and hair products.  Start Zyrtec one tablet daily in morning, may take Benadryl at night if needed for comfort.  Script for Desown 0.05% cream sent in to apply scantly to eyelid area twice daily for two weeks only.  Will plan on follow-up in one week to assess, if worsening or ongoing consider Prednisone taper and referral to dermatology.  Return sooner if worsening.

## 2021-01-23 DIAGNOSIS — Z23 Encounter for immunization: Secondary | ICD-10-CM | POA: Diagnosis not present

## 2021-01-25 ENCOUNTER — Telehealth: Payer: BC Managed Care – PPO | Admitting: Nurse Practitioner

## 2021-01-26 ENCOUNTER — Ambulatory Visit: Payer: Self-pay | Admitting: Neurology

## 2021-02-17 ENCOUNTER — Other Ambulatory Visit: Payer: Self-pay

## 2021-02-17 ENCOUNTER — Ambulatory Visit (INDEPENDENT_AMBULATORY_CARE_PROVIDER_SITE_OTHER): Payer: BC Managed Care – PPO | Admitting: Nurse Practitioner

## 2021-02-17 ENCOUNTER — Encounter: Payer: Self-pay | Admitting: Nurse Practitioner

## 2021-02-17 VITALS — BP 118/76 | HR 69 | Temp 99.1°F | Wt 240.8 lb

## 2021-02-17 DIAGNOSIS — Z79899 Other long term (current) drug therapy: Secondary | ICD-10-CM

## 2021-02-17 DIAGNOSIS — F902 Attention-deficit hyperactivity disorder, combined type: Secondary | ICD-10-CM | POA: Diagnosis not present

## 2021-02-17 DIAGNOSIS — F909 Attention-deficit hyperactivity disorder, unspecified type: Secondary | ICD-10-CM | POA: Insufficient documentation

## 2021-02-17 MED ORDER — AMPHETAMINE-DEXTROAMPHET ER 20 MG PO CP24: 20.0000 mg | ORAL_CAPSULE | ORAL | 0 refills | Status: DC

## 2021-02-17 NOTE — Assessment & Plan Note (Signed)
Ongoing, has taken medication multiple times in past, starting in childhood.  Will restart medication at this time, Adderall XR 20 MG daily (she often does not take on vacation or weekends), to assist her with returning to school and working full-time.  In past did not tolerate Ritalin or Concerta per her report, will avoid these.  UDS obtained today and contract signed.  She is aware of need for every 3 month visits and yearly UDS, agrees with this plan.

## 2021-02-17 NOTE — Patient Instructions (Signed)
Living With Attention Deficit Hyperactivity Disorder If you have been diagnosed with attention deficit hyperactivity disorder (ADHD), you may be relieved that you now know why you have felt or behaved a certain way. Still, you may feel overwhelmed about the treatment ahead. You may also wonder how to get the support you need and how to deal with the condition day-to-day. With treatment and support, you can live with ADHD and manage your symptoms. How to manage lifestyle changes Managing stress Stress is your body's reaction to life changes and events, both good and bad. To cope with the stress of an ADHD diagnosis, it may help to: Learn more about ADHD. Exercise regularly. Even a short daily walk can lower stress levels. Participate in training or education programs (including social skills training classes) that teach you to deal with symptoms.  Medicines Your health care provider may suggest certain medicines if he or she feels that they will help to improve your condition. Stimulant medicines are usually prescribed to treat ADHD, and therapy may also be prescribed. It is important to: Avoid using alcohol and other substances that may prevent your medicines from working properly (may interact). Talk with your pharmacist or health care provider about all the medicines that you take, their possible side effects, and what medicines are safe to take together. Make it your goal to take part in all treatment decisions (shared decision-making). Ask about possible side effects of medicines that your health care provider recommends, and tell him or her how you feel about having those side effects. It is best if shared decision-making with your health care provider is part of your total treatment plan. Relationships To strengthen your relationships with family members while treating your condition, consider taking part in family therapy. You might also attend self-help groups alone or with a loved one. Be  honest about how your symptoms affect your relationships. Make an effort to communicate respectfully instead of fighting, and find ways to show others that you care. Psychotherapy may be useful in helping you cope with how ADHD affects your relationships. How to recognize changes in your condition The following signs may mean that your treatment is working well and your condition is improving: Consistently being on time for appointments. Being more organized at home and work. Other people noticing improvements in your behavior. Achieving goals that you set for yourself. Thinking more clearly. The following signs may mean that your treatment is not working very well: Feeling impatience or more confusion. Missing, forgetting, or being late for appointments. An increasing sense of disorganization and messiness. More difficulty in reaching goals that you set for yourself. Loved ones becoming angry or frustrated with you. Follow these instructions at home: Take over-the-counter and prescription medicines only as told by your health care provider. Check with your health care provider before taking any new medicines. Create structure and an organized atmosphere at home. For example: Make a list of tasks, then rank them from most important to least important. Work on one task at a time until your listed tasks are done. Make a daily schedule and follow it consistently every day. Use an appointment calendar, and check it 2 or 3 times a day to keep on track. Keep it with you when you leave the house. Create spaces where you keep certain things, and always put things back in their places after you use them. Keep all follow-up visits as told by your health care provider. This is important. Where to find support Talking to others    Keep emotion out of important discussions and speak in a calm, logical way. Listen closely and patiently to your loved ones. Try to understand their point of view, and try to  avoid getting defensive. Take responsibility for the consequences of your actions. Ask that others do not take your behaviors personally. Aim to solve problems as they come up, and express your feelings instead of bottling them up. Talk openly about what you need from your loved ones and how they can support you. Consider going to family therapy sessions or having your family meet with a specialist who deals with ADHD-related behavior problems. Finances Not all insurance plans cover mental health care, so it is important to check with your insurance carrier. If paying for co-pays or counseling services is a problem, search for a local or county mental health care center. Public mental health care services may be offered there at a low cost or no cost when you are not able to see a private health care provider. If you are taking medicine for ADHD, you may be able to get the generic form, which may be less expensive than brand-name medicine. Some makers of prescription medicines also offer help to patients who cannot afford the medicines that they need. Questions to ask your health care provider: What are the risks and benefits of taking medicines? Would I benefit from therapy? How often should I follow up with a health care provider? Contact a health care provider if: You have side effects from your medicines, such as: Repeated muscle twitches, coughing, or speech outbursts. Sleep problems. Loss of appetite. Depression. New or worsening behavior problems. Dizziness. Unusually fast heartbeat. Stomach pains. Headaches. Get help right away if: You have a severe reaction to a medicine. Your behavior suddenly gets worse. Summary With treatment and support, you can live with ADHD and manage your symptoms. The medicines that are most often prescribed for ADHD are stimulants. Consider taking part in family therapy or self-help groups with family members or friends. When you talk with friends  and family about your ADHD, be patient and communicate openly. Take over-the-counter and prescription medicines only as told by your health care provider. Check with your health care provider before taking any new medicines. This information is not intended to replace advice given to you by your health care provider. Make sure you discuss any questions you have with your health care provider. Document Revised: 09/09/2019 Document Reviewed: 09/09/2019 Elsevier Patient Education  2022 [employer] Inc[employer]  

## 2021-02-17 NOTE — Progress Notes (Signed)
BP 118/76   Pulse 69   Temp 99.1 F (37.3 C) (Oral)   Wt 240 lb 12.8 oz (109.2 kg)   SpO2 98%   BMI 44.04 kg/m    Subjective:    Patient ID: [name], female    DOB: [birth year], 35 y.o.   MRN: [id number]  HPI: [name] is a 35 y.o. female  Chief Complaint  Patient presents with   Medication Consultation    Patient states she is here to discuss restarting medication for her ADHD. 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 Patient states she would like to discuss with provider.    ADHD  Presents today to discuss restarting medication for ADHD.  She struggled with this when younger and took medication, came off of it when a bit older, and then went back on it during college years.  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  In past she took Ritalin which made her like a zombie + Concerta.  Adderall was the best for her in past -- she would not take it on weekends and or vacation.   ADHD status: uncontrolled Satisfied with current therapy: no Controlled substance contract: yes Previous psychiatry evaluation: yes Previous medications: yes adderall, concerta, focalin (dexamethylphenidate)". "ritalin, and ritalin SR   Taking meds on weekends/vacations: no Work/school performance:  average Difficulty sustaining attention/completing tasks: yes Distracted by extraneous stimuli: yes Does not listen when spoken to: no  Fidgets with hands or feet: yes Unable to stay in seat: yes Blurts out/interrupts others: no Adult ADHD Self Report Scale (most recent)     Adult ADHD Self-Report Scale (ASRS-v1.1) Symptom Checklist - 02/17/21 0923       Part A   1. How often do you have trouble wrapping up the final details of a project, once the challenging parts have been done? Sometimes  2. How often do you have difficulty getting  things done in order when you have to do a task that requires organization? Sometimes    3. How often do you have problems remembering appointments or obligations? Rarely  4. When you have a task that requires a lot of thought, how often do you avoid or delay getting started? Rarely    5. How often do you fidget or squirm with your hands or feet when you have to sit down for a long time? Very Often  6. How often do you feel overly active and compelled to do things, like you were driven by a motor? Rarely      Part B   7. How often do you make careless mistakes when you have to work on a boring or difficult project? Rarely  8. How often do you have difficulty keeping your attention when you are doing boring or repetitive work? Sometimes    9. How often do you have difficulty concentrating on what people say to you, even when they are speaking to you directly? Rarely  10. How often do you misplace or have difficulty finding things at home or at work? Rarely    11. How often are you distracted by activity or noise around you? Very Often  17. How often do you leave your seat in meetings or other situations in which you are expected to remain seated? Never    13. How often do you feel restless  or fidgety? Very Often  49. How often do you have difficulty unwinding and relaxing when you have time to yourself? Sometimes    15. How often do you find yourself talking too much when you are in social situations? Never  16. When you are in a conversation, how often do you find yourself finishing the sentences of the people you are talking to, before they can finish them themselves? Never    17. How often do you have difficulty waiting your turn in situations when turn taking is required? Never  18. How often do you interrupt others when they are busy? Never      Comment   How old were you when these problems first began to occur? 6               Relevant past medical, surgical, family and social history  reviewed and updated as indicated. Interim medical history since our last visit reviewed. Allergies and medications reviewed and updated.  Review of Systems  Constitutional:  Negative for activity change, appetite change, diaphoresis, fatigue and fever.  Respiratory:  Negative for cough, chest tightness and shortness of breath.   Cardiovascular:  Negative for chest pain, palpitations and leg swelling.  Gastrointestinal: Negative.   Neurological: Negative.   Psychiatric/Behavioral: Negative.     Per HPI unless specifically indicated above     Objective:    BP 118/76   Pulse 69   Temp 99.1 F (37.3 C) (Oral)   Wt 240 lb 12.8 oz (109.2 kg)   SpO2 98%   BMI 44.04 kg/m   Wt Readings from Last 3 Encounters:  02/17/21 240 lb 12.8 oz (109.2 kg)  01/17/21 238 lb 12.8 oz (108.3 kg)  09/28/20 230 lb 5 oz (104.5 kg)    Physical Exam Vitals and nursing note reviewed.  Constitutional:      General: She is awake. She is not in acute distress.    Appearance: She is well-developed and well-groomed. She is morbidly obese. She is not ill-appearing or toxic-appearing.  HENT:     Head: Normocephalic.     Right Ear: Hearing normal.     Left Ear: Hearing normal.     Nose: Nose normal.     Mouth/Throat:     Mouth: Mucous membranes are moist.  Eyes:     General: Lids are normal.        Right eye: No discharge.        Left eye: No discharge.     Conjunctiva/sclera: Conjunctivae normal.     Pupils: Pupils are equal, round, and reactive to light.  Neck:     Thyroid: No thyromegaly.     Vascular: No carotid bruit or JVD.  Cardiovascular:     Rate and Rhythm: Normal rate and regular rhythm.     Heart sounds: Normal heart sounds. No murmur heard.   No gallop.  Pulmonary:     Effort: Pulmonary effort is normal.     Breath sounds: Normal breath sounds.  Abdominal:     General: Bowel sounds are normal.     Palpations: Abdomen is soft. There is no hepatomegaly or splenomegaly.   Musculoskeletal:     Cervical back: Normal range of motion and neck supple.     Right lower leg: No edema.     Left lower leg: No edema.  Lymphadenopathy:     Cervical: No cervical adenopathy.  Skin:    General: Skin is warm and dry.  Neurological:  Mental Status: She is alert and oriented to person, place, and time.  Psychiatric:        Attention and Perception: Attention normal.        Mood and Affect: Mood normal.        Behavior: Behavior normal. Behavior is cooperative.        Thought Content: Thought content normal.        Judgment: Judgment normal.   Results for orders placed or performed in visit on 08/30/20  Comp Met (CMET)  Result Value Ref Range   Glucose 83 65 - 99 mg/dL   BUN 9 6 - 20 mg/dL   Creatinine, Ser 0.76 0.57 - 1.00 mg/dL   eGFR 104 >59 mL/min/1.73   BUN/Creatinine Ratio 12 9 - 23   Sodium 140 134 - 144 mmol/L   Potassium 4.1 3.5 - 5.2 mmol/L   Chloride 102 96 - 106 mmol/L   CO2 23 20 - 29 mmol/L   Calcium 9.1 8.7 - 10.2 mg/dL   Total Protein 6.6 6.0 - 8.5 g/dL   Albumin 4.5 3.8 - 4.8 g/dL   Globulin, Total 2.1 1.5 - 4.5 g/dL   Albumin/Globulin Ratio 2.1 1.2 - 2.2   Bilirubin Total 0.6 0.0 - 1.2 mg/dL   Alkaline Phosphatase 88 44 - 121 IU/L   AST 14 0 - 40 IU/L   ALT 17 0 - 32 IU/L  TSH  Result Value Ref Range   TSH 1.830 0.450 - 4.500 uIU/mL  CBC w/Diff  Result Value Ref Range   WBC 4.8 3.4 - 10.8 x10E3/uL   RBC 4.75 3.77 - 5.28 x10E6/uL   Hemoglobin 14.7 11.1 - 15.9 g/dL   Hematocrit 43.2 34.0 - 46.6 %   MCV 91 79 - 97 fL   MCH 30.9 26.6 - 33.0 pg   MCHC 34.0 31.5 - 35.7 g/dL   RDW 12.2 11.7 - 15.4 %   Platelets 345 150 - 450 x10E3/uL   Neutrophils 45 Not Estab. %   Lymphs 45 Not Estab. %   Monocytes 6 Not Estab. %   Eos 3 Not Estab. %   Basos 1 Not Estab. %   Neutrophils Absolute 2.2 1.4 - 7.0 x10E3/uL   Lymphocytes Absolute 2.1 0.7 - 3.1 x10E3/uL   Monocytes Absolute 0.3 0.1 - 0.9 x10E3/uL   EOS (ABSOLUTE) 0.1 0.0 - 0.4  x10E3/uL   Basophils Absolute 0.0 0.0 - 0.2 x10E3/uL   Immature Granulocytes 0 Not Estab. %   Immature Grans (Abs) 0.0 0.0 - 0.1 x10E3/uL  D-Dimer, Quantitative  Result Value Ref Range   D-DIMER <0.20 0.00 - 0.49 mg/L FEU      Assessment & Plan:   Problem List Items Addressed This Visit       Other   ADHD (attention deficit hyperactivity disorder) - Primary    Ongoing, has taken medication multiple times in past, starting in childhood.  Will restart medication at this time, Adderall XR 20 MG daily (she often does not take on vacation or weekends), to assist her with returning to school and working full-time.  In past did not tolerate Ritalin or Concerta per her report, will avoid these.  UDS obtained today and contract signed.  She is aware of need for every 3 month visits and yearly UDS, agrees with this plan.        Other Visit Diagnoses     High risk medication use       UDS today.   Relevant Orders  [id number] 11+Oxyco+Alc+Crt-Bund        Follow up plan: Return in about 3 months (around 05/20/2021) for ADHD.

## 2021-02-18 LAB — DRUG SCREEN 764883 11+OXYCO+ALC+CRT-BUND
Amphetamines, Urine: NEGATIVE ng/mL
BENZODIAZ UR QL: NEGATIVE ng/mL
Barbiturate: NEGATIVE ng/mL
Cannabinoid Quant, Ur: NEGATIVE ng/mL
Cocaine (Metabolite): NEGATIVE ng/mL
Creatinine: 244.2 mg/dL (ref 20.0–300.0)
Ethanol: NEGATIVE %
Meperidine: NEGATIVE ng/mL
Methadone Screen, Urine: NEGATIVE ng/mL
OPIATE SCREEN URINE: NEGATIVE ng/mL
Oxycodone/Oxymorphone, Urine: NEGATIVE ng/mL
Phencyclidine: NEGATIVE ng/mL
Propoxyphene: NEGATIVE ng/mL
Tramadol: NEGATIVE ng/mL
pH, Urine: 6.1 (ref 4.5–8.9)

## 2021-02-20 ENCOUNTER — Encounter: Payer: Self-pay | Admitting: Nurse Practitioner

## 2021-02-21 MED ORDER — AMPHETAMINE-DEXTROAMPHET ER 20 MG PO CP24: 20.0000 mg | ORAL_CAPSULE | ORAL | 0 refills | Status: DC

## 2021-02-21 NOTE — Addendum Note (Signed)
Addended by: Aura Dials T on: 02/21/2021 08:05 AM   Modules accepted: Orders

## 2021-02-23 ENCOUNTER — Telehealth: Payer: Self-pay

## 2021-02-23 NOTE — Telephone Encounter (Signed)
Prior authorization was initiated for prescription Adderrall. Awaiting on determination via CoverMyMeds.   KEY: [id number]

## 2021-03-13 ENCOUNTER — Ambulatory Visit: Payer: BC Managed Care – PPO | Admitting: Nurse Practitioner

## 2021-03-23 ENCOUNTER — Other Ambulatory Visit: Payer: Self-pay | Admitting: Nurse Practitioner

## 2021-04-06 ENCOUNTER — Telehealth: Payer: BC Managed Care – PPO | Admitting: Physician Assistant

## 2021-04-06 DIAGNOSIS — R3989 Other symptoms and signs involving the genitourinary system: Secondary | ICD-10-CM | POA: Diagnosis not present

## 2021-04-06 MED ORDER — NITROFURANTOIN MONOHYD MACRO 100 MG PO CAPS: 100.0000 mg | ORAL_CAPSULE | Freq: Two times a day (BID) | ORAL | 0 refills | Status: DC

## 2021-04-06 NOTE — Progress Notes (Signed)
I have spent 5 minutes in review of e-visit questionnaire, review and updating patient chart, medical decision making and response to patient.   Jacen Carlini Cody Alnisa Hasley, PA-C    

## 2021-04-06 NOTE — Progress Notes (Signed)

## 2021-04-20 ENCOUNTER — Encounter: Payer: Self-pay | Admitting: Nurse Practitioner

## 2021-04-20 ENCOUNTER — Other Ambulatory Visit: Payer: Self-pay | Admitting: Nurse Practitioner

## 2021-04-20 MED ORDER — VALACYCLOVIR HCL 1 G PO TABS: ORAL_TABLET | ORAL | 8 refills | Status: AC

## 2021-04-20 MED ORDER — AMPHETAMINE-DEXTROAMPHET ER 20 MG PO CP24: 20.0000 mg | ORAL_CAPSULE | ORAL | 0 refills | Status: DC

## 2021-05-14 DIAGNOSIS — E559 Vitamin D deficiency, unspecified: Secondary | ICD-10-CM | POA: Insufficient documentation

## 2021-05-14 NOTE — Patient Instructions (Signed)
Living With Attention Deficit Hyperactivity Disorder If you have been diagnosed with attention deficit hyperactivity disorder (ADHD), you may be relieved that you now know why you have felt or behaved a certain way. Still, you may feel overwhelmed about the treatment ahead. You may also wonder how to get the support you need and how to deal with the condition day-to-day. With treatment and support, you can live with ADHD and manage your symptoms. How to manage lifestyle changes Managing stress Stress is your body's reaction to life changes and events, both good and bad. To cope with the stress of an ADHD diagnosis, it may help to: Learn more about ADHD. Exercise regularly. Even a short daily walk can lower stress levels. Participate in training or education programs (including social skills training classes) that teach you to deal with symptoms.  Medicines Your health care provider may suggest certain medicines if he or she feels that they will help to improve your condition. Stimulant medicines are usually prescribed to treat ADHD, and therapy may also be prescribed. It is important to: Avoid using alcohol and other substances that may prevent your medicines from working properly (may interact). Talk with your pharmacist or health care provider about all the medicines that you take, their possible side effects, and what medicines are safe to take together. Make it your goal to take part in all treatment decisions (shared decision-making). Ask about possible side effects of medicines that your health care provider recommends, and tell him or her how you feel about having those side effects. It is best if shared decision-making with your health care provider is part of your total treatment plan. Relationships To strengthen your relationships with family members while treating your condition, consider taking part in family therapy. You might also attend self-help groups alone or with a loved one. Be  honest about how your symptoms affect your relationships. Make an effort to communicate respectfully instead of fighting, and find ways to show others that you care. Psychotherapy may be useful in helping you cope with how ADHD affects your relationships. How to recognize changes in your condition The following signs may mean that your treatment is working well and your condition is improving: Consistently being on time for appointments. Being more organized at home and work. Other people noticing improvements in your behavior. Achieving goals that you set for yourself. Thinking more clearly. The following signs may mean that your treatment is not working very well: Feeling impatience or more confusion. Missing, forgetting, or being late for appointments. An increasing sense of disorganization and messiness. More difficulty in reaching goals that you set for yourself. Loved ones becoming angry or frustrated with you. Follow these instructions at home: Take over-the-counter and prescription medicines only as told by your health care provider. Check with your health care provider before taking any new medicines. Create structure and an organized atmosphere at home. For example: Make a list of tasks, then rank them from most important to least important. Work on one task at a time until your listed tasks are done. Make a daily schedule and follow it consistently every day. Use an appointment calendar, and check it 2 or 3 times a day to keep on track. Keep it with you when you leave the house. Create spaces where you keep certain things, and always put things back in their places after you use them. Keep all follow-up visits as told by your health care provider. This is important. Where to find support Talking to others    Keep emotion out of important discussions and speak in a calm, logical way. Listen closely and patiently to your loved ones. Try to understand their point of view, and try to  avoid getting defensive. Take responsibility for the consequences of your actions. Ask that others do not take your behaviors personally. Aim to solve problems as they come up, and express your feelings instead of bottling them up. Talk openly about what you need from your loved ones and how they can support you. Consider going to family therapy sessions or having your family meet with a specialist who deals with ADHD-related behavior problems. Finances Not all insurance plans cover mental health care, so it is important to check with your insurance carrier. If paying for co-pays or counseling services is a problem, search for a local or county mental health care center. Public mental health care services may be offered there at a low cost or no cost when you are not able to see a private health care provider. If you are taking medicine for ADHD, you may be able to get the generic form, which may be less expensive than brand-name medicine. Some makers of prescription medicines also offer help to patients who cannot afford the medicines that they need. Questions to ask your health care provider: What are the risks and benefits of taking medicines? Would I benefit from therapy? How often should I follow up with a health care provider? Contact a health care provider if: You have side effects from your medicines, such as: Repeated muscle twitches, coughing, or speech outbursts. Sleep problems. Loss of appetite. Depression. New or worsening behavior problems. Dizziness. Unusually fast heartbeat. Stomach pains. Headaches. Get help right away if: You have a severe reaction to a medicine. Your behavior suddenly gets worse. Summary With treatment and support, you can live with ADHD and manage your symptoms. The medicines that are most often prescribed for ADHD are stimulants. Consider taking part in family therapy or self-help groups with family members or friends. When you talk with friends  and family about your ADHD, be patient and communicate openly. Take over-the-counter and prescription medicines only as told by your health care provider. Check with your health care provider before taking any new medicines. This information is not intended to replace advice given to you by your health care provider. Make sure you discuss any questions you have with your health care provider. Document Revised: 09/09/2019 Document Reviewed: 09/09/2019 Elsevier Patient Education  2022 [employer] Inc[employer]  

## 2021-05-16 DIAGNOSIS — S0501XA Injury of conjunctiva and corneal abrasion without foreign body, right eye, initial encounter: Secondary | ICD-10-CM | POA: Diagnosis not present

## 2021-05-18 ENCOUNTER — Other Ambulatory Visit: Payer: Self-pay

## 2021-05-18 ENCOUNTER — Encounter: Payer: Self-pay | Admitting: Nurse Practitioner

## 2021-05-18 ENCOUNTER — Ambulatory Visit (INDEPENDENT_AMBULATORY_CARE_PROVIDER_SITE_OTHER): Payer: Self-pay | Admitting: Nurse Practitioner

## 2021-05-18 VITALS — BP 122/81 | HR 89 | Temp 98.4°F | Ht 62.0 in | Wt 224.8 lb

## 2021-05-18 DIAGNOSIS — Z6841 Body Mass Index (BMI) 40.0 and over, adult: Secondary | ICD-10-CM

## 2021-05-18 DIAGNOSIS — G4733 Obstructive sleep apnea (adult) (pediatric): Secondary | ICD-10-CM

## 2021-05-18 DIAGNOSIS — F902 Attention-deficit hyperactivity disorder, combined type: Secondary | ICD-10-CM

## 2021-05-18 MED ORDER — AMPHETAMINE-DEXTROAMPHET ER 20 MG PO CP24: 20.0000 mg | ORAL_CAPSULE | ORAL | 0 refills | Status: DC

## 2021-05-18 NOTE — Assessment & Plan Note (Signed)
Ongoing, has taken medication multiple times in past, starting in childhood.  Will continue Adderall XR 20 MG daily, to assist her with school and working full-time.  In past did not tolerate Ritalin or Concerta per her report, will avoid these.  UDS up to date and contract.  She is aware of need for every 3 month visits and yearly UDS, agrees with this plan.

## 2021-05-18 NOTE — Assessment & Plan Note (Signed)
Diagnosed in July 2022 -- did not obtain CPAP and prefers not to at this time, she is aware of the risks of sleep apnea and benefit from treatment.

## 2021-05-18 NOTE — Assessment & Plan Note (Signed)
BMI 41.12, has lost 18 pounds thus far and is taking Ozempic obtain via weight loss program online.  Recommended eating smaller high protein, low fat meals more frequently and exercising 30 mins a day 5 times a week with a goal of 10-15lb weight loss in the next 3 months. Patient voiced their understanding and motivation to adhere to these recommendations.

## 2021-05-18 NOTE — Progress Notes (Signed)
BP 122/81    Pulse 89    Temp 98.4 F (36.9 C) (Oral)    Ht 5\' 2"  (1.575 m)    Wt 224 lb 12.8 oz (102 kg)    SpO2 97%    BMI 41.12 kg/m    Subjective:    Patient ID: , female    DOB: 10/12/1985, 36 y.o.   MRN: [id number]  HPI: [name] is a 36 y.o. female  Chief Complaint  Patient presents with   ADHD    Patient is here to follow up on ADHD. Patient states she is doing well at today's visit.    ADHD FOLLOW UP Continues on Adderall daily which is offering benefit -- to mood and focus at work and school.  PDMP last fill was 04/20/21. ADHD status: controlled Satisfied with current therapy: yes Medication compliance:  good compliance Controlled substance contract: yes Previous psychiatry evaluation: no Previous medications: yes adderall and adderall XR   Taking meds on weekends/vacations: yes Work/school performance:  good Difficulty sustaining attention/completing tasks: no Distracted by extraneous stimuli: no Does not listen when spoken to: no  Fidgets with hands or feet: no Unable to stay in seat: no Blurts out/interrupts others: no ADHD Medication Side Effects: no    Decreased appetite: no    Headache: no    Sleeping disturbance pattern: no    Irritability: no    Rebound effects (worse than baseline) off medication: no    Anxiousness: no    Dizziness: no    Tics: no   WEIGHT LOSS Is taking Ozempic to assist with this and took second 0.25 MG injection on Tuesday -- is tolerating this well.  Has lost 16 pounds at this time with diet changes -- soda cut out.  Is trying to fit in time to exercise.  She had a sleep study 11/03/20 with OSA noted -- she does have CPAP as is fearful of mask.  Is obtaining Ozempic via [employer] online for weight loss.  Relevant past medical, surgical, family and social history reviewed and updated as indicated. Interim medical history since our last visit reviewed. Allergies and medications reviewed and  updated.  Review of Systems  Constitutional:  Negative for activity change, appetite change, diaphoresis, fatigue and fever.  Respiratory:  Negative for cough, chest tightness and shortness of breath.   Cardiovascular:  Negative for chest pain, palpitations and leg swelling.  Gastrointestinal: Negative.   Neurological: Negative.   Psychiatric/Behavioral: Negative.     Per HPI unless specifically indicated above     Objective:    BP 122/81    Pulse 89    Temp 98.4 F (36.9 C) (Oral)    Ht 5\' 2"  (1.575 m)    Wt 224 lb 12.8 oz (102 kg)    SpO2 97%    BMI 41.12 kg/m   Wt Readings from Last 3 Encounters:  05/18/21 224 lb 12.8 oz (102 kg)  02/17/21 240 lb 12.8 oz (109.2 kg)  01/17/21 238 lb 12.8 oz (108.3 kg)    Physical Exam Vitals and nursing note reviewed.  Constitutional:      General: She is awake. She is not in acute distress.    Appearance: She is well-developed and well-groomed. She is morbidly obese. She is not ill-appearing or toxic-appearing.  HENT:     Head: Normocephalic.     Right Ear: Hearing normal.     Left Ear: Hearing normal.     Nose: Nose normal.  Mouth/Throat:     Mouth: Mucous membranes are moist.  Eyes:     General: Lids are normal.        Right eye: No discharge.        Left eye: No discharge.     Conjunctiva/sclera: Conjunctivae normal.     Pupils: Pupils are equal, round, and reactive to light.  Neck:     Thyroid: No thyromegaly.     Vascular: No carotid bruit or JVD.  Cardiovascular:     Rate and Rhythm: Normal rate and regular rhythm.     Heart sounds: Normal heart sounds. No murmur heard.   No gallop.  Pulmonary:     Effort: Pulmonary effort is normal.     Breath sounds: Normal breath sounds.  Abdominal:     General: Bowel sounds are normal.     Palpations: Abdomen is soft.  Musculoskeletal:     Cervical back: Normal range of motion and neck supple.     Right lower leg: No edema.     Left lower leg: No edema.  Lymphadenopathy:      Cervical: No cervical adenopathy.  Skin:    General: Skin is warm and dry.  Neurological:     Mental Status: She is alert and oriented to person, place, and time.  Psychiatric:        Attention and Perception: Attention normal.        Mood and Affect: Mood normal.        Behavior: Behavior normal. Behavior is cooperative.        Thought Content: Thought content normal.        Judgment: Judgment normal.    Results for orders placed or performed in visit on 02/17/21  [id number] 11+Oxyco+Alc+Crt-Bund  Result Value Ref Range   Ethanol Negative Cutoff=0.020 %   Amphetamines, Urine Negative Cutoff=1000 ng/mL   Barbiturate Negative Cutoff=200 ng/mL   BENZODIAZ UR QL Negative Cutoff=200 ng/mL   Cannabinoid Quant, Ur Negative Cutoff=50 ng/mL   Cocaine (Metabolite) Negative Cutoff=300 ng/mL   OPIATE SCREEN URINE Negative Cutoff=300 ng/mL   Oxycodone/Oxymorphone, Urine Negative Cutoff=300 ng/mL   Phencyclidine Negative Cutoff=25 ng/mL   Methadone Screen, Urine Negative Cutoff=300 ng/mL   Propoxyphene Negative Cutoff=300 ng/mL   Meperidine Negative Cutoff=200 ng/mL   Tramadol Negative Cutoff=200 ng/mL   Creatinine 244.2 20.0 - 300.0 mg/dL   pH, Urine 6.1 4.5 - 8.9      Assessment & Plan:   Problem List Items Addressed This Visit       Respiratory   Obstructive sleep apnea of adult    Diagnosed in July 2022 -- did not obtain CPAP and prefers not to at this time, she is aware of the risks of sleep apnea and benefit from treatment.        Other   ADHD (attention deficit hyperactivity disorder) - Primary    Ongoing, has taken medication multiple times in past, starting in childhood.  Will continue Adderall XR 20 MG daily, to assist her with school and working full-time.  In past did not tolerate Ritalin or Concerta per her report, will avoid these.  UDS up to date and contract.  She is aware of need for every 3 month visits and yearly UDS, agrees with this plan.        Relevant  Medications   amphetamine-dextroamphetamine (ADDERALL XR) 20 MG 24 hr capsule (Start on 05/20/2021)   amphetamine-dextroamphetamine (ADDERALL XR) 20 MG 24 hr capsule (Start on 06/17/2021)   amphetamine-dextroamphetamine (ADDERALL XR)  20 MG 24 hr capsule (Start on 07/18/2021)   Obesity    BMI 41.12, has lost 18 pounds thus far and is taking Ozempic obtain via weight loss program online.  Recommended eating smaller high protein, low fat meals more frequently and exercising 30 mins a day 5 times a week with a goal of 10-15lb weight loss in the next 3 months. Patient voiced their understanding and motivation to adhere to these recommendations.         Relevant Medications   OZEMPIC, 0.25 OR 0.5 MG/DOSE, 2 MG/1.5ML SOPN   amphetamine-dextroamphetamine (ADDERALL XR) 20 MG 24 hr capsule (Start on 05/20/2021)   amphetamine-dextroamphetamine (ADDERALL XR) 20 MG 24 hr capsule (Start on 06/17/2021)   amphetamine-dextroamphetamine (ADDERALL XR) 20 MG 24 hr capsule (Start on 07/18/2021)     Follow up plan: Return in about 3 months (around 08/15/2021) for ADHD.

## 2021-05-22 ENCOUNTER — Encounter: Payer: Self-pay | Admitting: Nurse Practitioner

## 2021-05-22 DIAGNOSIS — F902 Attention-deficit hyperactivity disorder, combined type: Secondary | ICD-10-CM

## 2021-05-22 MED ORDER — AMPHETAMINE-DEXTROAMPHET ER 20 MG PO CP24: 20.0000 mg | ORAL_CAPSULE | ORAL | 0 refills | Status: DC

## 2021-06-07 ENCOUNTER — Encounter: Payer: Self-pay | Admitting: Nurse Practitioner

## 2021-06-28 ENCOUNTER — Encounter: Payer: Self-pay | Admitting: Nurse Practitioner

## 2021-06-28 MED ORDER — AMPHETAMINE-DEXTROAMPHET ER 10 MG PO CP24: 20.0000 mg | ORAL_CAPSULE | Freq: Every day | ORAL | 0 refills | Status: DC

## 2021-07-02 NOTE — Patient Instructions (Signed)
Living With Attention Deficit Hyperactivity Disorder If you have been diagnosed with attention deficit hyperactivity disorder (ADHD), you may be relieved that you now know why you have felt or behaved a certain way. Still, you may feel overwhelmed about the treatment ahead. You may also wonder how to get the support you need and how to deal with the condition day-to-day. With treatment and support, you can live with ADHD and manage your symptoms. How to manage lifestyle changes Managing stress Stress is your body's reaction to life changes and events, both good and bad. To cope with the stress of an ADHD diagnosis, it may help to: Learn more about ADHD. Exercise regularly. Even a short daily walk can lower stress levels. Participate in training or education programs (including social skills training classes) that teach you to deal with symptoms.  Medicines Your health care provider may suggest certain medicines if he or she feels that they will help to improve your condition. Stimulant medicines are usually prescribed to treat ADHD, and therapy may also be prescribed. It is important to: Avoid using alcohol and other substances that may prevent your medicines from working properly (may interact). Talk with your pharmacist or health care provider about all the medicines that you take, their possible side effects, and what medicines are safe to take together. Make it your goal to take part in all treatment decisions (shared decision-making). Ask about possible side effects of medicines that your health care provider recommends, and tell him or her how you feel about having those side effects. It is best if shared decision-making with your health care provider is part of your total treatment plan. Relationships To strengthen your relationships with family members while treating your condition, consider taking part in family therapy. You might also attend self-help groups alone or with a loved one. Be  honest about how your symptoms affect your relationships. Make an effort to communicate respectfully instead of fighting, and find ways to show others that you care. Psychotherapy may be useful in helping you cope with how ADHD affects your relationships. How to recognize changes in your condition The following signs may mean that your treatment is working well and your condition is improving: Consistently being on time for appointments. Being more organized at home and work. Other people noticing improvements in your behavior. Achieving goals that you set for yourself. Thinking more clearly. The following signs may mean that your treatment is not working very well: Feeling impatience or more confusion. Missing, forgetting, or being late for appointments. An increasing sense of disorganization and messiness. More difficulty in reaching goals that you set for yourself. Loved ones becoming angry or frustrated with you. Follow these instructions at home: Take over-the-counter and prescription medicines only as told by your health care provider. Check with your health care provider before taking any new medicines. Create structure and an organized atmosphere at home. For example: Make a list of tasks, then rank them from most important to least important. Work on one task at a time until your listed tasks are done. Make a daily schedule and follow it consistently every day. Use an appointment calendar, and check it 2 or 3 times a day to keep on track. Keep it with you when you leave the house. Create spaces where you keep certain things, and always put things back in their places after you use them. Keep all follow-up visits as told by your health care provider. This is important. Where to find support Talking to others    Keep emotion out of important discussions and speak in a calm, logical way. Listen closely and patiently to your loved ones. Try to understand their point of view, and try to  avoid getting defensive. Take responsibility for the consequences of your actions. Ask that others do not take your behaviors personally. Aim to solve problems as they come up, and express your feelings instead of bottling them up. Talk openly about what you need from your loved ones and how they can support you. Consider going to family therapy sessions or having your family meet with a specialist who deals with ADHD-related behavior problems. Finances Not all insurance plans cover mental health care, so it is important to check with your insurance carrier. If paying for co-pays or counseling services is a problem, search for a local or county mental health care center. Public mental health care services may be offered there at a low cost or no cost when you are not able to see a private health care provider. If you are taking medicine for ADHD, you may be able to get the generic form, which may be less expensive than brand-name medicine. Some makers of prescription medicines also offer help to patients who cannot afford the medicines that they need. Questions to ask your health care provider: What are the risks and benefits of taking medicines? Would I benefit from therapy? How often should I follow up with a health care provider? Contact a health care provider if: You have side effects from your medicines, such as: Repeated muscle twitches, coughing, or speech outbursts. Sleep problems. Loss of appetite. Depression. New or worsening behavior problems. Dizziness. Unusually fast heartbeat. Stomach pains. Headaches. Get help right away if: You have a severe reaction to a medicine. Your behavior suddenly gets worse. Summary With treatment and support, you can live with ADHD and manage your symptoms. The medicines that are most often prescribed for ADHD are stimulants. Consider taking part in family therapy or self-help groups with family members or friends. When you talk with friends  and family about your ADHD, be patient and communicate openly. Take over-the-counter and prescription medicines only as told by your health care provider. Check with your health care provider before taking any new medicines. This information is not intended to replace advice given to you by your health care provider. Make sure you discuss any questions you have with your health care provider. Document Revised: 09/09/2019 Document Reviewed: 09/09/2019 Elsevier Patient Education  2022 [employer] Inc[employer]  

## 2021-07-04 ENCOUNTER — Encounter: Payer: Self-pay | Admitting: Nurse Practitioner

## 2021-07-04 ENCOUNTER — Telehealth (INDEPENDENT_AMBULATORY_CARE_PROVIDER_SITE_OTHER): Payer: 59 | Admitting: Nurse Practitioner

## 2021-07-04 VITALS — Wt 205.0 lb

## 2021-07-04 DIAGNOSIS — Z6837 Body mass index (BMI) 37.0-37.9, adult: Secondary | ICD-10-CM

## 2021-07-04 DIAGNOSIS — E6609 Other obesity due to excess calories: Secondary | ICD-10-CM

## 2021-07-04 DIAGNOSIS — F902 Attention-deficit hyperactivity disorder, combined type: Secondary | ICD-10-CM | POA: Diagnosis not present

## 2021-07-04 MED ORDER — OZEMPIC (0.25 OR 0.5 MG/DOSE) 2 MG/1.5ML ~~LOC~~ SOPN: 0.5000 mg | PEN_INJECTOR | SUBCUTANEOUS | 4 refills | Status: DC

## 2021-07-04 NOTE — Assessment & Plan Note (Signed)
Ongoing, has taken medication multiple times in past, starting in childhood.  Will continue Adderall XR 10 MG (two tablets) daily, to assist her with school and working full-time.  In past did not tolerate Ritalin or Concerta per her report, will avoid these.  UDS up to date and contract.  She is aware of need for every 3 month visits and yearly UDS, agrees with this plan.   At this time may need to adjust medications further if unable to get needed supply -- Adderall XR 30 MG daily or Adderall IR 10 MG BID or complete change to Vyvanse.  She will update provider when refills due. ?

## 2021-07-04 NOTE — Progress Notes (Signed)
? ?Wt 205 lb (93 kg)   BMI 37.49 kg/m?   ? ?Subjective:  ? ? Patient ID: [name], female    DOB: 05/06/1985, 36 y.o.   MRN: [id number] ? ?HPI: ?Bailey ConesHeather Rose Mcgurn is a 36 y.o. female ? ?Chief Complaint  ?Patient presents with  ? Medication Management  ?  Patient is here to discuss recent dosage change in her Adderrall prescription. Patient denies having any concerns at today's visit.   ? ?This visit was completed via video visit through MyChart due to the restrictions of the COVID-19 pandemic. All issues as above were discussed and addressed. Physical exam was done as above through visual confirmation on video through MyChart. If it was felt that the patient should be evaluated in the office, they were directed there. The patient verbally consented to this visit. ?Location of the patient: home ?Location of the provider: work ?Those involved with this call:  ?Provider: Aura DialsJolene Ayo Guarino, DNP ?CMA: Malen Gauzeestiny Knight, CMA ?Front Desk/Registration: [employer]  ?Time spent on call:  21 minutes with patient face to face via video conference. More than 50% of this time was spent in counseling and coordination of care. 15 minutes total spent in review of patient's record and preparation of their chart.  ?I verified patient identity using two factors (patient name and date of birth). Patient consents verbally to being seen via telemedicine visit today.   ?  ?ADHD FOLLOW UP ?Continues on Adderall daily which is offering benefit -- to mood and focus at work and school.  PDMP last fill was 06/28/21.  We had to switch to XR two 10 MG tablets due to shortage of medication, was on XR 20 MG tablet which worked well. ? ?She needs refills on weight management medication today.  Currently taking Ozempic 0.5 MG -- has been on this for 4 weeks.  Will need refills in 2 weeks, if losing still does not want to go up on dose.  Currently has lost 35 pounds. ?ADHD status: controlled ?Satisfied with current therapy: yes ?Medication  compliance:  good compliance ?Controlled substance contract: yes ?Previous psychiatry evaluation: no ?Previous medications: yes adderall and adderall XR   ?Taking meds on weekends/vacations: yes ?Work/school performance:  good ?Difficulty sustaining attention/completing tasks: no ?Distracted by extraneous stimuli: no ?Does not listen when spoken to: no  ?Fidgets with hands or feet: no ?Unable to stay in seat: no ?Blurts out/interrupts others: no ?ADHD Medication Side Effects: no ?   Decreased appetite: no ?   Headache: no ?   Sleeping disturbance pattern: no ?   Irritability: no ?   Rebound effects (worse than baseline) off medication: no ?   Anxiousness: no ?   Dizziness: no ?   Tics: no  ?  ?Relevant past medical, surgical, family and social history reviewed and updated as indicated. Interim medical history since our last visit reviewed. ?Allergies and medications reviewed and updated. ? ?Review of Systems  ?Constitutional:  Negative for activity change, appetite change, diaphoresis, fatigue and fever.  ?Respiratory:  Negative for cough, chest tightness and shortness of breath.   ?Cardiovascular:  Negative for chest pain, palpitations and leg swelling.  ?Gastrointestinal: Negative.   ?Neurological: Negative.   ?Psychiatric/Behavioral: Negative.    ? ?Per HPI unless specifically indicated above ? ?   ?Objective:  ?  ?Wt 205 lb (93 kg)   BMI 37.49 kg/m?   ?Wt Readings from Last 3 Encounters:  ?07/04/21 205 lb (93 kg)  ?05/18/21 224 lb 12.8 oz (  102 kg)  ?02/17/21 240 lb 12.8 oz (109.2 kg)  ?  ?Physical Exam ?Vitals and nursing note reviewed.  ?Constitutional:   ?   General: She is awake. She is not in acute distress. ?   Appearance: She is well-developed. She is not ill-appearing.  ?HENT:  ?   Head: Normocephalic.  ?   Right Ear: Hearing normal.  ?   Left Ear: Hearing normal.  ?Eyes:  ?   General: Lids are normal.     ?   Right eye: No discharge.     ?   Left eye: No discharge.  ?   Conjunctiva/sclera: Conjunctivae  normal.  ?Pulmonary:  ?   Effort: Pulmonary effort is normal. No accessory muscle usage or respiratory distress.  ?Musculoskeletal:  ?   Cervical back: Normal range of motion.  ?Neurological:  ?   Mental Status: She is alert and oriented to person, place, and time.  ?Psychiatric:     ?   Attention and Perception: Attention normal.     ?   Mood and Affect: Mood normal.     ?   Behavior: Behavior normal. Behavior is cooperative.     ?   Thought Content: Thought content normal.     ?   Judgment: Judgment normal.  ? ? ?Results for orders placed or performed in visit on 02/17/21  ?[id number] 11+Oxyco+Alc+Crt-Bund  ?Result Value Ref Range  ? Ethanol Negative Cutoff=0.020 %  ? Amphetamines, Urine Negative Cutoff=1000 ng/mL  ? Barbiturate Negative Cutoff=200 ng/mL  ? BENZODIAZ UR QL Negative Cutoff=200 ng/mL  ? Cannabinoid Quant, Ur Negative Cutoff=50 ng/mL  ? Cocaine (Metabolite) Negative Cutoff=300 ng/mL  ? OPIATE SCREEN URINE Negative Cutoff=300 ng/mL  ? Oxycodone/Oxymorphone, Urine Negative Cutoff=300 ng/mL  ? Phencyclidine Negative Cutoff=25 ng/mL  ? Methadone Screen, Urine Negative Cutoff=300 ng/mL  ? Propoxyphene Negative Cutoff=300 ng/mL  ? Meperidine Negative Cutoff=200 ng/mL  ? Tramadol Negative Cutoff=200 ng/mL  ? Creatinine 244.2 20.0 - 300.0 mg/dL  ? pH, Urine 6.1 4.5 - 8.9  ? ?   ?Assessment & Plan:  ? ?Problem List Items Addressed This Visit   ? ?  ? Other  ? ADHD (attention deficit hyperactivity disorder)  ?  Ongoing, has taken medication multiple times in past, starting in childhood.  Will continue Adderall XR 10 MG (two tablets) daily, to assist her with school and working full-time.  In past did not tolerate Ritalin or Concerta per her report, will avoid these.  UDS up to date and contract.  She is aware of need for every 3 month visits and yearly UDS, agrees with this plan.   At this time may need to adjust medications further if unable to get needed supply -- Adderall XR 30 MG daily or Adderall IR 10 MG BID  or complete change to Vyvanse.  She will update provider when refills due. ?  ?  ? Obesity - Primary  ?  BMI 41.12, has lost 35 pounds thus far and is taking Ozempic obtain via weight loss program online.  PCP will take over ordering this in future.  Recommended eating smaller high protein, low fat meals more frequently and exercising 30 mins a day 5 times a week with a goal of 10-15lb weight loss in the next 3 months. Patient voiced their understanding and motivation to adhere to these recommendations.   ? ?  ?  ? Relevant Medications  ? OZEMPIC, 0.25 OR 0.5 MG/DOSE, 2 MG/1.5ML SOPN  ?  ?I discussed the  assessment and treatment plan with the patient. The patient was provided an opportunity to ask questions and all were answered. The patient agreed with the plan and demonstrated an understanding of the instructions. ? ? ?The patient was advised to call back or seek an in-person evaluation if the symptoms worsen or if the condition fails to improve as anticipated. ? ? ?I provided 21+ minutes of time during this encounter.  ? ?Follow up plan: ?Return for as scheduled in May -- will do all labs at visit. ? ? ? ? ? ?

## 2021-07-04 NOTE — Assessment & Plan Note (Signed)
BMI 41.12, has lost 35 pounds thus far and is taking Ozempic obtain via weight loss program online.  PCP will take over ordering this in future.  Recommended eating smaller high protein, low fat meals more frequently and exercising 30 mins a day 5 times a week with a goal of 10-15lb weight loss in the next 3 months. Patient voiced their understanding and motivation to adhere to these recommendations.   ? ?

## 2021-07-12 ENCOUNTER — Encounter: Payer: Self-pay | Admitting: Nurse Practitioner

## 2021-07-12 MED ORDER — OZEMPIC (1 MG/DOSE) 4 MG/3ML ~~LOC~~ SOPN: 1.0000 mg | PEN_INJECTOR | SUBCUTANEOUS | 4 refills | Status: DC

## 2021-07-12 NOTE — Addendum Note (Signed)
Addended by: Aura Dials T on: 07/12/2021 12:18 PM ? ? Modules accepted: Orders ? ?

## 2021-07-12 NOTE — Addendum Note (Signed)
Addended by: Aura Dials T on: 07/12/2021 04:54 PM ? ? Modules accepted: Orders ? ?

## 2021-07-27 ENCOUNTER — Encounter: Payer: Self-pay | Admitting: Nurse Practitioner

## 2021-07-27 MED ORDER — AMPHETAMINE-DEXTROAMPHET ER 30 MG PO CP24: 30.0000 mg | ORAL_CAPSULE | ORAL | 0 refills | Status: DC

## 2021-08-13 NOTE — Patient Instructions (Signed)
Living With Attention Deficit Hyperactivity Disorder If you have been diagnosed with attention deficit hyperactivity disorder (ADHD), you may be relieved that you now know why you have felt or behaved a certain way. Still, you may feel overwhelmed about the treatment ahead. You may also wonder how to get the support you need and how to deal with the condition day-to-day. With treatment and support, you can live with ADHD and manage your symptoms. How to manage lifestyle changes Managing stress Stress is your body's reaction to life changes and events, both good and bad. To cope with the stress of an ADHD diagnosis, it may help to: Learn more about ADHD. Exercise regularly. Even a short daily walk can lower stress levels. Participate in training or education programs (including social skills training classes) that teach you to deal with symptoms.  Medicines Your health care provider may suggest certain medicines if he or she feels that they will help to improve your condition. Stimulant medicines are usually prescribed to treat ADHD, and therapy may also be prescribed. It is important to: Avoid using alcohol and other substances that may prevent your medicines from working properly (may interact). Talk with your pharmacist or health care provider about all the medicines that you take, their possible side effects, and what medicines are safe to take together. Make it your goal to take part in all treatment decisions (shared decision-making). Ask about possible side effects of medicines that your health care provider recommends, and tell him or her how you feel about having those side effects. It is best if shared decision-making with your health care provider is part of your total treatment plan. Relationships To strengthen your relationships with family members while treating your condition, consider taking part in family therapy. You might also attend self-help groups alone or with a loved one. Be  honest about how your symptoms affect your relationships. Make an effort to communicate respectfully instead of fighting, and find ways to show others that you care. Psychotherapy may be useful in helping you cope with how ADHD affects your relationships. How to recognize changes in your condition The following signs may mean that your treatment is working well and your condition is improving: Consistently being on time for appointments. Being more organized at home and work. Other people noticing improvements in your behavior. Achieving goals that you set for yourself. Thinking more clearly. The following signs may mean that your treatment is not working very well: Feeling impatience or more confusion. Missing, forgetting, or being late for appointments. An increasing sense of disorganization and messiness. More difficulty in reaching goals that you set for yourself. Loved ones becoming angry or frustrated with you. Follow these instructions at home: Take over-the-counter and prescription medicines only as told by your health care provider. Check with your health care provider before taking any new medicines. Create structure and an organized atmosphere at home. For example: Make a list of tasks, then rank them from most important to least important. Work on one task at a time until your listed tasks are done. Make a daily schedule and follow it consistently every day. Use an appointment calendar, and check it 2 or 3 times a day to keep on track. Keep it with you when you leave the house. Create spaces where you keep certain things, and always put things back in their places after you use them. Keep all follow-up visits as told by your health care provider. This is important. Where to find support Talking to others    Keep emotion out of important discussions and speak in a calm, logical way. Listen closely and patiently to your loved ones. Try to understand their point of view, and try to  avoid getting defensive. Take responsibility for the consequences of your actions. Ask that others do not take your behaviors personally. Aim to solve problems as they come up, and express your feelings instead of bottling them up. Talk openly about what you need from your loved ones and how they can support you. Consider going to family therapy sessions or having your family meet with a specialist who deals with ADHD-related behavior problems. Finances Not all insurance plans cover mental health care, so it is important to check with your insurance carrier. If paying for co-pays or counseling services is a problem, search for a local or county mental health care center. Public mental health care services may be offered there at a low cost or no cost when you are not able to see a private health care provider. If you are taking medicine for ADHD, you may be able to get the generic form, which may be less expensive than brand-name medicine. Some makers of prescription medicines also offer help to patients who cannot afford the medicines that they need. Questions to ask your health care provider: What are the risks and benefits of taking medicines? Would I benefit from therapy? How often should I follow up with a health care provider? Contact a health care provider if: You have side effects from your medicines, such as: Repeated muscle twitches, coughing, or speech outbursts. Sleep problems. Loss of appetite. Depression. New or worsening behavior problems. Dizziness. Unusually fast heartbeat. Stomach pains. Headaches. Get help right away if: You have a severe reaction to a medicine. Your behavior suddenly gets worse. Summary With treatment and support, you can live with ADHD and manage your symptoms. The medicines that are most often prescribed for ADHD are stimulants. Consider taking part in family therapy or self-help groups with family members or friends. When you talk with friends  and family about your ADHD, be patient and communicate openly. Take over-the-counter and prescription medicines only as told by your health care provider. Check with your health care provider before taking any new medicines. This information is not intended to replace advice given to you by your health care provider. Make sure you discuss any questions you have with your health care provider. Document Revised: 08/07/2019 Document Reviewed: 09/09/2019 [employer] Patient Education  2023 [employer]  

## 2021-08-15 ENCOUNTER — Encounter: Payer: Self-pay | Admitting: Nurse Practitioner

## 2021-08-15 ENCOUNTER — Ambulatory Visit: Payer: Self-pay | Admitting: Nurse Practitioner

## 2021-08-15 ENCOUNTER — Ambulatory Visit
Admission: RE | Admit: 2021-08-15 | Discharge: 2021-08-15 | Disposition: A | Discharge status: Home / Self Care | Payer: Managed Care, Other (non HMO) | Source: Home / Self Care | Attending: Nurse Practitioner | Admitting: Nurse Practitioner

## 2021-08-15 ENCOUNTER — Ambulatory Visit (INDEPENDENT_AMBULATORY_CARE_PROVIDER_SITE_OTHER): Payer: 59 | Admitting: Nurse Practitioner

## 2021-08-15 ENCOUNTER — Telehealth: Payer: Self-pay

## 2021-08-15 ENCOUNTER — Ambulatory Visit
Admission: RE | Admit: 2021-08-15 | Discharge: 2021-08-15 | Disposition: A | Discharge status: Home / Self Care | Payer: Managed Care, Other (non HMO) | Source: Ambulatory Visit | Attending: Nurse Practitioner | Admitting: Nurse Practitioner

## 2021-08-15 ENCOUNTER — Other Ambulatory Visit: Payer: Self-pay | Admitting: Nurse Practitioner

## 2021-08-15 VITALS — BP 127/82 | HR 80 | Temp 98.8°F | Ht 62.0 in | Wt 194.2 lb

## 2021-08-15 DIAGNOSIS — Z6837 Body mass index (BMI) 37.0-37.9, adult: Secondary | ICD-10-CM

## 2021-08-15 DIAGNOSIS — M79605 Pain in left leg: Secondary | ICD-10-CM | POA: Diagnosis present

## 2021-08-15 DIAGNOSIS — E282 Polycystic ovarian syndrome: Secondary | ICD-10-CM

## 2021-08-15 DIAGNOSIS — R7989 Other specified abnormal findings of blood chemistry: Secondary | ICD-10-CM | POA: Diagnosis not present

## 2021-08-15 DIAGNOSIS — F902 Attention-deficit hyperactivity disorder, combined type: Secondary | ICD-10-CM | POA: Diagnosis not present

## 2021-08-15 DIAGNOSIS — E6609 Other obesity due to excess calories: Secondary | ICD-10-CM

## 2021-08-15 DIAGNOSIS — E559 Vitamin D deficiency, unspecified: Secondary | ICD-10-CM

## 2021-08-15 MED ORDER — WEGOVY 1.7 MG/0.75ML ~~LOC~~ SOAJ: 1.7000 mg | SUBCUTANEOUS | 4 refills | Status: DC

## 2021-08-15 MED ORDER — AMPHETAMINE-DEXTROAMPHET ER 30 MG PO CP24: 30.0000 mg | ORAL_CAPSULE | ORAL | 0 refills | Status: DC

## 2021-08-15 MED ORDER — WEGOVY 1.7 MG/0.75ML ~~LOC~~ SOAJ
1.7000 mg | SUBCUTANEOUS | 4 refills | Status: DC
Start: 2021-08-15 — End: 2021-12-05

## 2021-08-15 NOTE — Progress Notes (Signed)
? ?BP 127/82   Pulse 80   Temp 98.8 ?F (37.1 ?C) (Oral)   Ht 5\' 2"  (1.575 m)   Wt 194 lb 3.2 oz (88.1 kg)   SpO2 99%   BMI 35.52 kg/m?   ? ?Subjective:  ? ? Patient ID: [name], female    DOB: 08/28/1985, 36 y.o.   MRN: [id number] ? ?HPI: ?[name] is a 36 y.o. female ? ?Chief Complaint  ?Patient presents with  ? ADHD  ?  Patient is here to follow up on ADHD. Patient denies having any concerns at today's visit.   ? Medication Dose Change  ?  Patient says she is asking if the dose will change on her current Ozempic prescription. Patient is requesting labs at today's visit.   ? Leg Pain  ?  Patient is requesting imaging because she is having pain that she is experiencing in her L thigh and she was told that it was sciatica in the past and to lose weight. Patient states she is down 50-lbs and she having the pain in her L thigh and not so much in her back as much.   ? ?ADHD FOLLOW UP + PCOS (WEIGHT LOSS) ?Continues on Adderall daily which is offering benefit -- to mood and focus at work and school.  PDMP last fill was 07/29/21.  We had to switch to 30 MG XR tablets due to shortage of medication, was on XR 20 MG tablet which worked well. ?  ?Currently taking Ozempic 1 MG -- started back in February.  Has lost 30 pounds since starting Ozempic -- in December was 242 at home. She is working on exercise and diet changes as well.  Has PCOS, [facility], so can go up to 1.7 MG dosing as noticing some reduction in benefit with 1 MG Ozempic.  History of elevation in TSH and low Vitamin D on past labs. ?ADHD status: controlled ?Satisfied with current therapy: yes ?Medication compliance:  good compliance ?Controlled substance contract: yes ?Previous psychiatry evaluation: no ?Previous medications: yes adderall and adderall XR   ?Taking meds on weekends/vacations: yes ?Work/school performance:  good ?Difficulty sustaining attention/completing tasks: no ?Distracted by extraneous  stimuli: no ?Does not listen when spoken to: no  ?Fidgets with hands or feet: no ?Unable to stay in seat: no ?Blurts out/interrupts others: no ?ADHD Medication Side Effects: no ?   Decreased appetite: no ?   Headache: no ?   Sleeping disturbance pattern: no ?   Irritability: no ?   Rebound effects (worse than baseline) off medication: no ?   Anxiousness: no ?   Dizziness: no ?   Tics: no  ? ?LEG PAIN ?For years has had issues with left leg pain.  Was told in past that it was sciatic pain and to lose weight.   ?Duration: chronic ?Involved leg: left  ?Mechanism of injury:  previous surgery to left knee ?Location: anterior ?Onset: gradual  ?Severity: mild to moderate ?Quality: burning and shooting ?Frequency: intermittent ?Radiation: no ?Aggravating factors: walking and movement   ?Alleviating factors: nothing  ?Status: stable ?Treatments attempted: muscle rub ?Relief with NSAIDs?: No NSAIDs Taken ?Weakness with weight bearing: no ?Weakness with walking: no ?Paresthesias / decreased sensation: no ?Swelling: no ?Redness:no ?Fevers: no  ? ?Relevant past medical, surgical, family and social history reviewed and updated as indicated. Interim medical history since our last visit reviewed. ?Allergies and medications reviewed and updated. ? ?Review of Systems  ?Constitutional:  Negative  for activity change, appetite change, diaphoresis, fatigue and fever.  ?Respiratory:  Negative for cough, chest tightness and shortness of breath.   ?Cardiovascular:  Negative for chest pain, palpitations and leg swelling.  ?Gastrointestinal: Negative.   ?Musculoskeletal:  Positive for arthralgias.  ?Neurological: Negative.   ?Psychiatric/Behavioral: Negative.    ? ?Per HPI unless specifically indicated above ? ?   ?Objective:  ?  ?BP 127/82   Pulse 80   Temp 98.8 ?F (37.1 ?C) (Oral)   Ht 5\' 2"  (1.575 m)   Wt 194 lb 3.2 oz (88.1 kg)   SpO2 99%   BMI 35.52 kg/m?   ?Wt Readings from Last 3 Encounters:  ?08/15/21 194 lb 3.2 oz (88.1 kg)   ?07/04/21 205 lb (93 kg)  ?05/18/21 224 lb 12.8 oz (102 kg)  ?  ?Physical Exam ?Vitals and nursing note reviewed.  ?Constitutional:   ?   General: She is awake. She is not in acute distress. ?   Appearance: She is well-developed and well-groomed. She is morbidly obese. She is not ill-appearing or toxic-appearing.  ?HENT:  ?   Head: Normocephalic.  ?   Right Ear: Hearing normal.  ?   Left Ear: Hearing normal.  ?   Nose: Nose normal.  ?   Mouth/Throat:  ?   Mouth: Mucous membranes are moist.  ?Eyes:  ?   General: Lids are normal.     ?   Right eye: No discharge.     ?   Left eye: No discharge.  ?   Conjunctiva/sclera: Conjunctivae normal.  ?   Pupils: Pupils are equal, round, and reactive to light.  ?Neck:  ?   Thyroid: No thyromegaly.  ?   Vascular: No carotid bruit or JVD.  ?Cardiovascular:  ?   Rate and Rhythm: Normal rate and regular rhythm.  ?   Heart sounds: Normal heart sounds. No murmur heard. ?  No gallop.  ?Pulmonary:  ?   Effort: Pulmonary effort is normal.  ?   Breath sounds: Normal breath sounds.  ?Abdominal:  ?   General: Bowel sounds are normal.  ?   Palpations: Abdomen is soft.  ?Musculoskeletal:  ?   Cervical back: Normal range of motion and neck supple.  ?   Lumbar back: No swelling, lacerations, spasms or tenderness. Normal range of motion. Negative right straight leg raise test and negative left straight leg raise test.  ?   Right hip: Normal.  ?   Left hip: Normal.  ?   Right upper leg: Normal.  ?   Left upper leg: Normal.  ?   Right lower leg: No swelling. No edema.  ?   Left lower leg: No swelling. No edema.  ?   Comments: Tenderness along IT band area left side.  ?Lymphadenopathy:  ?   Cervical: No cervical adenopathy.  ?Skin: ?   General: Skin is warm and dry.  ?Neurological:  ?   Mental Status: She is alert and oriented to person, place, and time.  ?Psychiatric:     ?   Attention and Perception: Attention normal.     ?   Mood and Affect: Mood normal.     ?   Behavior: Behavior normal.  Behavior is cooperative.     ?   Thought Content: Thought content normal.     ?   Judgment: Judgment normal.  ? ?Results for orders placed or performed in visit on 07/13/21  ?HM PAP SMEAR  ?Result Value Ref Range  ?  HM Pap smear see result scanned into chart   ? ?   ?Assessment & Plan:  ? ?Problem List Items Addressed This Visit   ? ?  ? Endocrine  ? PCOS (polycystic ovarian syndrome)  ?  Chronic, ongoing, taking Spironolactone and Ozempic at this time with benefit, wishes to switch to [facility].  Labs today A1c, CMP, CBC, TSH, lipid.   ? ?  ?  ? Relevant Orders  ? CBC with Differential/Platelet  ? Comprehensive metabolic panel  ? Lipid Panel w/o Chol/HDL Ratio  ? HgB A1c  ?  ? Other  ? ADHD (attention deficit hyperactivity disorder) - Primary  ?  Ongoing, has taken medication multiple times in past, starting in childhood.  Will continue Adderall XR 30 MG daily, to assist her with school and working full-time.  In past did not tolerate Ritalin or Concerta per her report, will avoid these.  UDS up to date and contract -- next due 02/17/22.  She is aware of need for every 3 month visits and yearly UDS, agrees with this plan.    ? ?  ?  ? Elevated TSH  ?  Noted on past labs, with improvement recent check.  TSH on labs today. ? ?  ?  ? Relevant Orders  ? TSH  ? Left leg pain  ?  Ongoing, ?ITB issues.  At this time due to history will obtain imaging of lumbar spine + left hip and knee to further assess for underlying causes.  Recommend she use foam roller at home to massage ITB and gentle stretches.  Tylenol and Ibuprofen as needed + Biofreeze.  If worsening return to office and may send to PT. ? ?  ?  ? Relevant Orders  ? DG Lumbar Spine Complete  ? DG HIP UNILAT W OR W/O PELVIS 2-3 VIEWS LEFT  ? DG Knee Complete 4 Views Left  ? Obesity  ?  BMI 35.52, has lost >30 pounds thus far and is taking Ozempic, will attempt to switch to [facility] as this is more for weight loss and not diabetes -- increased to 1.7 MG Wegovy dosing and  send in [place].  This will offer benefit to PCOS and overall health.  Recommended eating smaller high protein, low fat meals more frequently and exercising 30 mins a day 5 times a week with a goal of 10-15lb weight loss in the n

## 2021-08-15 NOTE — Assessment & Plan Note (Signed)
Ongoing, has taken medication multiple times in past, starting in childhood.  Will continue Adderall XR 30 MG daily, to assist her with school and working full-time.  In past did not tolerate Ritalin or Concerta per her report, will avoid these.  UDS up to date and contract -- next due 02/17/22.  She is aware of need for every 3 month visits and yearly UDS, agrees with this plan.    ?

## 2021-08-15 NOTE — Telephone Encounter (Signed)
PA for [facility] initiated and submitted via Cover My Meds. Key: [id number] ?

## 2021-08-15 NOTE — Assessment & Plan Note (Addendum)
BMI 35.52, has lost >30 pounds thus far and is taking Ozempic, will attempt to switch to [employer] as this is more for weight loss and not diabetes -- increased to 1.7 MG Wegovy dosing and send in [place].  This will offer benefit to PCOS and overall health.  Recommended eating smaller high protein, low fat meals more frequently and exercising 30 mins a day 5 times a week with a goal of 10-15lb weight loss in the next 3 months. Patient voiced their understanding and motivation to adhere to these recommendations.   ? ?

## 2021-08-15 NOTE — Assessment & Plan Note (Signed)
Chronic, ongoing, recommend she initiate supplement for this.  Recheck level today. ?

## 2021-08-15 NOTE — Assessment & Plan Note (Signed)
Ongoing, ?ITB issues.  At this time due to history will obtain imaging of lumbar spine + left hip and knee to further assess for underlying causes.  Recommend she use foam roller at home to massage ITB and gentle stretches.  Tylenol and Ibuprofen as needed + Biofreeze.  If worsening return to office and may send to PT. ?

## 2021-08-15 NOTE — Assessment & Plan Note (Addendum)
Chronic, ongoing, taking Spironolactone and Ozempic at this time with benefit, wishes to switch to [facility].  Labs today A1c, CMP, CBC, TSH, lipid.   ?

## 2021-08-15 NOTE — Assessment & Plan Note (Signed)
Noted on past labs, with improvement recent check.  TSH on labs today. ?

## 2021-08-16 LAB — CBC WITH DIFFERENTIAL/PLATELET
Basophils Absolute: 0 10*3/uL (ref 0.0–0.2)
Basos: 1 %
EOS (ABSOLUTE): 0.1 10*3/uL (ref 0.0–0.4)
Eos: 1 %
Hematocrit: 43.4 % (ref 34.0–46.6)
Hemoglobin: 14.4 g/dL (ref 11.1–15.9)
Immature Grans (Abs): 0 10*3/uL (ref 0.0–0.1)
Immature Granulocytes: 1 %
Lymphocytes Absolute: 1.8 10*3/uL (ref 0.7–3.1)
Lymphs: 42 %
MCH: 31.6 pg (ref 26.6–33.0)
MCHC: 33.2 g/dL (ref 31.5–35.7)
MCV: 95 fL (ref 79–97)
Monocytes Absolute: 0.3 10*3/uL (ref 0.1–0.9)
Monocytes: 7 %
Neutrophils Absolute: 2.1 10*3/uL (ref 1.4–7.0)
Neutrophils: 48 %
Platelets: 378 10*3/uL (ref 150–450)
RBC: 4.55 x10E6/uL (ref 3.77–5.28)
RDW: 12.6 % (ref 11.7–15.4)
WBC: 4.4 10*3/uL (ref 3.4–10.8)

## 2021-08-16 LAB — HEMOGLOBIN A1C
Est. average glucose Bld gHb Est-mCnc: 97 mg/dL
Hgb A1c MFr Bld: 5 % (ref 4.8–5.6)

## 2021-08-16 LAB — LIPID PANEL W/O CHOL/HDL RATIO
Cholesterol, Total: 140 mg/dL (ref 100–199)
HDL: 46 mg/dL (ref >39)
LDL Chol Calc (NIH): 80 mg/dL (ref 0–99)
Triglycerides: 71 mg/dL (ref 0–149)
VLDL Cholesterol Cal: 14 mg/dL (ref 5–40)

## 2021-08-16 LAB — COMPREHENSIVE METABOLIC PANEL
ALT: 54 IU/L — ABNORMAL HIGH (ref 0–32)
AST: 29 IU/L (ref 0–40)
Albumin/Globulin Ratio: 2.2 (ref 1.2–2.2)
Albumin: 4.8 g/dL (ref 3.8–4.8)
Alkaline Phosphatase: 83 IU/L (ref 44–121)
BUN/Creatinine Ratio: 7 — ABNORMAL LOW (ref 9–23)
BUN: 6 mg/dL (ref 6–20)
Bilirubin Total: 0.8 mg/dL (ref 0.0–1.2)
CO2: 24 mmol/L (ref 20–29)
Calcium: 9.5 mg/dL (ref 8.7–10.2)
Chloride: 101 mmol/L (ref 96–106)
Creatinine, Ser: 0.89 mg/dL (ref 0.57–1.00)
Globulin, Total: 2.2 g/dL (ref 1.5–4.5)
Glucose: 87 mg/dL (ref 70–99)
Potassium: 4.5 mmol/L (ref 3.5–5.2)
Sodium: 138 mmol/L (ref 134–144)
Total Protein: 7 g/dL (ref 6.0–8.5)
eGFR: 86 mL/min/{1.73_m2} (ref >59)

## 2021-08-16 LAB — TSH: TSH: 1.91 u[IU]/mL (ref 0.450–4.500)

## 2021-08-16 LAB — VITAMIN D 25 HYDROXY (VIT D DEFICIENCY, FRACTURES): Vit D, 25-Hydroxy: 31 ng/mL (ref 30.0–100.0)

## 2021-08-16 NOTE — Progress Notes (Signed)
Contacted via MyChart ? ? ?Good evening [name], your imaging has returned.  I will sandwich it into one note here.  Lower back showed no arthritic or acute changes.  Left knee shows stable joint spaces with no arthritis.  There is mild arthritis noted to the pubic symphysis, this is the joint located between your right and left bones and may be cause of your discomfort.  I would recommend if ongoing pain we get you into physical therapy for this to work on strengthening and pain reduction.  Any questions? ?Keep being amazing!!  Thank you for allowing me to participate in your care.  I appreciate you. ?Kindest regards, ?Earl Losee ?

## 2021-08-16 NOTE — Progress Notes (Signed)
Contacted via [place] ? ? ?Lab time, all has returned: ?- Kidney function, creatinine and eGFR, remains normal.  Liver function shows normal AST and ALT is mildly elevated at 54, for this work on healthy diet changes and regular activity and cut back on any Tylenol use. ?- CBC shows no anemia or infection. ?- Cholesterol labs look great!! ?- Thyroid level, TSH, is normal. ?- A1c shows no diabetes or prediabetes. ?- Vitamin D level is normal. ?Keep being amazing!!  Thank you for allowing me to participate in your care.  I appreciate you. ?Kindest regards, ?Kerriann Kamphuis ?

## 2021-08-16 NOTE — Telephone Encounter (Signed)
PA approvoed. Sent patient a Clinical cytogeneticist message letting her know of approval.  ?

## 2021-08-16 NOTE — Telephone Encounter (Signed)
Requested medication (s) are due for refill today: no ? ?Requested medication (s) are on the active medication list: yes ? ?Last refill:  08/15/21 ? ?Future visit scheduled: no ? ?Notes to clinic:  Unable to refill per protocol, last refill by provider 08/15/21. Pharmacy request alternative medication, due to medication not being covered by patient insurance. ? ? ?  ?Requested Prescriptions  ?Pending Prescriptions Disp Refills  ? WEGOVY 1.7 MG/0.75ML SOAJ [Pharmacy Med Name: WEGOVY 1.7 MG/0.75 ML PEN]  4  ?  Sig: Inject 1.7 mg into the skin once a week.  ?  ? Endocrinology:  Diabetes - GLP-1 Receptor Agonists - semaglutide Failed - 08/15/2021 12:08 PM  ?  ?  Failed - HBA1C in normal range and within 180 days  ?  Hgb A1c MFr Bld  ?Date Value Ref Range Status  ?08/15/2021 5.0 4.8 - 5.6 % Final  ?  Comment:  ?           Prediabetes: 5.7 - 6.4 ?         Diabetes: >6.4 ?         Glycemic control for adults with diabetes: <7.0 ?  ?  ?  ?  ?  Passed - Cr in normal range and within 360 days  ?  Creatinine  ?Date Value Ref Range Status  ?02/17/2021 244.2 20.0 - 300.0 mg/dL Final  ? ?Creatinine, Ser  ?Date Value Ref Range Status  ?08/15/2021 0.89 0.57 - 1.00 mg/dL Final  ? ?Creatinine,U  ?Date Value Ref Range Status  ?05/07/2008 232.3 mg/dL Final  ?  Comment:  ?  See lab report for associated comment(s)  ?  ?  ?  ?  Passed - Valid encounter within last 6 months  ?  Recent Outpatient Visits   ? ?      ? Yesterday Attention deficit hyperactivity disorder (ADHD), combined type  ? [facility] [place], Lyons T, NP  ? 1 month ago Class 2 obesity due to excess calories without serious comorbidity with body mass index (BMI) of 37.0 to 37.9 in adult  ? [facility] [place], Vesta T, NP  ? 3 months ago Attention deficit hyperactivity disorder (ADHD), combined type  ? [facility] [place], [place] T, NP  ? 6 months ago Attention deficit hyperactivity disorder (ADHD), combined type  ? [facility] [place], H[place]ham T, NP  ? 7 months ago Atopic dermatitis of eyelid, unspecified laterality  ? [facility] [place], [name] T, NP  ? ?  ?  ?Future Appointments   ? ?        ? In 3 months [place], Dorie Rank, NP [employer], [facility]  ? ?  ? ? ?  ?  ?  ? ? ?

## 2021-08-17 ENCOUNTER — Encounter: Payer: Self-pay | Admitting: Nurse Practitioner

## 2021-08-28 ENCOUNTER — Encounter: Payer: Self-pay | Admitting: Nurse Practitioner

## 2021-08-28 MED ORDER — AMPHETAMINE-DEXTROAMPHET ER 30 MG PO CP24: 30.0000 mg | ORAL_CAPSULE | ORAL | 0 refills | Status: DC

## 2021-10-02 ENCOUNTER — Encounter: Payer: Self-pay | Admitting: Nurse Practitioner

## 2021-10-02 MED ORDER — AMPHETAMINE-DEXTROAMPHET ER 30 MG PO CP24: 30.0000 mg | ORAL_CAPSULE | ORAL | 0 refills | Status: DC

## 2021-10-11 ENCOUNTER — Ambulatory Visit: Payer: Self-pay | Admitting: General Surgery

## 2021-10-11 ENCOUNTER — Ambulatory Visit: Payer: Managed Care, Other (non HMO) | Admitting: Certified Registered"

## 2021-10-11 ENCOUNTER — Other Ambulatory Visit: Payer: Self-pay

## 2021-10-11 ENCOUNTER — Ambulatory Visit
Admission: RE | Admit: 2021-10-11 | Discharge: 2021-10-11 | Disposition: A | Discharge status: Home / Self Care | Payer: Managed Care, Other (non HMO) | Source: Ambulatory Visit | Attending: General Surgery | Admitting: General Surgery

## 2021-10-11 ENCOUNTER — Other Ambulatory Visit: Payer: Self-pay | Admitting: General Surgery

## 2021-10-11 ENCOUNTER — Encounter
Admission: AD | Disposition: A | Discharge status: Home / Self Care | Payer: Self-pay | Source: Home / Self Care | Attending: General Surgery

## 2021-10-11 ENCOUNTER — Ambulatory Visit
Admission: AD | Admit: 2021-10-11 | Discharge: 2021-10-11 | Disposition: A | Discharge status: Home / Self Care | Payer: Managed Care, Other (non HMO) | Attending: General Surgery | Admitting: General Surgery

## 2021-10-11 ENCOUNTER — Encounter: Payer: Self-pay | Admitting: General Surgery

## 2021-10-11 DIAGNOSIS — K219 Gastro-esophageal reflux disease without esophagitis: Secondary | ICD-10-CM | POA: Insufficient documentation

## 2021-10-11 DIAGNOSIS — E669 Obesity, unspecified: Secondary | ICD-10-CM | POA: Insufficient documentation

## 2021-10-11 DIAGNOSIS — G4730 Sleep apnea, unspecified: Secondary | ICD-10-CM | POA: Insufficient documentation

## 2021-10-11 DIAGNOSIS — Z6838 Body mass index (BMI) 38.0-38.9, adult: Secondary | ICD-10-CM | POA: Insufficient documentation

## 2021-10-11 DIAGNOSIS — E282 Polycystic ovarian syndrome: Secondary | ICD-10-CM | POA: Insufficient documentation

## 2021-10-11 DIAGNOSIS — K8012 Calculus of gallbladder with acute and chronic cholecystitis without obstruction: Secondary | ICD-10-CM | POA: Diagnosis not present

## 2021-10-11 DIAGNOSIS — R1084 Generalized abdominal pain: Secondary | ICD-10-CM | POA: Insufficient documentation

## 2021-10-11 DIAGNOSIS — K810 Acute cholecystitis: Secondary | ICD-10-CM | POA: Diagnosis present

## 2021-10-11 DIAGNOSIS — N200 Calculus of kidney: Secondary | ICD-10-CM | POA: Diagnosis not present

## 2021-10-11 DIAGNOSIS — Z01818 Encounter for other preprocedural examination: Secondary | ICD-10-CM

## 2021-10-11 LAB — POCT PREGNANCY, URINE: Preg Test, Ur: NEGATIVE

## 2021-10-11 SURGERY — CHOLECYSTECTOMY, ROBOT-ASSISTED, LAPAROSCOPIC
Anesthesia: General | Site: Abdomen

## 2021-10-11 MED ORDER — INDOCYANINE GREEN 25 MG IV SOLR
1.2500 mg | Freq: Once | INTRAVENOUS | Status: AC
  Administered 2021-10-11: 1.25 mg via INTRAVENOUS
  Filled 2021-10-11: qty 0.5

## 2021-10-11 MED ORDER — BUPIVACAINE-EPINEPHRINE 0.25% -1:200000 IJ SOLN
INTRAMUSCULAR | Status: DC | PRN
  Administered 2021-10-11: 30 mL

## 2021-10-11 MED ORDER — FENTANYL CITRATE (PF) 100 MCG/2ML IJ SOLN
25.0000 ug | INTRAMUSCULAR | Status: DC | PRN
  Administered 2021-10-11: 50 ug via INTRAVENOUS

## 2021-10-11 MED ORDER — KETOROLAC TROMETHAMINE 30 MG/ML IJ SOLN
INTRAMUSCULAR | Status: AC
  Filled 2021-10-11: qty 1

## 2021-10-11 MED ORDER — OXYCODONE HCL 5 MG/5ML PO SOLN: 5.0000 mg | Freq: Once | ORAL | Status: AC | PRN

## 2021-10-11 MED ORDER — SUGAMMADEX SODIUM 200 MG/2ML IV SOLN
INTRAVENOUS | Status: DC | PRN
  Administered 2021-10-11: 200 mg via INTRAVENOUS

## 2021-10-11 MED ORDER — ONDANSETRON HCL 4 MG/2ML IJ SOLN
INTRAMUSCULAR | Status: AC
  Filled 2021-10-11: qty 2

## 2021-10-11 MED ORDER — DEXAMETHASONE SODIUM PHOSPHATE 10 MG/ML IJ SOLN
INTRAMUSCULAR | Status: AC
  Filled 2021-10-11: qty 1

## 2021-10-11 MED ORDER — ONDANSETRON HCL 4 MG/2ML IJ SOLN
INTRAMUSCULAR | Status: DC | PRN
  Administered 2021-10-11: 4 mg via INTRAVENOUS

## 2021-10-11 MED ORDER — LACTATED RINGERS IV SOLN: INTRAVENOUS | Status: DC

## 2021-10-11 MED ORDER — KETAMINE HCL 50 MG/5ML IJ SOSY
PREFILLED_SYRINGE | INTRAMUSCULAR | Status: AC
  Filled 2021-10-11: qty 5

## 2021-10-11 MED ORDER — ROCURONIUM BROMIDE 10 MG/ML (PF) SYRINGE
PREFILLED_SYRINGE | INTRAVENOUS | Status: AC
  Filled 2021-10-11: qty 10

## 2021-10-11 MED ORDER — SUCCINYLCHOLINE CHLORIDE 200 MG/10ML IV SOSY
PREFILLED_SYRINGE | INTRAVENOUS | Status: DC | PRN
  Administered 2021-10-11: 80 mg via INTRAVENOUS

## 2021-10-11 MED ORDER — MIDAZOLAM HCL 2 MG/2ML IJ SOLN
INTRAMUSCULAR | Status: DC | PRN
  Administered 2021-10-11: 2 mg via INTRAVENOUS

## 2021-10-11 MED ORDER — BUPIVACAINE-EPINEPHRINE (PF) 0.25% -1:200000 IJ SOLN
INTRAMUSCULAR | Status: AC
  Filled 2021-10-11: qty 30

## 2021-10-11 MED ORDER — ONDANSETRON HCL 4 MG/2ML IJ SOLN: 4.0000 mg | Freq: Once | INTRAMUSCULAR | Status: AC | PRN

## 2021-10-11 MED ORDER — ESMOLOL HCL 100 MG/10ML IV SOLN
INTRAVENOUS | Status: DC | PRN
  Administered 2021-10-11 (×2): 10 mg via INTRAVENOUS

## 2021-10-11 MED ORDER — DEXAMETHASONE SODIUM PHOSPHATE 10 MG/ML IJ SOLN
INTRAMUSCULAR | Status: DC | PRN
  Administered 2021-10-11: 10 mg via INTRAVENOUS

## 2021-10-11 MED ORDER — KETOROLAC TROMETHAMINE 30 MG/ML IJ SOLN
INTRAMUSCULAR | Status: DC | PRN
  Administered 2021-10-11: 30 mg via INTRAVENOUS

## 2021-10-11 MED ORDER — DEXMEDETOMIDINE (PRECEDEX) IN NS 20 MCG/5ML (4 MCG/ML) IV SYRINGE
PREFILLED_SYRINGE | INTRAVENOUS | Status: DC | PRN
  Administered 2021-10-11 (×2): 8 ug via INTRAVENOUS
  Administered 2021-10-11: 12 ug via INTRAVENOUS

## 2021-10-11 MED ORDER — LIDOCAINE HCL (PF) 2 % IJ SOLN
INTRAMUSCULAR | Status: AC
  Filled 2021-10-11: qty 5

## 2021-10-11 MED ORDER — CHLORHEXIDINE GLUCONATE 0.12 % MT SOLN: 15.0000 mL | Freq: Once | OROMUCOSAL | Status: AC

## 2021-10-11 MED ORDER — OXYCODONE HCL 5 MG PO TABS
5.0000 mg | ORAL_TABLET | Freq: Once | ORAL | Status: AC | PRN
  Administered 2021-10-11: 5 mg via ORAL

## 2021-10-11 MED ORDER — PROPOFOL 10 MG/ML IV BOLUS
INTRAVENOUS | Status: DC | PRN
  Administered 2021-10-11: 150 mg via INTRAVENOUS

## 2021-10-11 MED ORDER — ONDANSETRON HCL 4 MG/2ML IJ SOLN
INTRAMUSCULAR | Status: AC
  Administered 2021-10-11: 4 mg via INTRAVENOUS
  Filled 2021-10-11: qty 2

## 2021-10-11 MED ORDER — FENTANYL CITRATE (PF) 250 MCG/5ML IJ SOLN
INTRAMUSCULAR | Status: AC
  Filled 2021-10-11: qty 5

## 2021-10-11 MED ORDER — VANCOMYCIN HCL IN DEXTROSE 1-5 GM/200ML-% IV SOLN
INTRAVENOUS | Status: AC
  Administered 2021-10-11: 1000 mg via INTRAVENOUS
  Filled 2021-10-11: qty 200

## 2021-10-11 MED ORDER — 0.9 % SODIUM CHLORIDE (POUR BTL) OPTIME
TOPICAL | Status: DC | PRN
  Administered 2021-10-11: 500 mL

## 2021-10-11 MED ORDER — ACETAMINOPHEN 10 MG/ML IV SOLN
INTRAVENOUS | Status: DC | PRN
  Administered 2021-10-11: 1000 mg via INTRAVENOUS

## 2021-10-11 MED ORDER — ACETAMINOPHEN 10 MG/ML IV SOLN: 1000.0000 mg | Freq: Once | INTRAVENOUS | Status: DC | PRN

## 2021-10-11 MED ORDER — FENTANYL CITRATE (PF) 100 MCG/2ML IJ SOLN
INTRAMUSCULAR | Status: DC | PRN
  Administered 2021-10-11: 50 ug via INTRAVENOUS
  Administered 2021-10-11: 100 ug via INTRAVENOUS

## 2021-10-11 MED ORDER — VANCOMYCIN HCL IN DEXTROSE 1-5 GM/200ML-% IV SOLN
1000.0000 mg | Freq: Once | INTRAVENOUS | Status: AC
  Administered 2021-10-11: 1000 mg via INTRAVENOUS

## 2021-10-11 MED ORDER — OXYCODONE HCL 5 MG PO TABS: 5.0000 mg | ORAL_TABLET | Freq: Once | ORAL | Status: DC

## 2021-10-11 MED ORDER — PHENYLEPHRINE 80 MCG/ML (10ML) SYRINGE FOR IV PUSH (FOR BLOOD PRESSURE SUPPORT)
PREFILLED_SYRINGE | INTRAVENOUS | Status: DC | PRN
  Administered 2021-10-11: 160 ug via INTRAVENOUS

## 2021-10-11 MED ORDER — OXYCODONE HCL 5 MG PO TABS
ORAL_TABLET | ORAL | Status: AC
  Filled 2021-10-11: qty 1

## 2021-10-11 MED ORDER — ESMOLOL HCL 100 MG/10ML IV SOLN
INTRAVENOUS | Status: AC
  Filled 2021-10-11: qty 10

## 2021-10-11 MED ORDER — ACETAMINOPHEN 10 MG/ML IV SOLN
INTRAVENOUS | Status: AC
  Filled 2021-10-11: qty 100

## 2021-10-11 MED ORDER — KETAMINE HCL 10 MG/ML IJ SOLN
INTRAMUSCULAR | Status: DC | PRN
  Administered 2021-10-11: 30 mg via INTRAVENOUS

## 2021-10-11 MED ORDER — LIDOCAINE HCL (CARDIAC) PF 100 MG/5ML IV SOSY
PREFILLED_SYRINGE | INTRAVENOUS | Status: DC | PRN
  Administered 2021-10-11: 80 mg via INTRAVENOUS

## 2021-10-11 MED ORDER — ROCURONIUM BROMIDE 100 MG/10ML IV SOLN
INTRAVENOUS | Status: DC | PRN
  Administered 2021-10-11: 60 mg via INTRAVENOUS

## 2021-10-11 MED ORDER — PROPOFOL 10 MG/ML IV BOLUS
INTRAVENOUS | Status: AC
  Filled 2021-10-11: qty 20

## 2021-10-11 MED ORDER — SUCCINYLCHOLINE CHLORIDE 200 MG/10ML IV SOSY
PREFILLED_SYRINGE | INTRAVENOUS | Status: AC
  Filled 2021-10-11: qty 10

## 2021-10-11 MED ORDER — ORAL CARE MOUTH RINSE: 15.0000 mL | Freq: Once | OROMUCOSAL | Status: AC

## 2021-10-11 MED ORDER — DEXMEDETOMIDINE HCL IN NACL 80 MCG/20ML IV SOLN
INTRAVENOUS | Status: AC
  Filled 2021-10-11: qty 20

## 2021-10-11 MED ORDER — CHLORHEXIDINE GLUCONATE 0.12 % MT SOLN
OROMUCOSAL | Status: AC
  Administered 2021-10-11: 15 mL via OROMUCOSAL
  Filled 2021-10-11: qty 15

## 2021-10-11 MED ORDER — HYDROCODONE-ACETAMINOPHEN 5-325 MG PO TABS
1.0000 | ORAL_TABLET | ORAL | 0 refills | Status: AC | PRN
  Filled 2021-10-11: qty 16, 3d supply, fill #0

## 2021-10-11 MED ORDER — MIDAZOLAM HCL 2 MG/2ML IJ SOLN
INTRAMUSCULAR | Status: AC
  Filled 2021-10-11: qty 2

## 2021-10-11 MED ORDER — FENTANYL CITRATE (PF) 100 MCG/2ML IJ SOLN
INTRAMUSCULAR | Status: AC
  Administered 2021-10-11: 50 ug via INTRAVENOUS
  Filled 2021-10-11: qty 2

## 2021-10-11 SURGICAL SUPPLY — 55 items
ADH SKN CLS APL DERMABOND .7 (GAUZE/BANDAGES/DRESSINGS) ×1
BAG PRESSURE INF REUSE 1000 (BAG) IMPLANT
BLADE SURG SZ11 CARB STEEL (BLADE) ×2 IMPLANT
CANNULA REDUC XI 12-8 STAPL (CANNULA) ×2
CANNULA REDUCER 12-8 DVNC XI (CANNULA) ×1 IMPLANT
CATH REDDICK CHOLANGI 4FR 50CM (CATHETERS) IMPLANT
CLIP LIGATING HEM O LOK PURPLE (MISCELLANEOUS) IMPLANT
CLIP LIGATING HEMO O LOK GREEN (MISCELLANEOUS) ×2 IMPLANT
DERMABOND ADVANCED (GAUZE/BANDAGES/DRESSINGS) ×2
DERMABOND ADVANCED .7 DNX12 (GAUZE/BANDAGES/DRESSINGS) ×1 IMPLANT
DRAPE ARM DVNC X/XI (DISPOSABLE) ×4 IMPLANT
DRAPE C-ARM XRAY 36X54 (DRAPES) IMPLANT
DRAPE COLUMN DVNC XI (DISPOSABLE) ×1 IMPLANT
DRAPE DA VINCI XI ARM (DISPOSABLE) ×8
DRAPE DA VINCI XI COLUMN (DISPOSABLE) ×2
ELECT REM PT RETURN 9FT ADLT (ELECTROSURGICAL) ×2
ELECTRODE REM PT RTRN 9FT ADLT (ELECTROSURGICAL) ×1 IMPLANT
GLOVE BIO SURGEON STRL SZ 6.5 (GLOVE) ×4 IMPLANT
GLOVE BIOGEL PI IND STRL 6.5 (GLOVE) ×2 IMPLANT
GLOVE BIOGEL PI INDICATOR 6.5 (GLOVE) ×2
GOWN STRL REUS W/ TWL LRG LVL3 (GOWN DISPOSABLE) ×3 IMPLANT
GOWN STRL REUS W/TWL LRG LVL3 (GOWN DISPOSABLE) ×6
GRASPER SUT TROCAR 14GX15 (MISCELLANEOUS) ×2 IMPLANT
IRRIGATOR SUCT 8 DISP DVNC XI (IRRIGATION / IRRIGATOR) IMPLANT
IRRIGATOR SUCTION 8MM XI DISP (IRRIGATION / IRRIGATOR)
IV CATH ANGIO 12GX3 LT BLUE (NEEDLE) IMPLANT
IV NS 1000ML (IV SOLUTION)
IV NS 1000ML BAXH (IV SOLUTION) IMPLANT
KIT PINK PAD W/HEAD ARE REST (MISCELLANEOUS) ×2 IMPLANT
KIT PINK PAD W/HEAD ARM REST (MISCELLANEOUS) ×1 IMPLANT
LABEL OR SOLS (LABEL) ×2 IMPLANT
MANIFOLD NEPTUNE II (INSTRUMENTS) ×2 IMPLANT
NDL INSUFFLATION 14GA 120MM (NEEDLE) ×1 IMPLANT
NEEDLE HYPO 22GX1.5 SAFETY (NEEDLE) ×2 IMPLANT
NEEDLE INSUFFLATION 14GA 120MM (NEEDLE) ×2 IMPLANT
NS IRRIG 500ML POUR BTL (IV SOLUTION) ×2 IMPLANT
OBTURATOR OPTICAL STANDARD 8MM (TROCAR) ×2
OBTURATOR OPTICAL STND 8 DVNC (TROCAR) ×1
OBTURATOR OPTICALSTD 8 DVNC (TROCAR) ×1 IMPLANT
PACK LAP CHOLECYSTECTOMY (MISCELLANEOUS) ×2 IMPLANT
SEAL CANN UNIV 5-8 DVNC XI (MISCELLANEOUS) ×3 IMPLANT
SEAL XI 5MM-8MM UNIVERSAL (MISCELLANEOUS) ×6
SET TUBE SMOKE EVAC HIGH FLOW (TUBING) ×2 IMPLANT
SOLUTION ELECTROLUBE (MISCELLANEOUS) ×2 IMPLANT
SPIKE FLUID TRANSFER (MISCELLANEOUS) ×2 IMPLANT
SPONGE T-LAP 4X18 ~~LOC~~+RFID (SPONGE) IMPLANT
STAPLER CANNULA SEAL DVNC XI (STAPLE) ×1 IMPLANT
STAPLER CANNULA SEAL XI (STAPLE) ×2
SUT MNCRL 4-0 (SUTURE) ×4
SUT MNCRL 4-0 27XMFL (SUTURE) ×2
SUT VICRYL 0 AB UR-6 (SUTURE) ×2 IMPLANT
SUTURE MNCRL 4-0 27XMF (SUTURE) ×1 IMPLANT
SYS BAG RETRIEVAL 10MM (BASKET) ×2
SYSTEM BAG RETRIEVAL 10MM (BASKET) ×1 IMPLANT
WATER STERILE IRR 500ML POUR (IV SOLUTION) ×1 IMPLANT

## 2021-10-11 NOTE — Transfer of Care (Signed)
Immediate Anesthesia Transfer of Care Note  Patient: [name]  Procedure(s) Performed: XI ROBOTIC ASSISTED LAPAROSCOPIC CHOLECYSTECTOMY (Abdomen) INDOCYANINE Edan Juday FLUORESCENCE IMAGING (ICG) (Abdomen)  Patient Location: PACU  Anesthesia Type:General  Level of Consciousness: drowsy  Airway & Oxygen Therapy: Patient Spontanous Breathing and Patient connected to face mask oxygen  Post-op Assessment: Report given to RN, Post -op Vital signs reviewed and stable and Patient moving all extremities  Post vital signs: Reviewed and stable  Last Vitals:  Vitals Value Taken Time  BP 117/79 10/11/21 1652  Temp    Pulse 94 10/11/21 1656  Resp 15 10/11/21 1656  SpO2 100 % 10/11/21 1656  Vitals shown include unvalidated device data.  Last Pain:  Vitals:   10/11/21 1511  TempSrc: Temporal  PainSc: 3          Complications: No notable events documented.

## 2021-10-11 NOTE — Discharge Instructions (Addendum)
  Diet: Resume home heart healthy regular diet.   Activity: No heavy lifting >20 pounds (children, pets, laundry, garbage) or strenuous activity until follow-up, but light activity and walking are encouraged. Do not drive or drink alcohol if taking narcotic pain medications.  Wound care: May shower with soapy water and pat dry (do not rub incisions), but no baths or submerging incision underwater until follow-up. (no swimming)   Medications: Resume all home medications. For mild to moderate pain: acetaminophen (Tylenol) ***or ibuprofen (if no kidney disease). Combining Tylenol with alcohol can substantially increase your risk of causing liver disease. Narcotic pain medications, if prescribed, can be used for severe pain, though may cause nausea, constipation, and drowsiness. Do not combine Tylenol and Norco within a 6 hour period as Norco contains Tylenol. If you do not need the narcotic pain medication, you do not need to fill the prescription.  Call office ([phone]) at any time if any questions, worsening pain, fevers/chills, bleeding, drainage from incision site, or other concerns.   AMBULATORY SURGERY  DISCHARGE INSTRUCTIONS   The drugs that you were given will stay in your system until tomorrow so for the next 24 hours you should not:  Drive an automobile Make any legal decisions Drink any alcoholic beverage   You may resume regular meals tomorrow.  Today it is better to start with liquids and gradually work up to solid foods.  You may eat anything you prefer, but it is better to start with liquids, then soup and crackers, and gradually work up to solid foods.   Please notify your doctor immediately if you have any unusual bleeding, trouble breathing, redness and pain at the surgery site, drainage, fever, or pain not relieved by medication.    Additional Instructions:        Please contact your physician with any problems or Same Day Surgery at [phone], Monday  through Friday 6 am to 4 pm, or [place] [facility] [place] Main number at [phone].  

## 2021-10-11 NOTE — Interval H&P Note (Signed)
History and Physical Interval Note:  10/11/2021 4:59 PM  [name]  has presented today for surgery, with the diagnosis of K81.0 Cholecystitis, acute.  The various methods of treatment have been discussed with the patient and family. After consideration of risks, benefits and other options for treatment, the patient has consented to  Procedure(s): XI ROBOTIC ASSISTED LAPAROSCOPIC CHOLECYSTECTOMY (N/A) INDOCYANINE GREEN FLUORESCENCE IMAGING (ICG) (N/A) as a surgical intervention.  The patient's history has been reviewed, patient examined, no change in status, stable for surgery.  I have reviewed the patient's chart and labs.  Questions were answered to the patient's satisfaction.     Carolan Shiver

## 2021-10-11 NOTE — Anesthesia Procedure Notes (Signed)
Procedure Name: Intubation Date/Time: 10/11/2021 3:41 PM  Performed by: Katherine Basset, CRNAPre-anesthesia Checklist: Patient identified, Emergency Drugs available, Suction available and Patient being monitored Patient Re-evaluated:Patient Re-evaluated prior to induction Oxygen Delivery Method: Circle system utilized Preoxygenation: Pre-oxygenation with 100% oxygen Induction Type: IV induction, Cricoid Pressure applied and Rapid sequence Ventilation: Mask ventilation without difficulty Laryngoscope Size: Miller and 3 Grade View: Grade I Tube type: Oral Tube size: 7.0 mm Number of attempts: 1 Airway Equipment and Method: Stylet, Oral airway and Bite block Placement Confirmation: ETT inserted through vocal cords under direct vision, positive ETCO2 and breath sounds checked- equal and bilateral Secured at: 18 cm Tube secured with: Tape Dental Injury: Teeth and Oropharynx as per pre-operative assessment

## 2021-10-11 NOTE — Anesthesia Preprocedure Evaluation (Addendum)
Anesthesia Evaluation  Patient identified by MRN, date of birth, ID band Patient awake    Reviewed: Allergy & Precautions, NPO status , Patient's Chart, lab work & pertinent test results  History of Anesthesia Complications Negative for: history of anesthetic complications  Airway Mallampati: I   Neck ROM: Full    Dental  (+) Chipped   Pulmonary sleep apnea ,    Pulmonary exam normal breath sounds clear to auscultation       Cardiovascular Exercise Tolerance: Good negative cardio ROS Normal cardiovascular exam Rhythm:Regular Rate:Normal     Neuro/Psych PSYCHIATRIC DISORDERS (ADHD) Anxiety Depression Bipolar Disorder negative neurological ROS     GI/Hepatic GERD  Medicated and Controlled,  Endo/Other  PCOS; obesity  Renal/GU Renal disease (nephrolithiasis)     Musculoskeletal   Abdominal   Peds  Hematology negative hematology ROS (+)   Anesthesia Other Findings   Reproductive/Obstetrics                            Anesthesia Physical Anesthesia Plan  ASA: 2 and emergent  Anesthesia Plan: General   Post-op Pain Management:    Induction: Intravenous and Rapid sequence  PONV Risk Score and Plan: 3 and Ondansetron, Dexamethasone and Treatment may vary due to age or medical condition  Airway Management Planned: Oral ETT  Additional Equipment:   Intra-op Plan:   Post-operative Plan: Extubation in OR  Informed Consent: I have reviewed the patients History and Physical, chart, labs and discussed the procedure including the risks, benefits and alternatives for the proposed anesthesia with the patient or authorized representative who has indicated his/her understanding and acceptance.     Dental advisory given  Plan Discussed with: CRNA  Anesthesia Plan Comments: (Patient consented for risks of anesthesia including but not limited to:  - adverse reactions to medications - damage  to eyes, teeth, lips or other oral mucosa - nerve damage due to positioning  - sore throat or hoarseness - damage to heart, brain, nerves, lungs, other parts of body or loss of life  Informed patient about role of CRNA in peri- and intra-operative care.  Patient voiced understanding.)        Anesthesia Quick Evaluation

## 2021-10-11 NOTE — H&P (View-Only) (Signed)
PATIENT PROFILE: [name] is a 36 y.o. female who presents to the Clinic for consultation at the request of Dr. Pasty Arch for evaluation of cholelithiasis.  PCP:  Norman Herrlich, NP  HISTORY OF PRESENT ILLNESS: [name] reports she has been having epigastric pain for the last week.  This morning the pain was localized in the right upper quadrant.  Then the pain radiated to the epigastric area and to the right shoulder blade.  Pain is severe.  Pain has been getting aggravated by eating.  No alleviating factors.  Patient denies any fever or chills.  She had an ultrasound done that shows cholelithiasis.   PROBLEM LIST: Problem List  Date Reviewed: 12/07/2017          Noted   Chronic interstitial cystitis 11/27/2018   Morbid obesity (CMS-HCC) 12/07/2017   Overview    Has lost 15lbs in the last 58mo and she is now <40BMI        GENERAL REVIEW OF SYSTEMS:   General ROS: negative for - chills, fatigue, fever, weight gain or weight loss Allergy and Immunology ROS: negative for - hives  Hematological and Lymphatic ROS: negative for - bleeding problems or bruising, negative for palpable nodes Endocrine ROS: negative for - heat or cold intolerance, hair changes Respiratory ROS: negative for - cough, shortness of breath or wheezing Cardiovascular ROS: no chest pain or palpitations GI ROS: negative for nausea, vomiting, diarrhea, constipation.  Positive for abdominal pain Musculoskeletal ROS: negative for - joint swelling or muscle pain Neurological ROS: negative for - confusion, syncope Dermatological ROS: negative for pruritus and rash Psychiatric: negative for anxiety, depression, difficulty sleeping and memory loss  MEDICATIONS: Current Outpatient Medications  Medication Sig Dispense Refill   dextroamphetamine-amphetamine (ADDERALL XR) 30 MG XR capsule Take 30 mg by mouth every morning     esomeprazole (NEXIUM) 40 MG DR capsule Take 40 mg by mouth once daily     ondansetron  (ZOFRAN-ODT) 4 MG disintegrating tablet Take by mouth every 8 (eight) hours as needed     spironolactone (ALDACTONE) 100 MG tablet Take 1 tablet (100 mg total) by mouth 2 (two) times daily 180 tablet 3   WEGOVY 1.7 mg/0.75 mL pen injector Inject 1.7 mg subcutaneously once a week     ALPRAZolam (XANAX) 0.5 MG tablet Take 1 tablet (0.5 mg total) by mouth 2 (two) times daily as needed (Anxiety) (Patient not taking: Reported on 10/11/2021) 60 tablet 2   amitriptyline (ELAVIL) 50 MG tablet Take 1 tablet (50 mg total) by mouth nightly 90 tablet 3   cyanocobalamin (VITAMIN B12) 1,000 mcg/mL injection Inject 1 mL (1,000 mcg total) into the muscle monthly (Patient not taking: Reported on 10/11/2021) 3 mL 3   diazePAM (VALIUM) 5 MG tablet Take 2 tablets (10 mg total) by mouth nightly as needed for Sleep (Patient not taking: Reported on 10/11/2021) 30 tablet 5   drospirenone-ethinyl estradioL (YAZ) 3-0.02 mg tablet Take 1 tablet by mouth once daily (Patient not taking: Reported on 10/11/2021) 1 Package 11   ergocalciferol, vitamin D2, 1,250 mcg (50,000 unit) capsule TAKE 1 CAPSULE BY MOUTH ONE TIME PER WEEK (Patient not taking: Reported on 10/11/2021) 4 capsule 5   metFORMIN (GLUCOPHAGE-XR) 500 MG XR tablet Take 1 tablet (500 mg total) by mouth 2 (two) times daily (Patient not taking: Reported on 10/11/2021) 180 tablet 3   phentermine (ADIPEX-P) 37.5 mg tablet Take 1 tablet (37.5 mg total) by mouth every morning before breakfast (Patient not taking: Reported  on 10/11/2021) 90 tablet 3   valACYclovir (VALTREX) 1000 MG tablet TAKE 1 TABLET (1,000 MG TOTAL) BY MOUTH ONCE DAILY (Patient not taking: Reported on 10/11/2021) 30 tablet 2   No current facility-administered medications for this visit.    ALLERGIES: Latex  PAST MEDICAL HISTORY: Past Medical History:  Diagnosis Date   Abnormal cytology 2006, 2009   Nothing abnormal since 2009   Allergy    Latex   Anemia    Depression    GERD (gastroesophageal reflux disease)  2003   History of abnormal cervical Pap smear 2014   LGSIL HPV +   History of blood transfusion    IC (interstitial cystitis)    Sleep apnea    Vitamin B12 deficiency     PAST SURGICAL HISTORY: Past Surgical History:  Procedure Laterality Date   KNEE ARTHROSCOPY  2009   CERVICAL BIOPSY  W/ LOOP ELECTRODE EXCISION     CESAREAN SECTION     COLPOSCOPY     DILATION AND CURETTAGE OF UTERUS     kidney stone removal     knee surgery Right    PELVIC LAPAROSCOPY     to diagnosis IC   TONSILLECTOMY     TUBAL LIGATION       FAMILY HISTORY: Family History  Problem Relation Age of Onset   Heart disease Father    Hyperlipidemia (Elevated cholesterol) Father    High blood pressure (Hypertension) Father    High blood pressure (Hypertension) Mother    Diabetes Paternal Grandmother    Heart disease Paternal Grandmother    Diabetes Paternal Grandfather    Heart disease Paternal Grandfather      SOCIAL HISTORY: Social History   Socioeconomic History   Marital status: Married  Occupational History   Occupation: Payroll  Tobacco Use   Smoking status: Never   Smokeless tobacco: Never  Vaping Use   Vaping Use: Never used  Substance and Sexual Activity   Alcohol use: Yes    Comment: 4 or 5 times a year   Drug use: No   Sexual activity: Yes    Partners: Male    Birth control/protection: Surgical    PHYSICAL EXAM: Vitals:   10/11/21 1246  BP: (!) 136/98  Pulse: 107   Body mass index is 38.39 kg/m.     GENERAL: Alert, active, oriented x3  HEENT: Pupils equal reactive to light. Extraocular movements are intact. Sclera clear. Palpebral conjunctiva normal red color.Pharynx clear.  NECK: Supple with no palpable mass and no adenopathy.  LUNGS: Sound clear with no rales rhonchi or wheezes.  HEART: Regular rhythm S1 and S2 without murmur.  ABDOMEN: Soft and depressible, nontender with no palpable mass, no hepatomegaly.   EXTREMITIES: Well-developed well-nourished  symmetrical with no dependent edema.  NEUROLOGICAL: Awake alert oriented, facial expression symmetrical, moving all extremities.  REVIEW OF DATA: I have reviewed the following data today: Appointment on 10/11/2021  Component Date Value   WBC (White Blood Cell Co* 10/11/2021 7.0    RBC (Red Blood Cell Coun* 10/11/2021 4.78    Hemoglobin 10/11/2021 15.4 (H)    Hematocrit 10/11/2021 44.8    MCV (Mean Corpuscular Vo* 10/11/2021 93.7    MCH (Mean Corpuscular He* 10/11/2021 32.2 (H)    MCHC (Mean Corpuscular H* 10/11/2021 34.4    Platelet Count 10/11/2021 393    RDW-CV (Red Cell Distrib* 10/11/2021 12.4    MPV (Mean Platelet Volum* 10/11/2021 9.0 (L)    Neutrophils 10/11/2021 4.23    Lymphocytes 10/11/2021  2.20    Monocytes 10/11/2021 0.41    Eosinophils 10/11/2021 0.07    Basophils 10/11/2021 0.04    Neutrophil % 10/11/2021 60.8    Lymphocyte % 10/11/2021 31.6    Monocyte % 10/11/2021 5.9    Eosinophil % 10/11/2021 1.0    Basophil% 10/11/2021 0.6    Immature Granulocyte % 10/11/2021 0.1    Immature Granulocyte Cou* 10/11/2021 0.01    Glucose 10/11/2021 103    Sodium 10/11/2021 136    Potassium 10/11/2021 4.4    Chloride 10/11/2021 103    Carbon Dioxide (CO2) 10/11/2021 27.8    Urea Nitrogen (BUN) 10/11/2021 12    Creatinine 10/11/2021 0.9    Glomerular Filtration Ra* 10/11/2021 70    Calcium 10/11/2021 9.8    AST  10/11/2021 13    ALT  10/11/2021 15    Alk Phos (alkaline Phosp* 10/11/2021 81    Albumin 10/11/2021 4.6    Bilirubin, Total 10/11/2021 0.6    Protein, Total 10/11/2021 7.5    A/G Ratio 10/11/2021 1.6      ASSESSMENT: Ms. Flannery is a 36 y.o. female presenting for consultation for cholelithiasis with acute cholecystitis.    Patient was oriented about the diagnosis of cholelithiasis. Also oriented about what is the gallbladder, its anatomy and function and the implications of having stones. The patient was oriented about the treatment alternatives (observation vs  cholecystectomy). Patient was oriented that a low percentage of patient will continue to have similar pain symptoms even after the gallbladder is removed. Surgical technique (open vs laparoscopic) was discussed. It was also discussed the goals of the surgery (decrease the pain episodes and avoid the risk of cholecystitis) and the risk of surgery including: bleeding, infection, common bile duct injury, stone retention, injury to other organs such as bowel, liver, stomach, other complications such as hernia, bowel obstruction among others. Also discussed with patient about anesthesia and its complications such as: reaction to medications, pneumonia, heart complications, death, among others.   Due to the severe pain not improving today patient was probably having acute cholecystitis without significant changes on the ultrasound yet.  Patient is n.p.o.  I do recommend to proceed with cholecystectomy due to pain not improving since this morning.  Cholecystitis, acute [K81.0]  PLAN: Robotic assisted laparoscopic cholecystectomy  Patient verbalized understanding, all questions were answered, and were agreeable with the plan outlined above.     Herbert Pun, MD  Electronically signed by Herbert Pun, MD

## 2021-10-11 NOTE — Anesthesia Postprocedure Evaluation (Signed)
Anesthesia Post Note  Patient: [name]  Procedure(s) Performed: XI ROBOTIC ASSISTED LAPAROSCOPIC CHOLECYSTECTOMY (Abdomen) INDOCYANINE GREEN FLUORESCENCE IMAGING (ICG) (Abdomen)  Patient location during evaluation: PACU Anesthesia Type: General Level of consciousness: awake and alert, oriented and patient cooperative Pain management: pain level controlled Vital Signs Assessment: post-procedure vital signs reviewed and stable Respiratory status: spontaneous breathing, nonlabored ventilation and respiratory function stable Cardiovascular status: blood pressure returned to baseline and stable Postop Assessment: adequate PO intake Anesthetic complications: no   No notable events documented.   Last Vitals:  Vitals:   10/11/21 1751 10/11/21 1752  BP:  (!) 100/56  Pulse: 76   Resp: 16   Temp: (!) 36.4 C   SpO2: 98%     Last Pain:  Vitals:   10/11/21 1751  TempSrc: Temporal  PainSc: 4                  Reed Breech

## 2021-10-11 NOTE — H&P (Signed)
PATIENT PROFILE: [name] is a 36 y.o. female who presents to the Clinic for consultation at the request of Dr. Pasty Arch for evaluation of cholelithiasis.  PCP:  Norman Herrlich, NP  HISTORY OF PRESENT ILLNESS: [name] reports she has been having epigastric pain for the last week.  This morning the pain was localized in the right upper quadrant.  Then the pain radiated to the epigastric area and to the right shoulder blade.  Pain is severe.  Pain has been getting aggravated by eating.  No alleviating factors.  Patient denies any fever or chills.  She had an ultrasound done that shows cholelithiasis.   PROBLEM LIST: Problem List  Date Reviewed: 12/07/2017          Noted   Chronic interstitial cystitis 11/27/2018   Morbid obesity (CMS-HCC) 12/07/2017   Overview    Has lost 15lbs in the last 49mo and she is now <40BMI        GENERAL REVIEW OF SYSTEMS:   General ROS: negative for - chills, fatigue, fever, weight gain or weight loss Allergy and Immunology ROS: negative for - hives  Hematological and Lymphatic ROS: negative for - bleeding problems or bruising, negative for palpable nodes Endocrine ROS: negative for - heat or cold intolerance, hair changes Respiratory ROS: negative for - cough, shortness of breath or wheezing Cardiovascular ROS: no chest pain or palpitations GI ROS: negative for nausea, vomiting, diarrhea, constipation.  Positive for abdominal pain Musculoskeletal ROS: negative for - joint swelling or muscle pain Neurological ROS: negative for - confusion, syncope Dermatological ROS: negative for pruritus and rash Psychiatric: negative for anxiety, depression, difficulty sleeping and memory loss  MEDICATIONS: Current Outpatient Medications  Medication Sig Dispense Refill   dextroamphetamine-amphetamine (ADDERALL XR) 30 MG XR capsule Take 30 mg by mouth every morning     esomeprazole (NEXIUM) 40 MG DR capsule Take 40 mg by mouth once daily     ondansetron  (ZOFRAN-ODT) 4 MG disintegrating tablet Take by mouth every 8 (eight) hours as needed     spironolactone (ALDACTONE) 100 MG tablet Take 1 tablet (100 mg total) by mouth 2 (two) times daily 180 tablet 3   WEGOVY 1.7 mg/0.75 mL pen injector Inject 1.7 mg subcutaneously once a week     ALPRAZolam (XANAX) 0.5 MG tablet Take 1 tablet (0.5 mg total) by mouth 2 (two) times daily as needed (Anxiety) (Patient not taking: Reported on 10/11/2021) 60 tablet 2   amitriptyline (ELAVIL) 50 MG tablet Take 1 tablet (50 mg total) by mouth nightly 90 tablet 3   cyanocobalamin (VITAMIN B12) 1,000 mcg/mL injection Inject 1 mL (1,000 mcg total) into the muscle monthly (Patient not taking: Reported on 10/11/2021) 3 mL 3   diazePAM (VALIUM) 5 MG tablet Take 2 tablets (10 mg total) by mouth nightly as needed for Sleep (Patient not taking: Reported on 10/11/2021) 30 tablet 5   drospirenone-ethinyl estradioL (YAZ) 3-0.02 mg tablet Take 1 tablet by mouth once daily (Patient not taking: Reported on 10/11/2021) 1 Package 11   ergocalciferol, vitamin D2, 1,250 mcg (50,000 unit) capsule TAKE 1 CAPSULE BY MOUTH ONE TIME PER WEEK (Patient not taking: Reported on 10/11/2021) 4 capsule 5   metFORMIN (GLUCOPHAGE-XR) 500 MG XR tablet Take 1 tablet (500 mg total) by mouth 2 (two) times daily (Patient not taking: Reported on 10/11/2021) 180 tablet 3   phentermine (ADIPEX-P) 37.5 mg tablet Take 1 tablet (37.5 mg total) by mouth every morning before breakfast (Patient not taking: Reported  on 10/11/2021) 90 tablet 3   valACYclovir (VALTREX) 1000 MG tablet TAKE 1 TABLET (1,000 MG TOTAL) BY MOUTH ONCE DAILY (Patient not taking: Reported on 10/11/2021) 30 tablet 2   No current facility-administered medications for this visit.    ALLERGIES: Latex  PAST MEDICAL HISTORY: Past Medical History:  Diagnosis Date   Abnormal cytology 2006, 2009   Nothing abnormal since 2009   Allergy    Latex   Anemia    Depression    GERD (gastroesophageal reflux disease)  2003   History of abnormal cervical Pap smear 2014   LGSIL HPV +   History of blood transfusion    IC (interstitial cystitis)    Sleep apnea    Vitamin B12 deficiency     PAST SURGICAL HISTORY: Past Surgical History:  Procedure Laterality Date   KNEE ARTHROSCOPY  2009   CERVICAL BIOPSY  W/ LOOP ELECTRODE EXCISION     CESAREAN SECTION     COLPOSCOPY     DILATION AND CURETTAGE OF UTERUS     kidney stone removal     knee surgery Right    PELVIC LAPAROSCOPY     to diagnosis IC   TONSILLECTOMY     TUBAL LIGATION       FAMILY HISTORY: Family History  Problem Relation Age of Onset   Heart disease Father    Hyperlipidemia (Elevated cholesterol) Father    High blood pressure (Hypertension) Father    High blood pressure (Hypertension) Mother    Diabetes Paternal Grandmother    Heart disease Paternal Grandmother    Diabetes Paternal Grandfather    Heart disease Paternal Grandfather      SOCIAL HISTORY: Social History   Socioeconomic History   Marital status: Married  Occupational History   Occupation: Payroll  Tobacco Use   Smoking status: Never   Smokeless tobacco: Never  Vaping Use   Vaping Use: Never used  Substance and Sexual Activity   Alcohol use: Yes    Comment: 4 or 5 times a year   Drug use: No   Sexual activity: Yes    Partners: Male    Birth control/protection: Surgical    PHYSICAL EXAM: Vitals:   10/11/21 1246  BP: (!) 136/98  Pulse: 107   Body mass index is 38.39 kg/m.     GENERAL: Alert, active, oriented x3  HEENT: Pupils equal reactive to light. Extraocular movements are intact. Sclera clear. Palpebral conjunctiva normal red color.Pharynx clear.  NECK: Supple with no palpable mass and no adenopathy.  LUNGS: Sound clear with no rales rhonchi or wheezes.  HEART: Regular rhythm S1 and S2 without murmur.  ABDOMEN: Soft and depressible, nontender with no palpable mass, no hepatomegaly.   EXTREMITIES: Well-developed well-nourished  symmetrical with no dependent edema.  NEUROLOGICAL: Awake alert oriented, facial expression symmetrical, moving all extremities.  REVIEW OF DATA: I have reviewed the following data today: Appointment on 10/11/2021  Component Date Value   WBC (White Blood Cell Co* 10/11/2021 7.0    RBC (Red Blood Cell Coun* 10/11/2021 4.78    Hemoglobin 10/11/2021 15.4 (H)    Hematocrit 10/11/2021 44.8    MCV (Mean Corpuscular Vo* 10/11/2021 93.7    MCH (Mean Corpuscular He* 10/11/2021 32.2 (H)    MCHC (Mean Corpuscular H* 10/11/2021 34.4    Platelet Count 10/11/2021 393    RDW-CV (Red Cell Distrib* 10/11/2021 12.4    MPV (Mean Platelet Volum* 10/11/2021 9.0 (L)    Neutrophils 10/11/2021 4.23    Lymphocytes 10/11/2021  2.20    Monocytes 10/11/2021 0.41    Eosinophils 10/11/2021 0.07    Basophils 10/11/2021 0.04    Neutrophil % 10/11/2021 60.8    Lymphocyte % 10/11/2021 31.6    Monocyte % 10/11/2021 5.9    Eosinophil % 10/11/2021 1.0    Basophil% 10/11/2021 0.6    Immature Granulocyte % 10/11/2021 0.1    Immature Granulocyte Cou* 10/11/2021 0.01    Glucose 10/11/2021 103    Sodium 10/11/2021 136    Potassium 10/11/2021 4.4    Chloride 10/11/2021 103    Carbon Dioxide (CO2) 10/11/2021 27.8    Urea Nitrogen (BUN) 10/11/2021 12    Creatinine 10/11/2021 0.9    Glomerular Filtration Ra* 10/11/2021 70    Calcium 10/11/2021 9.8    AST  10/11/2021 13    ALT  10/11/2021 15    Alk Phos (alkaline Phosp* 10/11/2021 81    Albumin 10/11/2021 4.6    Bilirubin, Total 10/11/2021 0.6    Protein, Total 10/11/2021 7.5    A/G Ratio 10/11/2021 1.6      ASSESSMENT: Ms. Cheramie is a 36 y.o. female presenting for consultation for cholelithiasis with acute cholecystitis.    Patient was oriented about the diagnosis of cholelithiasis. Also oriented about what is the gallbladder, its anatomy and function and the implications of having stones. The patient was oriented about the treatment alternatives (observation vs  cholecystectomy). Patient was oriented that a low percentage of patient will continue to have similar pain symptoms even after the gallbladder is removed. Surgical technique (open vs laparoscopic) was discussed. It was also discussed the goals of the surgery (decrease the pain episodes and avoid the risk of cholecystitis) and the risk of surgery including: bleeding, infection, common bile duct injury, stone retention, injury to other organs such as bowel, liver, stomach, other complications such as hernia, bowel obstruction among others. Also discussed with patient about anesthesia and its complications such as: reaction to medications, pneumonia, heart complications, death, among others.   Due to the severe pain not improving today patient was probably having acute cholecystitis without significant changes on the ultrasound yet.  Patient is n.p.o.  I do recommend to proceed with cholecystectomy due to pain not improving since this morning.  Cholecystitis, acute [K81.0]  PLAN: Robotic assisted laparoscopic cholecystectomy  Patient verbalized understanding, all questions were answered, and were agreeable with the plan outlined above.     Herbert Pun, MD  Electronically signed by Herbert Pun, MD

## 2021-10-11 NOTE — Op Note (Signed)
Preoperative diagnosis: Acute cholecystitis  Postoperative diagnosis: Same  Procedure: Robotic Assisted Laparoscopic Cholecystectomy.   Anesthesia: GETA   Surgeon: Dr. Hazle Quant  Wound Classification: Clean Contaminated  Indications: Patient is a 36 y.o. female developed right upper quadrant pain and on workup was found to have cholelithiasis with a normal common duct.  Since pain did not improve, Robotic Assisted Laparoscopic cholecystectomy was elected.  Findings:  Critical view of safety achieved Cystic duct and artery identified, ligated and divided Adequate hemostasis             Description of procedure: The patient was placed on the operating table in the supine position. General anesthesia was induced. A time-out was completed verifying correct patient, procedure, site, positioning, and implant(s) and/or special equipment prior to beginning this procedure. An orogastric tube was placed. The abdomen was prepped and draped in the usual sterile fashion.  An incision was made in a natural skin line below the umbilicus.  The fascia was elevated and the Veress needle inserted. Proper position was confirmed by aspiration and saline meniscus test.  The abdomen was insufflated with carbon dioxide to a pressure of 15 mmHg. The patient tolerated insufflation well. A 8-mm trocar was then inserted in optiview fashion.  The laparoscope was inserted and the abdomen inspected. No injuries from initial trocar placement were noted. Additional trocars were then inserted in the following locations: an 8-mm trocar in the left lateral abdomen, and another two 8-mm trocars to the right side of the abdomen 5 cm appart. The umbilical trocar was changed to a 12 mm trocar all under direct visualization. The abdomen was inspected and no abnormalities were found. The table was placed in the reverse Trendelenburg position with the right side up. The robotic arms were docked and target anatomy  identified. Instrument inserted under direct visualization.  Filmy adhesions between the gallbladder and omentum, duodenum and transverse colon were lysed with electrocautery. The dome of the gallbladder was grasped with a prograsp and retracted over the dome of the liver. The infundibulum was also grasped with an atraumatic grasper and retracted toward the right lower quadrant. This maneuver exposed Calot's triangle. The peritoneum overlying the gallbladder infundibulum was then incised and the cystic duct and cystic artery identified and circumferentially dissected. Critical view of safety reviewed before ligating any structure. Firefly images taken to visualize biliary ducts. The cystic duct and cystic artery were then doubly clipped and divided close to the gallbladder.  The gallbladder was then dissected from its peritoneal attachments by electrocautery. Hemostasis was checked and the gallbladder and contained stones were removed using an endoscopic retrieval bag. The gallbladder was passed off the table as a specimen.  There was no evidence of bleeding from the gallbladder fossa or cystic artery or leakage of the bile from the cystic duct stump. Secondary trocars were removed under direct vision. No bleeding was noted. The robotic arms were undoked. The scope was withdrawn and the umbilical trocar removed. The abdomen was allowed to collapse. The fascia of the 62mm trocar sites was closed with figure-of-eight 0 vicryl sutures. The skin was closed with subcuticular sutures of 4-0 monocryl and topical skin adhesive. The orogastric tube was removed.  The patient tolerated the procedure well and was taken to the postanesthesia care unit in stable condition.   Specimen: Gallbladder  Complications: None  EBL: 5 mL

## 2021-10-12 ENCOUNTER — Other Ambulatory Visit: Payer: Self-pay

## 2021-10-12 HISTORY — PX: CHOLECYSTECTOMY: SHX55

## 2021-10-12 MED ORDER — ONDANSETRON 4 MG PO TBDP
ORAL_TABLET | ORAL | 0 refills | Status: DC
  Filled 2021-10-12: qty 20, 7d supply, fill #0

## 2021-10-12 MED ORDER — TRAMADOL HCL 50 MG PO TABS
ORAL_TABLET | ORAL | 0 refills | Status: DC
  Filled 2021-10-12: qty 20, 5d supply, fill #0

## 2021-10-13 LAB — SURGICAL PATHOLOGY

## 2021-10-18 ENCOUNTER — Ambulatory Visit
Admission: RE | Admit: 2021-10-18 | Discharge: 2021-10-18 | Disposition: A | Discharge status: Home / Self Care | Payer: Managed Care, Other (non HMO) | Source: Ambulatory Visit | Attending: General Surgery | Admitting: General Surgery

## 2021-10-18 ENCOUNTER — Other Ambulatory Visit: Payer: Self-pay | Admitting: General Surgery

## 2021-10-18 DIAGNOSIS — Z9889 Other specified postprocedural states: Secondary | ICD-10-CM

## 2021-10-18 DIAGNOSIS — R1032 Left lower quadrant pain: Secondary | ICD-10-CM

## 2021-10-18 DIAGNOSIS — R112 Nausea with vomiting, unspecified: Secondary | ICD-10-CM | POA: Diagnosis not present

## 2021-10-18 MED ORDER — IOHEXOL 300 MG/ML  SOLN
100.0000 mL | Freq: Once | INTRAMUSCULAR | Status: AC | PRN
  Administered 2021-10-18: 100 mL via INTRAVENOUS

## 2021-11-15 ENCOUNTER — Other Ambulatory Visit: Payer: Self-pay | Admitting: Nurse Practitioner

## 2021-11-15 ENCOUNTER — Ambulatory Visit: Payer: 59 | Admitting: Nurse Practitioner

## 2021-11-15 NOTE — Telephone Encounter (Signed)
Requested medications are due for refill today.  A little too soon  Requested medications are on the active medications list.  yes  Last refill. 10/28/2021 #30 0 refills  Future visit scheduled.   yes  Notes to clinic.  Refill is not delegated.    Requested Prescriptions  Pending Prescriptions Disp Refills   amphetamine-dextroamphetamine (ADDERALL XR) 30 MG 24 hr capsule [Pharmacy Med Name: AMPHETAMINE-DEXTROAMPHET ER 30 MG C] 30 capsule     Sig: TAKE 1 CAPSULE BY MOUTH EVERY MORNING     Not Delegated - Psychiatry:  Stimulants/ADHD Failed - 11/15/2021 12:33 PM      Failed - This refill cannot be delegated      Passed - Urine Drug Screen completed in last 360 days      Passed - Last BP in normal range    BP Readings from Last 1 Encounters:  10/11/21 (!) 100/56         Passed - Last Heart Rate in normal range    Pulse Readings from Last 1 Encounters:  10/11/21 76         Passed - Valid encounter within last 6 months    Recent Outpatient Visits           3 months ago Attention deficit hyperactivity disorder (ADHD), combined type   [facility] [place], Jolene T, NP   4 months ago Class 2 obesity due to excess calories without serious comorbidity with body mass index (BMI) of 37.0 to 37.9 in adult   [facility], [name] T, NP   6 months ago Attention deficit hyperactivity disorder (ADHD), combined type   [facility], Dorie Rank, NP   9 months ago Attention deficit hyperactivity disorder (ADHD), combined type   [facility] [place], Jolene T, NP   10 months ago Atopic dermatitis of eyelid, unspecified laterality   [facility] [place], Dorie Rank, NP       Future Appointments             In 2 weeks Cannady, Dorie Rank, NP [facility]

## 2021-11-23 ENCOUNTER — Telehealth: Payer: Self-pay | Admitting: Urology

## 2021-11-23 ENCOUNTER — Other Ambulatory Visit: Payer: Self-pay

## 2021-11-23 ENCOUNTER — Ambulatory Visit
Admission: RE | Admit: 2021-11-23 | Discharge: 2021-11-23 | Disposition: A | Discharge status: Home / Self Care | Payer: 59 | Source: Ambulatory Visit | Attending: Urology | Admitting: Urology

## 2021-11-23 ENCOUNTER — Encounter: Payer: Self-pay | Admitting: Urology

## 2021-11-23 ENCOUNTER — Ambulatory Visit (INDEPENDENT_AMBULATORY_CARE_PROVIDER_SITE_OTHER): Payer: 59 | Admitting: Urology

## 2021-11-23 VITALS — BP 122/84 | HR 102 | Temp 98.0°F | Ht 62.0 in | Wt 170.0 lb

## 2021-11-23 DIAGNOSIS — N200 Calculus of kidney: Secondary | ICD-10-CM

## 2021-11-23 DIAGNOSIS — R109 Unspecified abdominal pain: Secondary | ICD-10-CM

## 2021-11-23 DIAGNOSIS — R8271 Bacteriuria: Secondary | ICD-10-CM

## 2021-11-23 LAB — URINALYSIS, COMPLETE
Bilirubin, UA: NEGATIVE
Glucose, UA: NEGATIVE
Ketones, UA: NEGATIVE
Leukocytes,UA: NEGATIVE
Nitrite, UA: NEGATIVE
Protein,UA: NEGATIVE
RBC, UA: NEGATIVE
Specific Gravity, UA: 1.01 (ref 1.005–1.030)
Urobilinogen, Ur: 0.2 mg/dL (ref 0.2–1.0)
pH, UA: 6.5 (ref 5.0–7.5)

## 2021-11-23 LAB — MICROSCOPIC EXAMINATION

## 2021-11-23 MED ORDER — SULFAMETHOXAZOLE-TRIMETHOPRIM 800-160 MG PO TABS: 1.0000 | ORAL_TABLET | Freq: Two times a day (BID) | ORAL | 0 refills | Status: DC

## 2021-11-23 MED ORDER — TAMSULOSIN HCL 0.4 MG PO CAPS: 0.4000 mg | ORAL_CAPSULE | Freq: Every day | ORAL | 0 refills | Status: DC

## 2021-11-23 NOTE — Progress Notes (Signed)
11/23/2021 4:21 PM   [name] Allyn Kenner May 10, 1985 [id number]  Referring provider: Marjie Skiff, NP [address] [place],  [place] [address]  Urological history: 1. Nephrolithiasis History of spontaneous passage of stones  2. rUTI's -contributing factors of  -documented positive urine cultures over the last year  None  Chief Complaint  Patient presents with   Nephrolithiasis    HPI: [name] is a 36 y.o. female who presents today for possible stone.   She says that she has been having bilateral back pain for the last several months.  She was diagnosed with cholecystitis and has since had her gallbladder removed, but the nausea remains.  3 weeks ago she started to experience right greater than left back pain.  She states the pain does not radiate.  It is very intense and she states it feels swollen.  She denies fever or chills.  About 1 week and a half ago, she did have episodes of gross hematuria.  UA with bacteruria  KUB 2 mm right lower pole stone and a possible 1 mm right proximal stone at the level of L2.   CT 10/2021 bilateral punctate stones, no hydro.    PMH: Past Medical History:  Diagnosis Date   ADHD (attention deficit hyperactivity disorder)    Allergy    Anxiety    Bipolar 2 disorder (HCC)    Blood transfusion as the cause of abnormal reaction of patient, or of later complication, without misadventure at the time of procedure    lost to much blood during delivery   Depression    Interstitial cystitis    Kidney stones    PCOS (polycystic ovarian syndrome)    Sleep apnea     Surgical History: Past Surgical History:  Procedure Laterality Date   interstitial cystitis laparoscopy     KIDNEY STONE SURGERY     basket, lithotripsy   KNEE ARTHROSCOPY Right    TONSILLECTOMY AND ADENOIDECTOMY     TUBAL LIGATION      Home Medications:  Allergies as of 11/23/2021       Reactions   Latex Anaphylaxis   Penicillins Nausea And Vomiting         Medication List        Accurate as of November 23, 2021  4:21 PM. If you have any questions, ask your nurse or doctor.          amphetamine-dextroamphetamine 30 MG 24 hr capsule Commonly known as: ADDERALL XR Take 1 capsule (30 mg total) by mouth every morning.   amphetamine-dextroamphetamine 30 MG 24 hr capsule Commonly known as: ADDERALL XR Take 1 capsule (30 mg total) by mouth every morning.   amphetamine-dextroamphetamine 30 MG 24 hr capsule Commonly known as: ADDERALL XR TAKE 1 CAPSULE BY MOUTH EVERY MORNING   cetirizine 5 MG tablet Commonly known as: ZYRTEC Take 5 mg by mouth daily.   Cholecalciferol 1.25 MG (50000 UT) Tabs Take 1 tablet by mouth once a week.   esomeprazole 20 MG capsule Commonly known as: NEXIUM Take 20 mg by mouth daily at 12 noon.   ofloxacin 0.3 % ophthalmic solution Commonly known as: OCUFLOX Place 1 drop into the right eye 4 (four) times daily.   ondansetron 4 MG disintegrating tablet Commonly known as: ZOFRAN-ODT Dissolve 1 tablet (4 mg total) in the mouth every 8 (eight) hours as needed (Take 1 tablet (4 mg total) by mouth every 8 (eight) hours as needed)   spironolactone 100 MG tablet  Commonly known as: Aldactone Take 1 tablet (100 mg total) by mouth 2 (two) times daily.   sulfamethoxazole-trimethoprim 800-160 MG tablet Commonly known as: BACTRIM DS Take 1 tablet by mouth every 12 (twelve) hours. Started by: Michiel Cowboy, PA-C   tamsulosin 0.4 MG Caps capsule Commonly known as: FLOMAX Take 1 capsule (0.4 mg total) by mouth daily. Started by: Michiel Cowboy, PA-C   traMADol 50 MG tablet Commonly known as: ULTRAM Take 1 tablet (50 mg total) by mouth every 6 (six) hours as needed for Pain for up to 5 days (Take 1 tablet (50 mg total) by mouth every 6 (six) hours as needed for Pain for up to 5 days)   valACYclovir 1000 MG tablet Commonly known as: VALTREX Take 1 tablet (1,000 mg total) by mouth 2 (two) times daily for  10 days, then stop.  May repeat as needed for flares.   Wegovy 1.7 MG/0.75ML Soaj Generic drug: Semaglutide-Weight Management Inject 1.7 mg into the skin once a week.        Allergies:  Allergies  Allergen Reactions   Latex Anaphylaxis   Penicillins Nausea And Vomiting    Family History: Family History  Problem Relation Age of Onset   Hyperlipidemia Mother    Hypertension Mother    Hypertension Father    Hyperlipidemia Father    Hyperlipidemia Sister    Hyperlipidemia Maternal Grandfather    Hypertension Maternal Grandfather    Hypertension Paternal Grandmother    Hyperlipidemia Paternal Grandmother    Hyperlipidemia Paternal Grandfather    Hypertension Paternal Grandfather     Social History:  reports that she has never smoked. She has never used smokeless tobacco. She reports current alcohol use. She reports that she does not use drugs.  ROS: Pertinent ROS in HPI  Physical Exam: BP 122/84   Pulse (!) 102   Temp 98 F (36.7 C) (Oral)   Ht 5\' 2"  (1.575 m)   Wt 170 lb (77.1 kg)   BMI 31.09 kg/m   Constitutional:  Well nourished. Alert and oriented, No acute distress. HEENT: [place] AT, moist mucus membranes.  Trachea midline Cardiovascular: No clubbing, cyanosis, or edema. Respiratory: Normal respiratory effort, no increased work of breathing. Neurologic: Grossly intact, no focal deficits, moving all 4 extremities. Psychiatric: Normal mood and affect.    Laboratory Data: Lab Results  Component Value Date   WBC 4.4 08/15/2021   HGB 14.4 08/15/2021   HCT 43.4 08/15/2021   MCV 95 08/15/2021   PLT 378 08/15/2021    Lab Results  Component Value Date   CREATININE 0.89 08/15/2021    Lab Results  Component Value Date   HGBA1C 5.0 08/15/2021    Lab Results  Component Value Date   TSH 1.910 08/15/2021       Component Value Date/Time   CHOL 140 08/15/2021 1149   HDL 46 08/15/2021 1149   CHOLHDL 2.9 Ratio 05/07/2008 2058   VLDL 21 05/07/2008 2058    LDLCALC 80 08/15/2021 1149    Lab Results  Component Value Date   AST 29 08/15/2021   Lab Results  Component Value Date   ALT 54 (H) 08/15/2021    Urinalysis See Epic and HPI I have reviewed the labs.   Pertinent Imaging: CLINICAL DATA:  Nausea and vomiting.  Recent cholecystectomy   EXAM: CT ABDOMEN AND PELVIS WITH CONTRAST   TECHNIQUE: Multidetector CT imaging of the abdomen and pelvis was performed using the standard protocol following bolus administration of intravenous contrast.  RADIATION DOSE REDUCTION: This exam was performed according to the departmental dose-optimization program which includes automated exposure control, adjustment of the mA and/or kV according to patient size and/or use of iterative reconstruction technique.   CONTRAST:  OMNIPAQUE IOHEXOL 300 MG/ML  SOLN   COMPARISON:  CT 10/08/2018.  Ultrasound 10/11/2021   FINDINGS: Lower chest: No acute abnormality.   Hepatobiliary: No focal liver abnormality. Nonvisualization of the gallbladder compatible with history of recent cholecystectomy. No abnormal fluid collection within the gallbladder fossa. No intrahepatic biliary dilatation.   Pancreas: Unremarkable. No pancreatic ductal dilatation or surrounding inflammatory changes.   Spleen: Normal in size without focal abnormality.   Adrenals/Urinary Tract: Unremarkable adrenal glands. There are a few punctate 1-2 mm nonobstructing stones in each kidney. Kidneys enhance symmetrically without focal lesion or hydronephrosis. Ureters are nondilated. Urinary bladder appears unremarkable for the degree of distension.   Stomach/Bowel: Stomach within normal limits. There are several fluid-filled, nondilated loops of distal small bowel. Liquid stool throughout the ascending and transverse colon. No focal bowel wall thickening or inflammatory changes. Appendix not definitively visualized. No evidence to suggest appendicitis.    Vascular/Lymphatic: No significant vascular findings are present. No enlarged abdominal or pelvic lymph nodes.   Reproductive: Unremarkable uterus. A tubal ligation clip is again seen within the pelvic cul-de-sac, unchanged. No adnexal masses.   Other: No free fluid. No abdominopelvic fluid collection. No pneumoperitoneum. No abdominal wall hernia.   Musculoskeletal: Postsurgical changes to the anterior abdominal wall reflective of laparoscopic port sites. No fluid collection or hematoma. No acute bony findings.   IMPRESSION: 1. Status post recent cholecystectomy without evidence of complication. 2. Fluid-filled, nondilated loops of distal small bowel and liquid stool throughout the ascending and transverse colon, suggestive of a nonspecific enterocolitis/diarrheal state. 3. Bilateral punctate nonobstructing nephrolithiasis.     Electronically Signed   By: Duanne Guess D.O.   On: 10/18/2021 11:06 I have independently reviewed the films.  See HPI.    Assessment & Plan:    1. Bilateral flank pain -Right greater than left flank pain for the last several weeks accompanied with gross hematuria -KUB today may demonstrate a 1 mm right proximal stone -She would prefer to undergo some definitive treatment for her stones and so we will pursue a stat CT renal stone study for further evaluation and management of her stones -Prescribed tamsulosin 0.4 mg daily  2. Suspected UTI -UA w/bacteruria  -urine sent for culture -Septra DS sent to pharmacy   Return for pending CT results .  These notes generated with voice recognition software. I apologize for typographical errors.  Michiel Cowboy, PA-C  [facility] [address]  [address] [place], [place] [address] [phone]

## 2021-11-23 NOTE — Telephone Encounter (Signed)
Received a message that this patient would like to be evaluated for kidney stones.  She is a established patient of our practice.  Please help her schedule an appointment with any provider.  KUB prior to the appointment may be helpful.  Vanna Scotland, MD

## 2021-11-27 ENCOUNTER — Other Ambulatory Visit: Payer: 59

## 2021-11-27 ENCOUNTER — Other Ambulatory Visit: Payer: Self-pay | Admitting: Urology

## 2021-11-27 DIAGNOSIS — R109 Unspecified abdominal pain: Secondary | ICD-10-CM

## 2021-11-27 LAB — CULTURE, URINE COMPREHENSIVE

## 2021-11-30 ENCOUNTER — Other Ambulatory Visit: Payer: Self-pay

## 2021-11-30 ENCOUNTER — Ambulatory Visit
Admission: RE | Admit: 2021-11-30 | Discharge: 2021-11-30 | Disposition: A | Discharge status: Home / Self Care | Payer: Self-pay | Source: Ambulatory Visit | Attending: Urology | Admitting: Urology

## 2021-11-30 DIAGNOSIS — R109 Unspecified abdominal pain: Secondary | ICD-10-CM

## 2021-12-01 ENCOUNTER — Ambulatory Visit (INDEPENDENT_AMBULATORY_CARE_PROVIDER_SITE_OTHER): Payer: 59 | Admitting: Urology

## 2021-12-01 VITALS — BP 126/85 | HR 90 | Ht 62.0 in | Wt 170.0 lb

## 2021-12-01 DIAGNOSIS — N200 Calculus of kidney: Secondary | ICD-10-CM

## 2021-12-01 DIAGNOSIS — R310 Gross hematuria: Secondary | ICD-10-CM

## 2021-12-01 DIAGNOSIS — R109 Unspecified abdominal pain: Secondary | ICD-10-CM

## 2021-12-01 NOTE — Progress Notes (Unsigned)
12/01/2021 10:58 AM   Herbert Seta Allyn Kenner [birth year] [id number]  Referring provider: Marjie Skiff, NP [address] [place],  [place] [address]  Urological history: 1. Nephrolithiasis History of spontaneous passage of stones  2. rUTI's -contributing factors of  -documented positive urine cultures over the last year  None  Chief Complaint  Patient presents with   Results    HPI: [name] is a 36 y.o. female who presents today to review CT scan results.   She continues to have bilateral flank pain associated with frequency, urgency, dysuria, episodes of gross hematuria and nausea.  CT renal stone study completed 11/30/2021 did not demonstrate any hydronephrosis, renal masses.  Punctate stones were noted bilaterally.  Largest stone 2 mm in size in the right lower pole.  Patient denies any modifying or aggravating factors.  Patient denies any fevers, chills or vomiting.    PMH: Past Medical History:  Diagnosis Date   ADHD (attention deficit hyperactivity disorder)    Allergy    Anxiety    Bipolar 2 disorder (HCC)    Blood transfusion as the cause of abnormal reaction of patient, or of later complication, without misadventure at the time of procedure    lost to much blood during delivery   Depression    Interstitial cystitis    Kidney stones    PCOS (polycystic ovarian syndrome)    Sleep apnea     Surgical History: Past Surgical History:  Procedure Laterality Date   interstitial cystitis laparoscopy     KIDNEY STONE SURGERY     basket, lithotripsy   KNEE ARTHROSCOPY Right    TONSILLECTOMY AND ADENOIDECTOMY     TUBAL LIGATION      Home Medications:  Allergies as of 12/01/2021       Reactions   Latex Anaphylaxis   Penicillins Nausea And Vomiting        Medication List        Accurate as of December 01, 2021 11:59 PM. If you have any questions, ask your nurse or doctor.          STOP taking these medications     sulfamethoxazole-trimethoprim 800-160 MG tablet Commonly known as: BACTRIM DS       TAKE these medications    amphetamine-dextroamphetamine 30 MG 24 hr capsule Commonly known as: ADDERALL XR Take 1 capsule (30 mg total) by mouth every morning.   amphetamine-dextroamphetamine 30 MG 24 hr capsule Commonly known as: ADDERALL XR Take 1 capsule (30 mg total) by mouth every morning.   amphetamine-dextroamphetamine 30 MG 24 hr capsule Commonly known as: ADDERALL XR TAKE 1 CAPSULE BY MOUTH EVERY MORNING   cetirizine 5 MG tablet Commonly known as: ZYRTEC Take 5 mg by mouth daily.   Cholecalciferol 1.25 MG (50000 UT) Tabs Take 1 tablet by mouth once a week.   esomeprazole 20 MG capsule Commonly known as: NEXIUM Take 20 mg by mouth daily at 12 noon.   ofloxacin 0.3 % ophthalmic solution Commonly known as: OCUFLOX Place 1 drop into the right eye 4 (four) times daily.   ondansetron 4 MG disintegrating tablet Commonly known as: ZOFRAN-ODT Dissolve 1 tablet (4 mg total) in the mouth every 8 (eight) hours as needed (Take 1 tablet (4 mg total) by mouth every 8 (eight) hours as needed)   spironolactone 100 MG tablet Commonly known as: Aldactone Take 1 tablet (100 mg total) by mouth 2 (two) times daily.   tamsulosin 0.4 MG Caps capsule Commonly known  as: FLOMAX Take 1 capsule (0.4 mg total) by mouth daily.   traMADol 50 MG tablet Commonly known as: ULTRAM Take 1 tablet (50 mg total) by mouth every 6 (six) hours as needed for Pain for up to 5 days (Take 1 tablet (50 mg total) by mouth every 6 (six) hours as needed for Pain for up to 5 days)   valACYclovir 1000 MG tablet Commonly known as: VALTREX Take 1 tablet (1,000 mg total) by mouth 2 (two) times daily for 10 days, then stop.  May repeat as needed for flares.   Wegovy 1.7 MG/0.75ML Soaj Generic drug: Semaglutide-Weight Management Inject 1.7 mg into the skin once a week.        Allergies:  Allergies  Allergen  Reactions   Latex Anaphylaxis   Penicillins Nausea And Vomiting    Family History: Family History  Problem Relation Age of Onset   Hyperlipidemia Mother    Hypertension Mother    Hypertension Father    Hyperlipidemia Father    Hyperlipidemia Sister    Hyperlipidemia Maternal Grandfather    Hypertension Maternal Grandfather    Hypertension Paternal Grandmother    Hyperlipidemia Paternal Grandmother    Hyperlipidemia Paternal Grandfather    Hypertension Paternal Grandfather     Social History:  reports that she has never smoked. She has never used smokeless tobacco. She reports current alcohol use. She reports that she does not use drugs.  ROS: Pertinent ROS in HPI  Physical Exam: BP 126/85   Pulse 90   Ht 5\' 2"  (1.575 m)   Wt 170 lb (77.1 kg)   BMI 31.09 kg/m   Constitutional:  Well nourished. Alert and oriented, No acute distress. HEENT: [place] AT, moist mucus membranes.  Trachea midline Cardiovascular: No clubbing, cyanosis, or edema. Respiratory: Normal respiratory effort, no increased work of breathing. Neurologic: Grossly intact, no focal deficits, moving all 4 extremities. Psychiatric: Normal mood and affect.    Laboratory Data: N/A   Pertinent Imaging: CLINICAL DATA:  Bilateral flank pain, hematuria   EXAM: CT ABDOMEN AND PELVIS WITHOUT CONTRAST   TECHNIQUE: Multidetector CT imaging of the abdomen and pelvis was performed following the standard protocol without IV contrast.   RADIATION DOSE REDUCTION: This exam was performed according to the departmental dose-optimization program which includes automated exposure control, adjustment of the mA and/or kV according to patient size and/or use of iterative reconstruction technique.   COMPARISON:  10/18/2021   FINDINGS: Lower chest: No acute abnormality.   Hepatobiliary: No focal liver abnormality is seen. Status post cholecystectomy. No biliary dilatation.   Pancreas: Unremarkable. No pancreatic ductal  dilatation or surrounding inflammatory changes.   Spleen: Normal in size without focal abnormality.   Adrenals/Urinary Tract: Unremarkable adrenal glands. Multiple punctate 1-2 mm nonobstructing stones within each kidney. No hydronephrosis. No ureteral calculi are identified. There are several pelvic phleboliths, unchanged in positioning compared to the previous exam. Urinary bladder appears unremarkable for the degree of distension.   Stomach/Bowel: Stomach is within normal limits. Appendix not visualized. No evidence of bowel wall thickening, distention, or inflammatory changes.   Vascular/Lymphatic: No significant vascular findings are present. No enlarged abdominal or pelvic lymph nodes.   Reproductive: Anteverted uterus. Tubal ligation clip is again noted posterior to the uterus. Adnexal regions within normal limits.   Other: No free fluid. No abdominopelvic fluid collection. No pneumoperitoneum. No abdominal wall hernia.   Musculoskeletal: No new or acute bony findings.   IMPRESSION: Multiple punctate nonobstructing renal calculi bilaterally. No ureteral  calculi or evidence of obstructive uropathy.     Electronically Signed   By: Duanne Guess D.O.   On: 11/30/2021 16:42 I have independently reviewed the films.  See HPI.    Assessment & Plan:    1. Bilateral flank pain -she continues to have issues with flank pain -dicussed CT findings and explained that the punctate renal stones would not be the cause of her pain and that because of their size, no definitive treatment is indicated   2. Gross hematuria -she has had a contrast CT and a non-contrast CT recently with the findings of punctate stones -explained that with her complaint of gross heme a few weeks ago and with a negative urine culture and with her CT findings of punctate stones, we will need to pursue cystoscopy for further evaluation of her lower urinary tract symptoms/gross heme  3.  Nephrolithiasis -see #1    Return for cysto w/ Dr. Apolinar Junes for gross heme .  These notes generated with voice recognition software. I apologize for typographical errors.  Cloretta Ned  [facility] [address]  [address] [place], [place] [address] [phone]

## 2021-12-02 ENCOUNTER — Encounter: Payer: Self-pay | Admitting: Urology

## 2021-12-02 DIAGNOSIS — Z79899 Other long term (current) drug therapy: Secondary | ICD-10-CM | POA: Insufficient documentation

## 2021-12-02 NOTE — Patient Instructions (Signed)

## 2021-12-05 ENCOUNTER — Ambulatory Visit (INDEPENDENT_AMBULATORY_CARE_PROVIDER_SITE_OTHER): Payer: 59 | Admitting: Nurse Practitioner

## 2021-12-05 ENCOUNTER — Encounter: Payer: Self-pay | Admitting: Nurse Practitioner

## 2021-12-05 VITALS — BP 117/82 | HR 89 | Temp 98.1°F | Ht 62.0 in | Wt 172.7 lb

## 2021-12-05 DIAGNOSIS — F902 Attention-deficit hyperactivity disorder, combined type: Secondary | ICD-10-CM | POA: Diagnosis not present

## 2021-12-05 DIAGNOSIS — F418 Other specified anxiety disorders: Secondary | ICD-10-CM | POA: Diagnosis not present

## 2021-12-05 DIAGNOSIS — E6609 Other obesity due to excess calories: Secondary | ICD-10-CM | POA: Diagnosis not present

## 2021-12-05 DIAGNOSIS — Z6837 Body mass index (BMI) 37.0-37.9, adult: Secondary | ICD-10-CM

## 2021-12-05 DIAGNOSIS — E282 Polycystic ovarian syndrome: Secondary | ICD-10-CM | POA: Diagnosis not present

## 2021-12-05 DIAGNOSIS — G4733 Obstructive sleep apnea (adult) (pediatric): Secondary | ICD-10-CM

## 2021-12-05 MED ORDER — AMPHETAMINE-DEXTROAMPHET ER 30 MG PO CP24
30.0000 mg | ORAL_CAPSULE | ORAL | 0 refills | Status: DC
Start: 2022-01-16 — End: 2022-02-19

## 2021-12-05 MED ORDER — AMPHETAMINE-DEXTROAMPHET ER 30 MG PO CP24: 30.0000 mg | ORAL_CAPSULE | ORAL | 0 refills | Status: DC

## 2021-12-05 MED ORDER — WEGOVY 2.4 MG/0.75ML ~~LOC~~ SOAJ: 2.4000 mg | SUBCUTANEOUS | 4 refills | Status: DC

## 2021-12-05 MED ORDER — VENLAFAXINE HCL ER 37.5 MG PO CP24: 37.5000 mg | ORAL_CAPSULE | Freq: Every day | ORAL | 4 refills | Status: DC

## 2021-12-05 MED ORDER — AMPHETAMINE-DEXTROAMPHET ER 30 MG PO CP24
30.0000 mg | ORAL_CAPSULE | Freq: Every morning | ORAL | 0 refills | Status: DC
Start: 2021-12-16 — End: 2022-02-19

## 2021-12-05 NOTE — Progress Notes (Signed)
BP 117/82   Pulse 89   Temp 98.1 F (36.7 C) (Oral)   Ht 5\' 2"  (1.575 m)   Wt 172 lb 11.2 oz (78.3 kg)   SpO2 (!) 85%   BMI 31.59 kg/m    Subjective:    Patient ID: , female    DOB: June 11, 1985, 36 y.o.   MRN: [id number]  HPI: [name] is a 36 y.o. female  Chief Complaint  Patient presents with   ADD   Weight Check   Anxiety    Patient says she is experiencing a lot of Anxiety lately with everyday life and stressors.    Stress   ADHD FOLLOW UP + PCOS (WEIGHT LOSS) Continues on Adderall XR 30 MG daily, which offers her benefit -- mood and focus at work and school.  PDMP last fill was 11/16/21     Currently taking Wegovy 1.7 MG -- started February 2023 (with Ozempic).  Has lost 28 pounds since starting medication -- in December was 242 at home, however this check due to stressors did gain a small amount. Working on exercise and diet changes as well.  Has PCOS.  Did have to stop this for a couple weeks due to cholecystectomy on 10/11/21. ADHD status: controlled Satisfied with current therapy: yes Medication compliance:  good compliance Controlled substance contract: yes Previous psychiatry evaluation: no Previous medications: yes adderall and adderall XR   Taking meds on weekends/vacations: yes Work/school performance:  good Difficulty sustaining attention/completing tasks: no Distracted by extraneous stimuli: no Does not listen when spoken to: no  Fidgets with hands or feet: no Unable to stay in seat: no Blurts out/interrupts others: no ADHD Medication Side Effects: no    Decreased appetite: no    Headache: no    Sleeping disturbance pattern: no    Irritability: no    Rebound effects (worse than baseline) off medication: no    Anxiousness: no    Dizziness: no    Tics: no   ANXIETY/STRESS No current medications, having increased stressors at home due to life situations and 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  Does not have time for therapy at  this time due to work -- recent surgery and current kidney stones.  In past has taken Zoloft, Prozac, Xanax, Buspar, Lexapro (made more irritable). Duration:uncontrolled Anxious mood: yes  Excessive worrying: yes Irritability: yes Sweating: no Nausea: no Palpitations:no Hyperventilation: no Panic attacks: yes Agoraphobia: no  Obscessions/compulsions: no Depressed mood: yes    12/05/2021    8:33 AM 08/15/2021   11:21 AM 05/18/2021   11:03 AM  Depression screen PHQ 2/9  Decreased Interest 2 1 0  Down, Depressed, Hopeless 2 1 0  PHQ - 2 Score 4 2 0  Altered sleeping 2 2 2   Tired, decreased energy 2 1 1   Change in appetite 2 1 1   Feeling bad or failure about yourself  3 0 1  Trouble concentrating 2 1 1   Moving slowly or fidgety/restless 1 0 0  Suicidal thoughts 0 0 0  PHQ-9 Score 16 7 6   Difficult doing work/chores Somewhat difficult    Anhedonia: no Weight changes: no Insomnia: yes hard to fall asleep  Hypersomnia: no Fatigue/loss of energy: yes Feelings of worthlessness: no Feelings of guilt: yes Impaired concentration/indecisiveness: no Suicidal ideations: no  Crying spells: yes Recent Stressors/Life Changes: yes   Relationship problems: no   Family stress: yes     Financial stress: yes    Job stress: no  Recent death/loss: no     12/05/2021    8:34 AM 08/15/2021   11:22 AM 05/18/2021   11:03 AM  GAD 7 : Generalized Anxiety Score  Nervous, Anxious, on Edge 3 1 0  Control/stop worrying 2 1 1   Worry too much - different things 2 2 2   Trouble relaxing 3 2 1   Restless 1 0 0  Easily annoyed or irritable 3 2 0  Afraid - awful might happen 1 1 0  Total GAD 7 Score 15 9 4   Anxiety Difficulty Somewhat difficult Somewhat difficult      Relevant past medical, surgical, family and social history reviewed and updated as indicated. Interim medical history since our last visit reviewed. Allergies and medications reviewed and updated.  Review of Systems  Constitutional:   Negative for activity change, appetite change, diaphoresis, fatigue and fever.  Respiratory:  Negative for cough, chest tightness and shortness of breath.   Cardiovascular:  Negative for chest pain, palpitations and leg swelling.  Gastrointestinal: Negative.   Neurological: Negative.   Psychiatric/Behavioral:  Positive for decreased concentration and sleep disturbance. Negative for self-injury and suicidal ideas. The patient is nervous/anxious.     Per HPI unless specifically indicated above     Objective:    BP 117/82   Pulse 89   Temp 98.1 F (36.7 C) (Oral)   Ht 5\' 2"  (1.575 m)   Wt 172 lb 11.2 oz (78.3 kg)   SpO2 (!) 85%   BMI 31.59 kg/m   Wt Readings from Last 3 Encounters:  12/05/21 172 lb 11.2 oz (78.3 kg)  12/01/21 170 lb (77.1 kg)  11/23/21 170 lb (77.1 kg)    Physical Exam Vitals and nursing note reviewed.  Constitutional:      General: She is awake. She is not in acute distress.    Appearance: She is well-developed and well-groomed. She is morbidly obese. She is not ill-appearing or toxic-appearing.  HENT:     Head: Normocephalic.     Right Ear: Hearing normal.     Left Ear: Hearing normal.     Nose: Nose normal.     Mouth/Throat:     Mouth: Mucous membranes are moist.  Eyes:     General: Lids are normal.        Right eye: No discharge.        Left eye: No discharge.     Conjunctiva/sclera: Conjunctivae normal.     Pupils: Pupils are equal, round, and reactive to light.  Neck:     Thyroid: No thyromegaly.     Vascular: No carotid bruit or JVD.  Cardiovascular:     Rate and Rhythm: Normal rate and regular rhythm.     Heart sounds: Normal heart sounds. No murmur heard.    No gallop.  Pulmonary:     Effort: Pulmonary effort is normal.     Breath sounds: Normal breath sounds.  Abdominal:     General: Bowel sounds are normal.     Palpations: Abdomen is soft.  Musculoskeletal:     Cervical back: Normal range of motion and neck supple.     Right lower  leg: No edema.     Left lower leg: No edema.  Lymphadenopathy:     Cervical: No cervical adenopathy.  Skin:    General: Skin is warm and dry.  Neurological:     Mental Status: She is alert and oriented to person, place, and time.  Psychiatric:        Attention  and Perception: Attention normal.        Mood and Affect: Mood normal.        Behavior: Behavior normal. Behavior is cooperative.        Thought Content: Thought content normal.        Judgment: Judgment normal.    Results for orders placed or performed in visit on 11/23/21  CULTURE, URINE COMPREHENSIVE   Specimen: Urine   UR  Result Value Ref Range   Urine Culture, Comprehensive Final report    Organism ID, Bacteria Comment   Microscopic Examination   Urine  Result Value Ref Range   WBC, UA 0-5 0 - 5 /hpf   RBC, Urine 0-2 0 - 2 /hpf   Epithelial Cells (non renal) 0-10 0 - 10 /hpf   Bacteria, UA Many (A) None seen/Few  Urinalysis, Complete  Result Value Ref Range   Specific Gravity, UA 1.010 1.005 - 1.030   pH, UA 6.5 5.0 - 7.5   Color, UA Yellow Yellow   Appearance Ur Clear Clear   Leukocytes,UA Negative Negative   Protein,UA Negative Negative/Trace   Glucose, UA Negative Negative   Ketones, UA Negative Negative   RBC, UA Negative Negative   Bilirubin, UA Negative Negative   Urobilinogen, Ur 0.2 0.2 - 1.0 mg/dL   Nitrite, UA Negative Negative   Microscopic Examination See below:       Assessment & Plan:   Problem List Items Addressed This Visit       Respiratory   Obstructive sleep apnea of adult   Relevant Orders   CBC with Differential/Platelet     Endocrine   PCOS (polycystic ovarian syndrome)   Relevant Orders   CBC with Differential/Platelet   Comprehensive metabolic panel     Other   ADHD (attention deficit hyperactivity disorder)   Relevant Orders   Comprehensive metabolic panel   Obesity - Primary   Relevant Medications   Semaglutide-Weight Management (WEGOVY) 2.4 MG/0.75ML SOAJ    Other Relevant Orders   CBC with Differential/Platelet   Comprehensive metabolic panel   Situational anxiety   Relevant Medications   venlafaxine XR (EFFEXOR XR) 37.5 MG 24 hr capsule     Follow up plan: Return in about 3 months (around 03/07/2022) for ADD & MOOD + WEIGHT LOSS.

## 2021-12-05 NOTE — Assessment & Plan Note (Signed)
Current with multiple triggers at home.  Discussed treatment options at length with her.  Will start Effexor 37.5 MG daily and adjust as needed if tolerated.  Educated her on this medication and side effects to report.  She will reach out via MyChart with any concerns in upcoming weeks.  Denies SI/HI.

## 2021-12-05 NOTE — Assessment & Plan Note (Signed)
Chronic, ongoing, taking Spironolactone and Wegovy -- tolerating well with weight loss present.  Labs tup to date.

## 2021-12-05 NOTE — Assessment & Plan Note (Signed)
BMI 31.59, has lost 27 pounds thus far and is taking Wegovy 1.7 MG.  Increase to 2.4 MG dosing weekly.  This will offer benefit to PCOS and overall health.  Recommended eating smaller high protein, low fat meals more frequently and exercising 30 mins a day 5 times a week with a goal of 10-15lb weight loss in the next 3 months. Patient voiced their understanding and motivation to adhere to these recommendations.

## 2021-12-05 NOTE — Assessment & Plan Note (Addendum)
Ongoing, has taken medication multiple times in past, starting in childhood.  Will continue Adderall XR 30 MG daily, to assist her with school and working full-time.  In past did not tolerate Ritalin or Concerta per her report, will avoid these.  UDS up to date and contract -- next due 02/17/22.  She is aware of need for every 3 month visits and yearly UDS, agrees with this plan.  Predated refills sent for the next 3 months.

## 2021-12-06 ENCOUNTER — Encounter: Payer: Self-pay | Admitting: Nurse Practitioner

## 2021-12-07 MED ORDER — SPIRONOLACTONE 100 MG PO TABS
100.0000 mg | ORAL_TABLET | Freq: Two times a day (BID) | ORAL | 4 refills | Status: DC
Start: 2021-12-07 — End: 2021-12-07

## 2021-12-07 MED ORDER — SPIRONOLACTONE 100 MG PO TABS: 100.0000 mg | ORAL_TABLET | Freq: Two times a day (BID) | ORAL | 4 refills | Status: DC

## 2021-12-11 ENCOUNTER — Ambulatory Visit
Admission: EM | Admit: 2021-12-11 | Discharge: 2021-12-11 | Disposition: A | Discharge status: Home / Self Care | Payer: Managed Care, Other (non HMO) | Attending: Emergency Medicine | Admitting: Emergency Medicine

## 2021-12-11 DIAGNOSIS — L03213 Periorbital cellulitis: Secondary | ICD-10-CM

## 2021-12-11 MED ORDER — CEFDINIR 300 MG PO CAPS: 300.0000 mg | ORAL_CAPSULE | Freq: Two times a day (BID) | ORAL | 0 refills | Status: AC

## 2021-12-11 NOTE — Discharge Instructions (Addendum)
Take the cefdinir as directed.   Follow up with your primary care provider tomorrow.   Go to the emergency department if you have eye pain, changes in your vision, or other concerning symptoms.

## 2021-12-11 NOTE — ED Triage Notes (Signed)
Patient to Urgent Care with complaints of left eye pain, irritation, and swelling.   Reports that on Friday she felt like sand or another foreign object was in her eye, reports rubbing her eye. On Saturday, reports that she felt sore under her eye, and had some pain and swelling under her eye. Yesterday difficulty opening eye. This am- had to apply a warm compression onto her eye in order to open it.   No actual eye pain- pain under eye and in inner corner eye. Reports pain radiates down into the cheek

## 2021-12-11 NOTE — ED Provider Notes (Signed)
[facility]    CSN: [id number] Arrival date & time: 12/11/21  1354      History   Chief Complaint Chief Complaint  Patient presents with   Eye Problem    Swollen eye, pain, burning around the eye - Entered by patient    HPI [name] is a 36 y.o. female.  Patient presents with left eye redness, swelling, pain, and drainage x3 days.  No eye injury.  Her vision is occasionally slightly blurry which she attributes to squinting.  She woke up today with increased redness and swelling.  She had to use a warm washcloth to clean the crusting from her eyelashes.  She denies fever, chills, sore throat, cough, shortness of breath, or other symptoms.  She had leftover ofloxacin eyedrops which she used last night.  The history is provided by the patient and medical records.    Past Medical History:  Diagnosis Date   ADHD (attention deficit hyperactivity disorder)    Allergy    Anxiety    Blood transfusion as the cause of abnormal reaction of patient, or of later complication, without misadventure at the time of procedure    lost to much blood during delivery   Depression    Interstitial cystitis    Kidney stones    PCOS (polycystic ovarian syndrome)    Sleep apnea     Patient Active Problem List   Diagnosis Date Noted   Situational anxiety 12/05/2021   Controlled substance agreement signed 12/02/2021   Vitamin D deficiency 05/14/2021   ADHD (attention deficit hyperactivity disorder) 02/17/2021   Obstructive sleep apnea of adult 11/02/2020   Elevated TSH 08/30/2020   Obesity 07/01/2020   PCOS (polycystic ovarian syndrome) 01/07/2015   Hirsutism 12/15/2014   Cervical disc disorder with radiculopathy of cervical region 12/15/2014   HERPES LABIALIS 12/02/2006   CYSTITIS, CHRONIC INTERSTITIAL 11/21/2006    Past Surgical History:  Procedure Laterality Date   CHOLECYSTECTOMY  10/12/2021   interstitial cystitis laparoscopy     KIDNEY STONE SURGERY     basket,  lithotripsy   KNEE ARTHROSCOPY Right    TONSILLECTOMY AND ADENOIDECTOMY     TUBAL LIGATION      OB History   No obstetric history on file.      Home Medications    Prior to Admission medications   Medication Sig Start Date End Date Taking? Authorizing Provider  cefdinir (OMNICEF) 300 MG capsule Take 1 capsule (300 mg total) by mouth 2 (two) times daily for 7 days. 12/11/21 12/18/21 Yes Mickie Bail, NP  amphetamine-dextroamphetamine (ADDERALL XR) 30 MG 24 hr capsule Take 1 capsule (30 mg total) by mouth every morning. 12/16/21   Cannady, [name] T, NP  amphetamine-dextroamphetamine (ADDERALL XR) 30 MG 24 hr capsule Take 1 capsule (30 mg total) by mouth every morning. 01/16/22   Cannady, [name] T, NP  amphetamine-dextroamphetamine (ADDERALL XR) 30 MG 24 hr capsule Take 1 capsule (30 mg total) by mouth every morning. 02/16/22   Cannady, [name] T, NP  cetirizine (ZYRTEC) 5 MG tablet Take 5 mg by mouth daily.    [provider]  Cholecalciferol 1.25 MG (50000 UT) TABS Take 1 tablet by mouth once a week. 09/13/20   Cannady, [name] T, NP  esomeprazole (NEXIUM) 20 MG capsule Take 20 mg by mouth daily at 12 noon.    [provider]  ondansetron (ZOFRAN-ODT) 4 MG disintegrating tablet Take 1 tablet (4 mg total) by mouth every 8 (eight) hours as needed  10/12/21     Semaglutide-Weight Management (WEGOVY) 2.4 MG/0.75ML SOAJ Inject 2.4 mg into the skin once a week. 12/05/21   Cannady, [name] T, NP  spironolactone (ALDACTONE) 100 MG tablet Take 1 tablet (100 mg total) by mouth 2 (two) times daily. 12/07/21   Cannady, [name] T, NP  tamsulosin (FLOMAX) 0.4 MG CAPS capsule Take 1 capsule (0.4 mg total) by mouth daily. 11/23/21   McGowan, Wellington Hampshire, PA-C  traMADol (ULTRAM) 50 MG tablet Take 1 tablet (50 mg total) by mouth every 6 (six) hours as needed for Pain for up to 5 days 10/12/21     valACYclovir (VALTREX) 1000 MG tablet Take 1 tablet (1,000 mg total) by mouth 2 (two) times daily for 10 days,  then stop.  May repeat as needed for flares. 04/20/21   Aura Dials T, NP  venlafaxine XR (EFFEXOR XR) 37.5 MG 24 hr capsule Take 1 capsule (37.5 mg total) by mouth daily with breakfast. 12/05/21   Aura Dials T, NP    Family History Family History  Problem Relation Age of Onset   Hyperlipidemia Mother    Hypertension Mother    Hypertension Father    Hyperlipidemia Father    Hyperlipidemia Sister    Hyperlipidemia Maternal Grandfather    Hypertension Maternal Grandfather    Hypertension Paternal Grandmother    Hyperlipidemia Paternal Grandmother    Hyperlipidemia Paternal Grandfather    Hypertension Paternal Grandfather     Social History Social History   Tobacco Use   Smoking status: Never   Smokeless tobacco: Never  Vaping Use   Vaping Use: Never used  Substance Use Topics   Alcohol use: Yes    Comment: on occasion   Drug use: No     Allergies   Latex and Penicillins   Review of Systems Review of Systems  Constitutional:  Negative for chills and fever.  HENT:  Negative for ear pain and sore throat.   Eyes:  Positive for pain, discharge, redness and visual disturbance.  Respiratory:  Negative for cough and shortness of breath.   All other systems reviewed and are negative.    Physical Exam Triage Vital Signs ED Triage Vitals  Enc Vitals Group     BP 12/11/21 1408 118/81     Pulse Rate 12/11/21 1408 83     Resp 12/11/21 1408 18     Temp 12/11/21 1408 97.7 F (36.5 C)     Temp src --      SpO2 12/11/21 1408 97 %     Weight 12/11/21 1403 170 lb (77.1 kg)     Height 12/11/21 1403 5\' 2"  (1.575 m)     Head Circumference --      Peak Flow --      Pain Score 12/11/21 1402 5     Pain Loc --      Pain Edu? --      Excl. in GC? --    No data found.  Updated Vital Signs BP 118/81   Pulse 83   Temp 97.7 F (36.5 C)   Resp 18   Ht 5\' 2"  (1.575 m)   Wt 170 lb (77.1 kg)   LMP 12/03/2021   SpO2 97%   BMI 31.09 kg/m   Visual Acuity Right Eye  Distance: 20/50 Left Eye Distance: 20/50 Bilateral Distance: 20/30  Right Eye Near:   Left Eye Near:    Bilateral Near:     Physical Exam Vitals and nursing note reviewed.  Constitutional:  General: She is not in acute distress.    Appearance: She is well-developed.  HENT:     Right Ear: Tympanic membrane normal.     Left Ear: Tympanic membrane normal.     Nose: Nose normal.     Mouth/Throat:     Mouth: Mucous membranes are moist.     Pharynx: Oropharynx is clear.  Eyes:     General: Vision grossly intact.        Right eye: No discharge.        Left eye: No discharge.     Extraocular Movements: Extraocular movements intact.     Conjunctiva/sclera:     Right eye: Right conjunctiva is not injected.     Left eye: Left conjunctiva is injected.     Pupils: Pupils are equal, round, and reactive to light.     Comments: Left periorbital mild erythema and edema. Conjunctiva injected.  Tearing but no purulent drainage.    Cardiovascular:     Rate and Rhythm: Normal rate and regular rhythm.     Heart sounds: No murmur heard. Pulmonary:     Effort: Pulmonary effort is normal. No respiratory distress.     Breath sounds: Normal breath sounds.  Abdominal:     Palpations: Abdomen is soft.     Tenderness: There is no abdominal tenderness.  Musculoskeletal:        General: No swelling.     Cervical back: Neck supple.  Skin:    General: Skin is warm and dry.     Capillary Refill: Capillary refill takes less than 2 seconds.  Neurological:     Mental Status: She is alert.  Psychiatric:        Mood and Affect: Mood normal.      UC Treatments / Results  Labs (all labs ordered are listed, but only abnormal results are displayed) Labs Reviewed - No data to display  EKG   Radiology No results found.  Procedures Procedures (including critical care time)  Medications Ordered in UC Medications - No data to display  Initial Impression / Assessment and Plan / UC Course  I  have reviewed the triage vital signs and the nursing notes.  Pertinent labs & imaging results that were available during my care of the patient were reviewed by me and considered in my medical decision making (see chart for details).   Preseptal cellulitis of left eye.  No eye trauma.  Treating with cefdinir (patient has GI intolerance to penicillin).  Education provided on preseptal cellulitis.  Instructed patient to follow-up with her PCP tomorrow.  ED precautions discussed.  Patient agrees to plan of care.   Final Clinical Impressions(s) / UC Diagnoses   Final diagnoses:  Preseptal cellulitis of left eye     Discharge Instructions      Take the cefdinir as directed.   Follow up with your primary care provider tomorrow.   Go to the emergency department if you have eye pain, changes in your vision, or other concerning symptoms.         ED Prescriptions     Medication Sig Dispense Auth. Provider   cefdinir (OMNICEF) 300 MG capsule Take 1 capsule (300 mg total) by mouth 2 (two) times daily for 7 days. 14 capsule Mickie Bail, NP      PDMP not reviewed this encounter.   Mickie Bail, NP 12/11/21 1438

## 2021-12-12 ENCOUNTER — Encounter: Payer: Self-pay | Admitting: Nurse Practitioner

## 2021-12-12 ENCOUNTER — Ambulatory Visit: Payer: Self-pay

## 2021-12-12 ENCOUNTER — Ambulatory Visit (INDEPENDENT_AMBULATORY_CARE_PROVIDER_SITE_OTHER): Payer: 59 | Admitting: Nurse Practitioner

## 2021-12-12 ENCOUNTER — Other Ambulatory Visit: Payer: 59 | Admitting: Urology

## 2021-12-12 DIAGNOSIS — H00015 Hordeolum externum left lower eyelid: Secondary | ICD-10-CM

## 2021-12-12 DIAGNOSIS — H00019 Hordeolum externum unspecified eye, unspecified eyelid: Secondary | ICD-10-CM | POA: Insufficient documentation

## 2021-12-12 MED ORDER — ERYTHROMYCIN 5 MG/GM OP OINT: 1.0000 | TOPICAL_OINTMENT | Freq: Every day | OPHTHALMIC | 0 refills | Status: AC

## 2021-12-12 NOTE — Patient Instructions (Signed)
Stye ?A stye, also known as a hordeolum, is a bump that forms on an eyelid. It may look like a pimple next to the eyelash. A stye can form inside the eyelid (internal stye) or outside the eyelid (external stye). A stye can cause redness, swelling, and pain on the eyelid. ?Styes are very common. Anyone can get them at any age. They usually occur in just one eye at a time, but you may have more than one in either eye. ?What are the causes? ?A stye is caused by an infection. The infection is almost always caused by bacteria called Staphylococcus aureus. This is a common type of bacteria that lives on the skin. ?An internal stye may result from an infected oil-producing gland inside the eyelid. An external stye may be caused by an infection at the base of the eyelash (hair follicle). ?What increases the risk? ?You are more likely to develop a stye if: ?You have had a stye before. ?You have any of these conditions: ?Red, itchy, inflamed eyelids (blepharitis). ?A skin condition such as seborrheic dermatitis or rosacea. ?High fat levels in your blood (lipids). ?Dry eyes. ?What are the signs or symptoms? ?The most common symptom of a stye is eyelid pain. Internal styes are more painful than external styes. Other symptoms may include: ?Painful swelling of your eyelid. ?A scratchy feeling in your eye. ?Tearing and redness of your eye. ?A pimple-like bump on the edge of the eyelid. ?Pus draining from the stye. ?How is this diagnosed? ?Your health care provider may be able to diagnose a stye just by examining your eye. The health care provider may also check to make sure: ?You do not have a fever or other signs of a more serious infection. ?The infection has not spread to other parts of your eye or areas around your eye. ?How is this treated? ?Most styes will clear up in a few days without treatment or with warm compresses applied to the area. You may need to use antibiotic drops or ointment to treat an infection. Sometimes,  steroid drops or ointment are used in addition to antibiotics. ?In some cases, your health care provider may give you a small steroid injection in the eyelid. ?If your stye does not heal with routine treatment, your health care provider may drain pus from the stye using a thin blade or needle. This may be done if the stye is large, causing a lot of pain, or affecting your vision. ?Follow these instructions at home: ?Take over-the-counter and prescription medicines only as told by your health care provider. This includes eye drops or ointments. ?If you were prescribed an antibiotic medicine, steroid medicine, or both, apply or use them as told by your health care provider. Do not stop using the medicine even if your condition improves. ?Apply a warm, wet cloth (warm compress) to your eye for 5-10 minutes, 4 to 6 times a day. ?Clean the affected eyelid as directed by your health care provider. ?Do not wear contact lenses or eye makeup until your stye has healed and your health care provider says that it is safe. ?Do not try to pop or drain the stye. ?Do not rub your eye. ?Contact a health care provider if: ?You have chills or a fever. ?Your stye does not go away after several days. ?Your stye affects your vision. ?Your eyeball becomes swollen, red, or painful. ?Get help right away if: ?You have pain when moving your eye around. ?Summary ?A stye is a bump that forms   on an eyelid. It may look like a pimple next to the eyelash. ?A stye can form inside the eyelid (internal stye) or outside the eyelid (external stye). A stye can cause redness, swelling, and pain on the eyelid. ?Your health care provider may be able to diagnose a stye just by examining your eye. ?Apply a warm, wet cloth (warm compress) to your eye for 5-10 minutes, 4 to 6 times a day. ?This information is not intended to replace advice given to you by your health care provider. Make sure you discuss any questions you have with your health care  provider. ?Document Revised: 06/01/2020 Document Reviewed: 06/01/2020 ?Elsevier Patient Education ? 2023 [employer] ? ?

## 2021-12-12 NOTE — Progress Notes (Signed)
BP 113/75   Pulse 79   Temp 97.7 F (36.5 C) (Oral)   Ht 5\' 2"  (1.575 m)   Wt 169 lb 8 oz (76.9 kg)   LMP 12/03/2021   SpO2 95%   BMI 31.00 kg/m    Subjective:    Patient ID: 12/05/2021, female    DOB: 1985/05/17, 36 y.o.   MRN: [id number]  HPI: [name] is a 36 y.o. female  Chief Complaint  Patient presents with   Eye Problem    Patient is here for follow up from Urgent Care visit due to Eye Pain.    EYE PAIN Diagnosed with preseptal cellulitis at urgent care on 12/11/21.  Started on Cefdinir.  She is unsure how this happened, Friday it felt like it had sand in it and then Sunday it was closed and swollen.  She reports size is decreasing.   Duration:  days Involved eye:  left Onset: sudden Severity: mild  Quality: dull and aching Foreign body sensation: occasional Visual impairment:  a little blurry, but improving Eye redness: yes Discharge: yes Crusting or matting of eyelids: yes Swelling: yes Photophobia: yes Itching: no Tearing: yes Headache: no Floaters: no URI symptoms: no Contact lens use: no Close contacts with similar problems: no Eye trauma: no Aggravating factors: unknown Alleviating factors: abx treatment Status: stable Treatments attempted: as above  Relevant past medical, surgical, family and social history reviewed and updated as indicated. Interim medical history since our last visit reviewed. Allergies and medications reviewed and updated.  Review of Systems  Constitutional:  Negative for activity change, appetite change, diaphoresis, fatigue and fever.  Eyes:  Positive for photophobia, discharge, redness and visual disturbance. Negative for itching.  Respiratory:  Negative for cough, chest tightness and shortness of breath.   Cardiovascular:  Negative for chest pain, palpitations and leg swelling.  Gastrointestinal: Negative.   Neurological: Negative.   Psychiatric/Behavioral: Negative.      Per HPI unless  specifically indicated above     Objective:    BP 113/75   Pulse 79   Temp 97.7 F (36.5 C) (Oral)   Ht 5\' 2"  (1.575 m)   Wt 169 lb 8 oz (76.9 kg)   LMP 12/03/2021   SpO2 95%   BMI 31.00 kg/m   Wt Readings from Last 3 Encounters:  12/12/21 169 lb 8 oz (76.9 kg)  12/11/21 170 lb (77.1 kg)  12/05/21 172 lb 11.2 oz (78.3 kg)    Physical Exam Vitals and nursing note reviewed.  Constitutional:      General: She is awake. She is not in acute distress.    Appearance: She is well-developed and well-groomed. She is morbidly obese. She is not ill-appearing or toxic-appearing.  HENT:     Head: Normocephalic.     Right Ear: Hearing normal.     Left Ear: Hearing normal.     Nose: Nose normal.     Mouth/Throat:     Mouth: Mucous membranes are moist.  Eyes:     General: Lids are normal. Lids are everted, no foreign bodies appreciated. No scleral icterus.       Right eye: No foreign body, discharge or hordeolum.        Left eye: Hordeolum (to outer corner, lower lid) present.No foreign body or discharge.     Conjunctiva/sclera:     Right eye: Right conjunctiva is not injected. No exudate.    Left eye: Left conjunctiva is injected. No exudate.  Pupils: Pupils are equal, round, and reactive to light.     Visual Fields: Right eye visual fields normal and left eye visual fields normal.  Neck:     Thyroid: No thyromegaly.     Vascular: No carotid bruit or JVD.  Cardiovascular:     Rate and Rhythm: Normal rate and regular rhythm.     Heart sounds: Normal heart sounds. No murmur heard.    No gallop.  Pulmonary:     Effort: Pulmonary effort is normal.     Breath sounds: Normal breath sounds.  Abdominal:     General: Bowel sounds are normal.     Palpations: Abdomen is soft.  Musculoskeletal:     Cervical back: Normal range of motion and neck supple.     Right lower leg: No edema.     Left lower leg: No edema.  Lymphadenopathy:     Cervical: No cervical adenopathy.  Skin:     General: Skin is warm and dry.  Neurological:     Mental Status: She is alert and oriented to person, place, and time.  Psychiatric:        Attention and Perception: Attention normal.        Mood and Affect: Mood normal.        Behavior: Behavior normal. Behavior is cooperative.        Thought Content: Thought content normal.        Judgment: Judgment normal.     Results for orders placed or performed in visit on 11/23/21  CULTURE, URINE COMPREHENSIVE   Specimen: Urine   UR  Result Value Ref Range   Urine Culture, Comprehensive Final report    Organism ID, Bacteria Comment   Microscopic Examination   Urine  Result Value Ref Range   WBC, UA 0-5 0 - 5 /hpf   RBC, Urine 0-2 0 - 2 /hpf   Epithelial Cells (non renal) 0-10 0 - 10 /hpf   Bacteria, UA Many (A) None seen/Few  Urinalysis, Complete  Result Value Ref Range   Specific Gravity, UA 1.010 1.005 - 1.030   pH, UA 6.5 5.0 - 7.5   Color, UA Yellow Yellow   Appearance Ur Clear Clear   Leukocytes,UA Negative Negative   Protein,UA Negative Negative/Trace   Glucose, UA Negative Negative   Ketones, UA Negative Negative   RBC, UA Negative Negative   Bilirubin, UA Negative Negative   Urobilinogen, Ur 0.2 0.2 - 1.0 mg/dL   Nitrite, UA Negative Negative   Microscopic Examination See below:       Assessment & Plan:   Problem List Items Addressed This Visit       Other   Hordeolum eyelid    Acute with inflammation to external eye, mild.  Will continue Cefdinir at this time due to external infection, small scrape noted (?from scratching at eye), but start Erythromycin ointment for direct eye benefit.  Educated her on treatment.  Recommend she place warm compresses 4 times a day and avoid contacts or make-up until healed.  If any worsening symptoms immediately return to office.  Schedule for eye exam once improved.        Follow up plan: Return if symptoms worsen or fail to improve.

## 2021-12-12 NOTE — Telephone Encounter (Signed)
PT scheduled

## 2021-12-12 NOTE — Telephone Encounter (Signed)
  Chief Complaint: left eye swelling mild pain Symptoms: purulent drainage, redness, swelling Frequency: 12/11/21 Pertinent Negatives: Patient denies fever Disposition: [] ED /[] Urgent Care (no appt availability in office) / [x] Appointment(In office/virtual)/ []  [facility] [place] [facility]/ [] Home Care/ [] Refused Recommended Disposition /[] [place] [facility]/ []  Follow-up with PCP Additional Notes: pt provided appt today- per provider request Reason for Disposition  [1] Taking antibiotic < 72 hours (3 days) AND [2] cellulitis symptoms are SAME (not getting better) AND [3] no fever  Answer Assessment - Initial Assessment Questions 1. SYMPTOM: "What's the main symptom you're concerned about?" (e.g., redness, swelling, pain, fever, weakness)     Redness, swelling, drainage, pain 2. CELLULITIS LOCATION: "Where is the cellulitis located?" (e.g., hand, arm, foot, leg, face)     Left eye 3. CELLULITIS SIZE: "What is the size of the red area?" (e.g., inches, centimeters; compare to size of a coin) .     quarter 4. BETTER-SAME-WORSE: "Are you getting better, staying the same, or getting worse compared to the day you started the antibiotics?"      same 5. PAIN: Do you have any pain?"  If Yes, ask: "How bad is the pain?"  (e.g., Scale 1-10; mild, moderate, or severe)    - MILD (1-3): Doesn't interfere with normal activities.     - MODERATE (4-7): Interferes with normal activities or awakens from sleep.    - SEVERE (8-10): Excruciating pain, unable to do any normal activities.       mild 6. FEVER: "Do you have a fever?" If Yes, ask: "What is it, how was it measured and when did it start?"     no 7. OTHER SYMPTOMS: "Do you have any other symptoms?" (e.g., pus coming from a wound, red streaks, weakness)     Drainage-purulent brownish green  8. DIAGNOSIS DATE: "When was the cellulitis diagnosed?" "By whom?"      yesterday 9. ANTIBIOTIC NAME: "What antibiotic(s) are you taking?"  "How many times per day?"  (Be sure the patient is receiving the antibiotic as directed).      cedfinir 10. ANTIBIOTIC DATE: "When was the antibiotic started?"       Yesterday  11. FOLLOW-UP APPOINTMENT: "Do you have follow-up appointment with your doctor?"       Yes  Protocols used: Cellulitis on Antibiotic Follow-up Call-A-[facility]

## 2021-12-12 NOTE — Telephone Encounter (Signed)
Pt seen in ED for "preseptal cellulitis of left eye." ED advised to f/u with PCP. Called pt and left message to call back.

## 2021-12-12 NOTE — Assessment & Plan Note (Signed)
Acute with inflammation to external eye, mild.  Will continue Cefdinir at this time due to external infection, small scrape noted (?from scratching at eye), but start Erythromycin ointment for direct eye benefit.  Educated her on treatment.  Recommend she place warm compresses 4 times a day and avoid contacts or make-up until healed.  If any worsening symptoms immediately return to office.  Schedule for eye exam once improved.

## 2021-12-15 ENCOUNTER — Telehealth: Payer: Self-pay

## 2021-12-15 NOTE — Telephone Encounter (Signed)
Received a message from the patient in regards to her [facility] prescription being rejected. Attempted to initiate the prior authorization via CoverMyMeds and received an error message saying "the medication or product was previously approved on [id number] from 2021-08-15 to 2022-03-17." Called patient pharmacy to make them aware of message. Pharmacist re-ran the prescription and it went through. Patient was made aware and verbalized understanding.

## 2021-12-18 ENCOUNTER — Ambulatory Visit: Payer: Managed Care, Other (non HMO) | Admitting: Urology

## 2021-12-18 ENCOUNTER — Encounter: Payer: Self-pay | Admitting: Urology

## 2021-12-18 VITALS — BP 119/84 | HR 102 | Ht 62.0 in | Wt 169.0 lb

## 2021-12-18 DIAGNOSIS — N3010 Interstitial cystitis (chronic) without hematuria: Secondary | ICD-10-CM | POA: Diagnosis not present

## 2021-12-18 LAB — URINALYSIS, COMPLETE
Bilirubin, UA: NEGATIVE
Glucose, UA: NEGATIVE
Nitrite, UA: NEGATIVE
Specific Gravity, UA: 1.03 (ref 1.005–1.030)
Urobilinogen, Ur: 0.2 mg/dL (ref 0.2–1.0)
pH, UA: 5.5 (ref 5.0–7.5)

## 2021-12-18 LAB — MICROSCOPIC EXAMINATION: Epithelial Cells (non renal): >10 /hpf — AB (ref 0–10)

## 2021-12-18 MED ORDER — PHENAZOPYRIDINE HCL 100 MG PO TABS: 100.0000 mg | ORAL_TABLET | Freq: Two times a day (BID) | ORAL | 6 refills | Status: DC | PRN

## 2021-12-18 NOTE — Addendum Note (Signed)
Addended by: Sueanne Margarita on: 12/18/2021 11:35 AM   Modules accepted: Orders

## 2021-12-18 NOTE — Progress Notes (Signed)
12/18/2021 8:25 AM   [name] October 16, 1985 [id number]  Referring provider: Marjie Skiff, NP [address] [place],  [place] [address]  No chief complaint on file.   HPI: Patient recently seen with nonobstructing stones.  She had an episode of gross hematuria and was to see Dr. Apolinar Junes for cystoscopy.  She has had positive cultures and she has passed stones.  She has had bilateral back pain for several months.  Urine culture November 23, 2021 normal  Patient made appointment to see me today for interstitial cystitis.  She has had it for approximately 20 years ago.  She had a hydrodistention I believe in 2010 that was positive and brought records.  [facility] Earlene Plater.  Elmiron did not help and it caused her hair to follow-up.  1 bladder instillation cocktail may have been painful.  She could not remember any other treatments  She gets flareups once every 3 to 4 months and she could not say how long they last.  She gets more suprapubic spasms in her bladder and burning and frequency and fatigue.  She normally voids every 2 hours gets up to 3 times a night and is continent.  She has not had a hysterectomy.  Patient had mild hypermobility the bladder neck and no significant prolapse.  No levator tenderness or trigger points       PMH: Past Medical History:  Diagnosis Date   ADHD (attention deficit hyperactivity disorder)    Allergy    Anxiety    Blood transfusion as the cause of abnormal reaction of patient, or of later complication, without misadventure at the time of procedure    lost to much blood during delivery   Depression    Interstitial cystitis    Kidney stones    PCOS (polycystic ovarian syndrome)    Sleep apnea     Surgical History: Past Surgical History:  Procedure Laterality Date   CHOLECYSTECTOMY  10/12/2021   interstitial cystitis laparoscopy     KIDNEY STONE SURGERY     basket, lithotripsy   KNEE ARTHROSCOPY Right     TONSILLECTOMY AND ADENOIDECTOMY     TUBAL LIGATION      Home Medications:  Allergies as of 12/18/2021       Reactions   Latex Anaphylaxis   Penicillins Nausea And Vomiting        Medication List        Accurate as of December 18, 2021  8:25 AM. If you have any questions, ask your nurse or doctor.          amphetamine-dextroamphetamine 30 MG 24 hr capsule Commonly known as: ADDERALL XR Take 1 capsule (30 mg total) by mouth every morning.   amphetamine-dextroamphetamine 30 MG 24 hr capsule Commonly known as: ADDERALL XR Take 1 capsule (30 mg total) by mouth every morning. Start taking on: January 16, 2022   amphetamine-dextroamphetamine 30 MG 24 hr capsule Commonly known as: ADDERALL XR Take 1 capsule (30 mg total) by mouth every morning. Start taking on: February 16, 2022   cefdinir 300 MG capsule Commonly known as: OMNICEF Take 1 capsule (300 mg total) by mouth 2 (two) times daily for 7 days.   cetirizine 5 MG tablet Commonly known as: ZYRTEC Take 5 mg by mouth daily.   Cholecalciferol 1.25 MG (50000 UT) Tabs Take 1 tablet by mouth once a week.   erythromycin ophthalmic ointment Place 1 Application into both eyes at bedtime for 7  days.   esomeprazole 20 MG capsule Commonly known as: NEXIUM Take 20 mg by mouth daily at 12 noon.   ondansetron 4 MG disintegrating tablet Commonly known as: ZOFRAN-ODT Dissolve 1 tablet (4 mg total) in the mouth every 8 (eight) hours as needed (Take 1 tablet (4 mg total) by mouth every 8 (eight) hours as needed)   spironolactone 100 MG tablet Commonly known as: Aldactone Take 1 tablet (100 mg total) by mouth 2 (two) times daily.   tamsulosin 0.4 MG Caps capsule Commonly known as: FLOMAX Take 1 capsule (0.4 mg total) by mouth daily.   traMADol 50 MG tablet Commonly known as: ULTRAM Take 1 tablet (50 mg total) by mouth every 6 (six) hours as needed for Pain for up to 5 days (Take 1 tablet (50 mg total) by mouth every 6  (six) hours as needed for Pain for up to 5 days)   valACYclovir 1000 MG tablet Commonly known as: VALTREX Take 1 tablet (1,000 mg total) by mouth 2 (two) times daily for 10 days, then stop.  May repeat as needed for flares.   venlafaxine XR 37.5 MG 24 hr capsule Commonly known as: Effexor XR Take 1 capsule (37.5 mg total) by mouth daily with breakfast.   Wegovy 2.4 MG/0.75ML Soaj Generic drug: Semaglutide-Weight Management Inject 2.4 mg into the skin once a week.        Allergies:  Allergies  Allergen Reactions   Latex Anaphylaxis   Penicillins Nausea And Vomiting    Family History: Family History  Problem Relation Age of Onset   Hyperlipidemia Mother    Hypertension Mother    Hypertension Father    Hyperlipidemia Father    Hyperlipidemia Sister    Hyperlipidemia Maternal Grandfather    Hypertension Maternal Grandfather    Hypertension Paternal Grandmother    Hyperlipidemia Paternal Grandmother    Hyperlipidemia Paternal Grandfather    Hypertension Paternal Grandfather     Social History:  reports that she has never smoked. She has never used smokeless tobacco. She reports current alcohol use. She reports that she does not use drugs.  ROS:                                        Physical Exam: LMP 12/03/2021   Constitutional:  Alert and oriented, No acute distress.   Laboratory Data: Lab Results  Component Value Date   WBC 4.4 08/15/2021   HGB 14.4 08/15/2021   HCT 43.4 08/15/2021   MCV 95 08/15/2021   PLT 378 08/15/2021    Lab Results  Component Value Date   CREATININE 0.89 08/15/2021    No results found for: "PSA"  No results found for: "TESTOSTERONE"  Lab Results  Component Value Date   HGBA1C 5.0 08/15/2021    Urinalysis    Component Value Date/Time   COLORURINE AMBER (A) 09/01/2018 1736   APPEARANCEUR Clear 11/23/2021 1449   LABSPEC 1.011 09/01/2018 1736   PHURINE 6.0 09/01/2018 1736   GLUCOSEU Negative  11/23/2021 1449   HGBUR LARGE (A) 09/01/2018 1736   HGBUR moderate 01/03/2007 1430   BILIRUBINUR Negative 11/23/2021 1449   KETONESUR NEGATIVE 09/01/2018 1736   PROTEINUR Negative 11/23/2021 1449   PROTEINUR 100 (A) 09/01/2018 1736   UROBILINOGEN 0.2 02/10/2009 2235   NITRITE Negative 11/23/2021 1449   NITRITE POSITIVE (A) 09/01/2018 1736   LEUKOCYTESUR Negative 11/23/2021 1449   LEUKOCYTESUR LARGE (  A) 09/01/2018 1736    Pertinent Imaging: Urine reviewed.  We will try to get a culture based on volume of urine.  Chart reviewed  Assessment & Plan: I thought it was very reasonable to try Marcaine bladder installations recognizing that the cocktail many years ago was tender.  Multimodality therapy discussed.  Diet discussed.  Role of physical therapy discussed.  Patient already does the diet.  She may do Marcaine bladder installations in the future.  She says she does not want cystoscopy by Dr. Apolinar Junes because she is afraid it will flareup her interstitial cystitis.  Questions regarding installations discussed.  She does not want physical therapy.  We did not try any other cocktails.  She wants to see if her new antidepressant will help and the flare settles down with medication like Azo Standard or Pyridium.  Pyridium prescription sent.  See back in about 4 months.  She was actually well versed on treatments  There are no diagnoses linked to this encounter.  No follow-ups on file.  Martina Sinner, MD  [facility] [address], [address] [place], [place] [address] [phone]

## 2021-12-20 ENCOUNTER — Encounter: Payer: Self-pay | Admitting: Nurse Practitioner

## 2021-12-20 ENCOUNTER — Encounter: Payer: Self-pay | Admitting: Urology

## 2021-12-21 LAB — CULTURE, URINE COMPREHENSIVE

## 2021-12-21 MED ORDER — FLUCONAZOLE 150 MG PO TABS: 150.0000 mg | ORAL_TABLET | Freq: Once | ORAL | 0 refills | Status: AC

## 2021-12-26 ENCOUNTER — Other Ambulatory Visit: Payer: Self-pay | Admitting: *Deleted

## 2021-12-26 ENCOUNTER — Other Ambulatory Visit: Payer: Self-pay | Admitting: Urology

## 2021-12-26 MED ORDER — FLUCONAZOLE 150 MG PO TABS: 150.0000 mg | ORAL_TABLET | Freq: Every day | ORAL | 0 refills | Status: AC

## 2022-01-10 ENCOUNTER — Encounter: Payer: Self-pay | Admitting: Urology

## 2022-01-10 NOTE — Telephone Encounter (Signed)
Ok to schedule treatments or recheck urine?

## 2022-01-15 NOTE — Telephone Encounter (Signed)
Rescue solutions scheduled,  Spoke with patient and advised results

## 2022-01-23 ENCOUNTER — Ambulatory Visit: Payer: Managed Care, Other (non HMO) | Admitting: Physician Assistant

## 2022-01-23 ENCOUNTER — Ambulatory Visit: Payer: Managed Care, Other (non HMO) | Admitting: Urology

## 2022-01-25 ENCOUNTER — Ambulatory Visit: Payer: Managed Care, Other (non HMO) | Admitting: Physician Assistant

## 2022-01-25 ENCOUNTER — Ambulatory Visit: Payer: Managed Care, Other (non HMO) | Admitting: Urology

## 2022-01-25 VITALS — BP 143/81 | Ht 62.0 in | Wt 169.0 lb

## 2022-01-25 DIAGNOSIS — R3129 Other microscopic hematuria: Secondary | ICD-10-CM | POA: Diagnosis not present

## 2022-01-25 DIAGNOSIS — N3010 Interstitial cystitis (chronic) without hematuria: Secondary | ICD-10-CM

## 2022-01-25 DIAGNOSIS — R339 Retention of urine, unspecified: Secondary | ICD-10-CM

## 2022-01-25 LAB — URINALYSIS, COMPLETE
Bilirubin, UA: NEGATIVE
Glucose, UA: NEGATIVE
Ketones, UA: NEGATIVE
Leukocytes,UA: NEGATIVE
Nitrite, UA: NEGATIVE
Protein,UA: NEGATIVE
Specific Gravity, UA: 1.02 (ref 1.005–1.030)
Urobilinogen, Ur: 0.2 mg/dL (ref 0.2–1.0)
pH, UA: 6.5 (ref 5.0–7.5)

## 2022-01-25 LAB — MICROSCOPIC EXAMINATION

## 2022-01-25 MED ORDER — SODIUM BICARBONATE 8.4 % IV SOLN
11.0000 mL | Freq: Once | INTRAVENOUS | Status: AC
  Administered 2022-01-25: 11 mL

## 2022-01-25 NOTE — Progress Notes (Signed)
Bladder Rescue Solution Instillation #1  Due to IC patient is present today for a Rescue Solution Treatment.  Patient was cleaned and prepped in a sterile fashion with betadine.  A 14 FR silicone catheter was inserted, urine return was noted 464ml, urine was light yellow in color.  Instilled a solution consisting of 87ml of Sodium Bicarb, 2 ml Lidocaine and 1 ml of Heparin. The catheter was then removed. Patient tolerated well, no complications were noted.   Performed by: Debroah Loop, PA-C and Mardelle Matte, CMA  Additional notes: Elevated catheterized residual today, obtained ~30 minutes after provided a voided urine specimen. No significant bladder distention on personal review of CT scans dated 11/30/2021, 10/18/2021, and 10/08/2018. Will continue to watch her residual and consider UDS if it remains elevated. She denies constipation.  Follow up: next week for bladder rescue #2

## 2022-01-30 ENCOUNTER — Ambulatory Visit: Payer: Managed Care, Other (non HMO) | Admitting: Physician Assistant

## 2022-01-30 ENCOUNTER — Ambulatory Visit: Payer: Managed Care, Other (non HMO) | Admitting: Urology

## 2022-02-01 ENCOUNTER — Ambulatory Visit: Payer: Managed Care, Other (non HMO) | Admitting: Physician Assistant

## 2022-02-01 ENCOUNTER — Ambulatory Visit: Payer: Managed Care, Other (non HMO) | Admitting: Urology

## 2022-02-01 ENCOUNTER — Encounter: Payer: Self-pay | Admitting: Physician Assistant

## 2022-02-01 VITALS — Ht 62.0 in | Wt 160.0 lb

## 2022-02-01 DIAGNOSIS — N3010 Interstitial cystitis (chronic) without hematuria: Secondary | ICD-10-CM

## 2022-02-01 LAB — URINALYSIS, COMPLETE
Bilirubin, UA: NEGATIVE
Glucose, UA: NEGATIVE
Ketones, UA: NEGATIVE
Leukocytes,UA: NEGATIVE
Nitrite, UA: NEGATIVE
Protein,UA: NEGATIVE
RBC, UA: NEGATIVE
Specific Gravity, UA: 1.015 (ref 1.005–1.030)
Urobilinogen, Ur: 0.2 mg/dL (ref 0.2–1.0)
pH, UA: 7 (ref 5.0–7.5)

## 2022-02-01 LAB — MICROSCOPIC EXAMINATION

## 2022-02-01 LAB — MYCOPLASMA / UREAPLASMA CULTURE
Mycoplasma hominis Culture: NEGATIVE
Ureaplasma urealyticum: NEGATIVE

## 2022-02-01 MED ORDER — SODIUM BICARBONATE 8.4 % IV SOLN
11.0000 mL | Freq: Once | INTRAVENOUS | Status: AC
  Administered 2022-02-01: 11 mL

## 2022-02-01 NOTE — Progress Notes (Signed)
Bladder Rescue Solution Instillation #2  Due to IC patient is present today for a Rescue Solution Treatment.  Patient was cleaned and prepped in a sterile fashion with betadine and lidocaine 2% jelly was instilled into the urethra.  A 14 FR silicone catheter was inserted, urine return was noted 34ml, urine was yellow in color.  Instilled a solution consisting of 67ml of Sodium Bicarb, 2 ml Lidocaine and 1 ml of Heparin. The catheter was then removed. Patient tolerated well, no complications were noted.   Performed by: Debroah Loop, PA-C and Elberta Leatherwood, CMA  Follow up/ Additional Notes: next week for bladder rescue #3

## 2022-02-06 ENCOUNTER — Ambulatory Visit: Payer: Managed Care, Other (non HMO) | Admitting: Urology

## 2022-02-06 ENCOUNTER — Encounter: Payer: Self-pay | Admitting: Physician Assistant

## 2022-02-06 ENCOUNTER — Ambulatory Visit (INDEPENDENT_AMBULATORY_CARE_PROVIDER_SITE_OTHER): Payer: Managed Care, Other (non HMO) | Admitting: Physician Assistant

## 2022-02-06 DIAGNOSIS — N3010 Interstitial cystitis (chronic) without hematuria: Secondary | ICD-10-CM

## 2022-02-06 LAB — URINALYSIS, COMPLETE
Bilirubin, UA: NEGATIVE
Glucose, UA: NEGATIVE
Ketones, UA: NEGATIVE
Leukocytes,UA: NEGATIVE
Nitrite, UA: NEGATIVE
Protein,UA: NEGATIVE
RBC, UA: NEGATIVE
Specific Gravity, UA: <1.005 — ABNORMAL LOW (ref 1.005–1.030)
Urobilinogen, Ur: 0.2 mg/dL (ref 0.2–1.0)
pH, UA: 6.5 (ref 5.0–7.5)

## 2022-02-06 LAB — MICROSCOPIC EXAMINATION

## 2022-02-06 MED ORDER — SODIUM BICARBONATE 8.4 % IV SOLN
11.0000 mL | Freq: Once | INTRAVENOUS | Status: AC
  Administered 2022-02-06: 11 mL

## 2022-02-06 NOTE — Progress Notes (Unsigned)
Bladder Rescue Solution Instillation #3  Due to IC patient is present today for a Rescue Solution Treatment.  Patient was cleaned and prepped in a sterile fashion with betadine and lidocaine 2% jelly was instilled into the urethra.  A 14 FR silicone catheter was inserted, urine return was noted 179ml, urine was clear in color.  Instilled a solution consisting of 35ml of Sodium Bicarb, 2 ml Lidocaine and 1 ml of Heparin. The catheter was then removed. Patient tolerated well, no complications were noted.   Performed by: Debroah Loop, PA-C and Gaspar Cola, CMA  Follow up/ Additional Notes: 2 days for bladder rescue #4

## 2022-02-07 ENCOUNTER — Other Ambulatory Visit: Payer: Self-pay | Admitting: Nurse Practitioner

## 2022-02-07 ENCOUNTER — Encounter: Payer: Self-pay | Admitting: Nurse Practitioner

## 2022-02-07 DIAGNOSIS — R7989 Other specified abnormal findings of blood chemistry: Secondary | ICD-10-CM

## 2022-02-07 NOTE — Telephone Encounter (Signed)
Patient has lab appointment for tomorrow at 9:00 am

## 2022-02-08 ENCOUNTER — Ambulatory Visit: Payer: Managed Care, Other (non HMO) | Admitting: Urology

## 2022-02-08 ENCOUNTER — Encounter: Payer: Self-pay | Admitting: Physician Assistant

## 2022-02-08 ENCOUNTER — Ambulatory Visit: Payer: Managed Care, Other (non HMO) | Admitting: Physician Assistant

## 2022-02-08 ENCOUNTER — Other Ambulatory Visit: Payer: Managed Care, Other (non HMO)

## 2022-02-08 VITALS — BP 134/84 | HR 78 | Ht 62.0 in | Wt 169.4 lb

## 2022-02-08 DIAGNOSIS — N3010 Interstitial cystitis (chronic) without hematuria: Secondary | ICD-10-CM | POA: Diagnosis not present

## 2022-02-08 DIAGNOSIS — R7989 Other specified abnormal findings of blood chemistry: Secondary | ICD-10-CM

## 2022-02-08 MED ORDER — SODIUM BICARBONATE 8.4 % IV SOLN
11.0000 mL | Freq: Once | INTRAVENOUS | Status: AC
  Administered 2022-02-08: 11 mL

## 2022-02-08 NOTE — Progress Notes (Signed)
Bladder Rescue Solution Instillation #4  Due to IC patient is present today for a Rescue Solution Treatment.  Patient was cleaned and prepped in a sterile fashion with betadine and lidocaine 2% jelly was instilled into the urethra.  A 14 FR silicone coude catheter was inserted, urine return was noted 26ml, urine was yellow in color.  Instilled a solution consisting of 46ml of Sodium Bicarb, 2 ml Lidocaine and 1 ml of Heparin. The catheter was then removed. Patient tolerated well, no complications were noted.   Performed by: Debroah Loop, PA-C and Erven Colla, CMA  Follow up/ Additional Notes: Next week for bladder rescue #5

## 2022-02-09 LAB — CBC WITH DIFFERENTIAL/PLATELET
Basophils Absolute: 0 10*3/uL (ref 0.0–0.2)
Basos: 1 %
EOS (ABSOLUTE): 0.1 10*3/uL (ref 0.0–0.4)
Eos: 2 %
Hematocrit: 43.5 % (ref 34.0–46.6)
Hemoglobin: 14.1 g/dL (ref 11.1–15.9)
Immature Grans (Abs): 0 10*3/uL (ref 0.0–0.1)
Immature Granulocytes: 0 %
Lymphocytes Absolute: 2.4 10*3/uL (ref 0.7–3.1)
Lymphs: 45 %
MCH: 30.9 pg (ref 26.6–33.0)
MCHC: 32.4 g/dL (ref 31.5–35.7)
MCV: 95 fL (ref 79–97)
Monocytes Absolute: 0.3 10*3/uL (ref 0.1–0.9)
Monocytes: 6 %
Neutrophils Absolute: 2.4 10*3/uL (ref 1.4–7.0)
Neutrophils: 46 %
Platelets: 384 10*3/uL (ref 150–450)
RBC: 4.57 x10E6/uL (ref 3.77–5.28)
RDW: 12.2 % (ref 11.7–15.4)
WBC: 5.2 10*3/uL (ref 3.4–10.8)

## 2022-02-09 LAB — COMPREHENSIVE METABOLIC PANEL
ALT: 16 IU/L (ref 0–32)
AST: 15 IU/L (ref 0–40)
Albumin/Globulin Ratio: 1.8 (ref 1.2–2.2)
Albumin: 4.4 g/dL (ref 3.9–4.9)
Alkaline Phosphatase: 103 IU/L (ref 44–121)
BUN/Creatinine Ratio: 7 — ABNORMAL LOW (ref 9–23)
BUN: 6 mg/dL (ref 6–20)
Bilirubin Total: 0.6 mg/dL (ref 0.0–1.2)
CO2: 24 mmol/L (ref 20–29)
Calcium: 9.6 mg/dL (ref 8.7–10.2)
Chloride: 102 mmol/L (ref 96–106)
Creatinine, Ser: 0.92 mg/dL (ref 0.57–1.00)
Globulin, Total: 2.5 g/dL (ref 1.5–4.5)
Glucose: 82 mg/dL (ref 70–99)
Potassium: 4.3 mmol/L (ref 3.5–5.2)
Sodium: 140 mmol/L (ref 134–144)
Total Protein: 6.9 g/dL (ref 6.0–8.5)
eGFR: 82 mL/min/{1.73_m2} (ref >59)

## 2022-02-09 LAB — GAMMA GT: GGT: 20 IU/L (ref 0–60)

## 2022-02-09 NOTE — Progress Notes (Signed)
Contacted via [place] morning Jenai, labs have returned and are overall improved from recent labs.  Stable ranges.  Great news!!  Any questions? Keep being awesome!!  Thank you for allowing me to participate in your care.  I appreciate you. Kindest regards, Zandrea Kenealy

## 2022-02-13 ENCOUNTER — Ambulatory Visit: Payer: Managed Care, Other (non HMO) | Admitting: Physician Assistant

## 2022-02-15 ENCOUNTER — Ambulatory Visit: Payer: Managed Care, Other (non HMO) | Admitting: Physician Assistant

## 2022-02-17 NOTE — Patient Instructions (Signed)
Attention Deficit Hyperactivity Disorder, Adult Attention deficit hyperactivity disorder (ADHD) is a mental health disorder that starts during childhood. For many people with ADHD, the disorder continues into the adult years. Treatment can help you manage your symptoms. There are three main types of ADHD: Inattentive. With this type, adults have difficulty paying attention. This may affect cognitive abilities. Hyperactive-impulsive. With this type, adults have a lot of energy and have difficulty controlling their behavior. Combination type. Some people may have symptoms of both types. What are the causes? The exact cause of ADHD is not known. Most experts believe a person's genes and environment possibly contribute to ADHD. What increases the risk? The following factors may make you more likely to develop this condition: Having a first-degree relative such as a parent, brother, or sister, with the condition. Being born before 37 weeks of pregnancy (prematurely) or at a low birth weight. Being born to a mother who smoked tobacco or drank alcohol during pregnancy. Having experienced a brain injury. Being exposed to lead or other toxins in the womb or early in life. What are the signs or symptoms? Symptoms of this condition depend on the type of ADHD. Symptoms of the inattentive type include: Difficulty paying attention or following instructions. Often making simple mistakes. Being disorganized. Avoiding tasks that require time and attention. Losing and forgetting things. Symptoms of the hyperactive-impulsive type include: Restlessness. Talking out of turn, interrupting others, or talking too much. Difficulty with: Sitting still. Feeling motivated. Relaxing. Waiting in line or waiting for a turn. People with the combination type have symptoms of both of the other types. In adults, this condition may lead to certain problems, such as: Keeping jobs. Performing tasks at work. Having  stable relationships. Being on time or keeping to a schedule. How is this diagnosed? This condition is diagnosed based on your current symptoms and your history of symptoms. The diagnosis can be made by a health care provider such as a primary care provider or a mental health care specialist. Your health care provider may use a symptom checklist or a behavior rating scale to evaluate your symptoms. Your health care provider may also want to talk with people who have observed your behaviors throughout your life. How is this treated? This condition can be treated with medicines and behavior therapy. Medicines may be the best option to reduce impulsive behaviors and improve attention. Your health care provider may recommend: Stimulant medicines. These are the most common medicines used for adult ADHD. They affect certain chemicals in the brain (neurotransmitters) and improve your ability to control your symptoms. A non-stimulant medicine. These medicines can also improve focus, attention, and impulsive behavior. It may take weeks to months to see the effects of this medicine. Counseling and behavioral management are also important for treating ADHD. Counseling is often used along with medicine. Your health care provider may suggest: Cognitive behavioral therapy (CBT). This type of therapy teaches you to replace negative thoughts and actions with positive thoughts and actions. When used as part of ADHD treatment, this therapy may also include: Coping strategies for organization, time management, impulse control, and stress reduction. Mindfulness and meditation training. Behavioral management. You may work with a coach who is specially trained to help people with ADHD manage and organize activities and function more effectively. Follow these instructions at home: Medicines  Take over-the-counter and prescription medicines only as told by your health care provider. Talk with your health care provider  about the possible side effects of your medicines and   how to manage them. Alcohol use Do not drink alcohol if: Your health care provider tells you not to drink. You are pregnant, may be pregnant, or are planning to become pregnant. If you drink alcohol: Limit how much you use to: 0-1 drink a day for women. 0-2 drinks a day for men. Know how much alcohol is in your drink. In the [place] one drink equals one 12 oz bottle of beer (355 mL), one 5 oz glass of wine (148 mL), or one 1 oz glass of hard liquor (44 mL). Lifestyle  Do not use illegal drugs. Get enough sleep. Eat a healthy diet. Exercise regularly. Exercise can help to reduce stress and anxiety. General instructions Learn as much as you can about adult ADHD, and work closely with your health care providers to find the treatments that work best for you. Follow the same schedule each day. Use reminder devices like notes, calendars, and phone apps to stay on time and organized. Keep all follow-up visits. Your health care provider will need to monitor your condition and adjust your treatment over time. Where to find more information A health care provider may be able to recommend resources that are available online or over the phone. You could start with: Attention Deficit Disorder Association (ADDA): add.org National Institute of Mental Health (NIMH): nimh.nih.gov Contact a health care provider if: Your symptoms continue to cause problems. You have side effects from your medicine, such as: Repeated muscle twitches, coughing, or speech outbursts. Sleep problems. Loss of appetite. Dizziness. Unusually fast heartbeat. Stomach pains. Headaches. You are struggling with anxiety, depression, or substance abuse. Get help right away if: You have a severe reaction to a medicine. This symptom may be an emergency. Get help right away. Call 911. Do not wait to see if the symptom will go away. Do not drive yourself to the hospital. Take  one of these steps if you feel like you may hurt yourself or others, or have thoughts about taking your own life: Go to your nearest emergency room. Call 911. Call the National Suicide Prevention Lifeline at 1-800-273-8255 or 988. This is open 24 hours a day Text the Crisis Text Line at 741741. Summary ADHD is a mental health disorder that starts during childhood and often continues into your adult years. The exact cause of ADHD is not known. Most experts believe genetics and environmental factors contribute to ADHD. There is no cure for ADHD, but treatment with medicine, cognitive behavioral therapy, or behavioral management can help you manage your condition. This information is not intended to replace advice given to you by your health care provider. Make sure you discuss any questions you have with your health care provider. Document Revised: 07/14/2021 Document Reviewed: 07/14/2021 Elsevier Patient Education  2023 Elsevier Inc.  

## 2022-02-19 ENCOUNTER — Ambulatory Visit (INDEPENDENT_AMBULATORY_CARE_PROVIDER_SITE_OTHER): Payer: Managed Care, Other (non HMO) | Admitting: Nurse Practitioner

## 2022-02-19 ENCOUNTER — Encounter: Payer: Self-pay | Admitting: Nurse Practitioner

## 2022-02-19 VITALS — BP 104/69 | HR 91 | Temp 98.1°F | Ht 62.01 in | Wt 168.6 lb

## 2022-02-19 DIAGNOSIS — F418 Other specified anxiety disorders: Secondary | ICD-10-CM | POA: Diagnosis not present

## 2022-02-19 DIAGNOSIS — F902 Attention-deficit hyperactivity disorder, combined type: Secondary | ICD-10-CM

## 2022-02-19 DIAGNOSIS — E6609 Other obesity due to excess calories: Secondary | ICD-10-CM | POA: Diagnosis not present

## 2022-02-19 DIAGNOSIS — Z6837 Body mass index (BMI) 37.0-37.9, adult: Secondary | ICD-10-CM

## 2022-02-19 MED ORDER — SEMAGLUTIDE-WEIGHT MANAGEMENT 1.7 MG/0.75ML ~~LOC~~ SOAJ: 1.7000 mg | SUBCUTANEOUS | 5 refills | Status: DC

## 2022-02-19 MED ORDER — AMPHETAMINE-DEXTROAMPHET ER 30 MG PO CP24: 30.0000 mg | ORAL_CAPSULE | ORAL | 0 refills | Status: DC

## 2022-02-19 MED ORDER — AMPHETAMINE-DEXTROAMPHET ER 30 MG PO CP24: 30.0000 mg | ORAL_CAPSULE | Freq: Every morning | ORAL | 0 refills | Status: DC

## 2022-02-19 NOTE — Assessment & Plan Note (Signed)
Chronic, stable with improvement in mood reported.  Denies SI/HI.  Continue Effexor at current dosing and adjust as needed.  Recommend meditation and relaxation exercises at home.

## 2022-02-19 NOTE — Assessment & Plan Note (Signed)
BMI 31.59, has lost 74 pounds thus far and is taking Wegovy 2.4 MG.  Decrease dosing to 1.7 MG dosing weekly to maintain as she is concerned about cost in new year.  This will offer benefit to PCOS and overall health.  Recommended eating smaller high protein, low fat meals more frequently and exercising 30 mins a day 5 times a week with a goal of 10-15lb weight loss in the next 3 months. Patient voiced their understanding and motivation to adhere to these recommendations.

## 2022-02-19 NOTE — Progress Notes (Signed)
BP 104/69   Pulse 91   Temp 98.1 F (36.7 C)   Ht 5' 2.01" (1.575 m)   Wt 168 lb 9.6 oz (76.5 kg)   SpO2 99%   BMI 30.83 kg/m    Subjective:    Patient ID: [name], female    DOB: [birth year], 36 y.o.   MRN: [id number]  HPI: [name] is a 36 y.o. 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  PDMP last fill was 02/01/22.     Currently taking Wegovy 2.4 MG -- started February 2023 (with Ozempic).  Has lost 74 pounds since starting medication -- in December was 242 lbs at home, however this check due to stressors did gain a small amount. Working on exercise and diet changes as well.  Has PCOS. Would like to reduce dosing as is concerned about coverage and cost in new year. ADHD status: controlled Satisfied with current therapy: yes Medication compliance:  good compliance Controlled substance contract: yes Previous psychiatry evaluation: no Previous medications: yes adderall and adderall XR   Taking meds on weekends/vacations: yes Work/school performance:  good Difficulty sustaining attention/completing tasks: no Distracted by extraneous stimuli: no Does not listen when spoken to: no  Fidgets with hands or feet: no Unable to stay in seat: no Blurts out/interrupts others: no ADHD Medication Side Effects: no    Decreased appetite: no    Headache: no    Sleeping disturbance pattern: no    Irritability: no    Rebound effects (worse than baseline) off medication: no    Anxiousness: no    Dizziness: no    Tics: no   ANXIETY/STRESS She is taking Effexor 37.5 MG for anxiety, is tolerating this well.  Noticing improved mood with this. Duration: stable Anxious mood: improving Excessive worrying: improving Irritability:  improving Sweating: no Nausea: no Palpitations:no Hyperventilation: no Panic attacks: improving Agoraphobia: no  Obscessions/compulsions: no Depressed mood: improving    23-Feb-2022    3:42 PM 12/05/2021    8:33 AM 08/15/2021   11:21 AM 05/18/2021   11:03 AM  Depression screen PHQ 2/9  Decreased Interest _0 0  Down, Depressed, Hopeless _1 0  PHQ - 2 Score _2 0  Altered sleeping _3 Tired, decreased energy _4 Change in appetite _5 Feeling bad or failure about yourself  1 3 0 1  Trouble concentrating _6 Moving slowly or fidgety/restless 0 1 0 0  Suicidal thoughts 0 0 0 0  PHQ-9 Score _7 Difficult doing work/chores  Somewhat difficult    Anhedonia: no Weight changes: no Insomnia: yes hard to fall asleep  Hypersomnia: no Fatigue/loss of energy: no Feelings of worthlessness: no Feelings of guilt: no Impaired concentration/indecisiveness: no Suicidal ideations: no  Crying spells: no Recent Stressors/Life Changes: yes   Relationship problems: no   Family stress: yes     Financial stress: yes    Job stress: no    Recent death/loss: no     2022/02/23    3:42 PM 12/05/2021    8:34 AM 08/15/2021   11:22 AM 05/18/2021  11:03 AM  GAD 7 : Generalized Anxiety Score  Nervous, Anxious, on Edge _0 0  Control/stop worrying _1 Worry too much - different things _2 Trouble relaxing _3 Restless 1 1 0 0  Easily annoyed or irritable _4 0  Afraid - awful might happen 0 1 1 0  Total GAD 7 Score _5 Anxiety Difficulty Somewhat difficult Somewhat difficult Somewhat difficult      Relevant past medical, surgical, family and social history reviewed and updated as indicated. Interim medical history since our last visit reviewed. Allergies and medications reviewed and updated.  Review of Systems  Constitutional:  Negative for activity change, appetite change, diaphoresis, fatigue and fever.  Respiratory:  Negative for cough,  chest tightness and shortness of breath.   Cardiovascular:  Negative for chest pain, palpitations and leg swelling.  Gastrointestinal: Negative.   Neurological: Negative.   Psychiatric/Behavioral:  Positive for decreased concentration and sleep disturbance. Negative for self-injury and suicidal ideas. The patient is nervous/anxious.     Per HPI unless specifically indicated above     Objective:    BP 104/69   Pulse 91   Temp 98.1 F (36.7 C)   Ht 5' 2.01" (1.575 m)   Wt 168 lb 9.6 oz (76.5 kg)   SpO2 99%   BMI 30.83 kg/m   Wt Readings from Last 3 Encounters:  02/19/22 168 lb 9.6 oz (76.5 kg)  02/08/22 169 lb 6.4 oz (76.8 kg)  02/01/22 160 lb (72.6 kg)    Physical Exam Vitals and nursing note reviewed.  Constitutional:      General: She is awake. She is not in acute distress.    Appearance: She is well-developed and well-groomed. She is obese. She is not ill-appearing or toxic-appearing.  HENT:     Head: Normocephalic.     Right Ear: Hearing normal.     Left Ear: Hearing normal.     Nose: Nose normal.     Mouth/Throat:     Mouth: Mucous membranes are moist.  Eyes:     General: Lids are normal.        Right eye: No discharge.        Left eye: No discharge.     Conjunctiva/sclera: Conjunctivae normal.     Pupils: Pupils are equal, round, and reactive to light.  Neck:     Thyroid: No thyromegaly.     Vascular: No carotid bruit or JVD.  Cardiovascular:     Rate and Rhythm: Normal rate and regular rhythm.     Heart sounds: Normal heart sounds. No murmur heard.    No gallop.  Pulmonary:     Effort: Pulmonary effort is normal.     Breath sounds: Normal breath sounds.  Abdominal:     General: Bowel sounds are normal.     Palpations: Abdomen is soft.  Musculoskeletal:     Cervical back: Normal range of motion and neck supple.     Right lower leg: No edema.     Left lower leg: No edema.  Lymphadenopathy:     Cervical: No cervical adenopathy.  Skin:    General:  Skin is warm and dry.  Neurological:     Mental Status: She is alert and oriented to person, place, and time.  Psychiatric:        Attention and Perception: Attention normal.        Mood  and Affect: Mood normal.        Behavior: Behavior normal. Behavior is cooperative.        Thought Content: Thought content normal.        Judgment: Judgment normal.    Results for orders placed or performed in visit on 02/08/22  Gamma GT  Result Value Ref Range   GGT 20 0 - 60 IU/L  Comprehensive metabolic panel  Result Value Ref Range   Glucose 82 70 - 99 mg/dL   BUN 6 6 - 20 mg/dL   Creatinine, Ser 0.92 0.57 - 1.00 mg/dL   eGFR 82 >59 mL/min/1.73   BUN/Creatinine Ratio 7 (L) 9 - 23   Sodium 140 134 - 144 mmol/L   Potassium 4.3 3.5 - 5.2 mmol/L   Chloride 102 96 - 106 mmol/L   CO2 24 20 - 29 mmol/L   Calcium 9.6 8.7 - 10.2 mg/dL   Total Protein 6.9 6.0 - 8.5 g/dL   Albumin 4.4 3.9 - 4.9 g/dL   Globulin, Total 2.5 1.5 - 4.5 g/dL   Albumin/Globulin Ratio 1.8 1.2 - 2.2   Bilirubin Total 0.6 0.0 - 1.2 mg/dL   Alkaline Phosphatase 103 44 - 121 IU/L   AST 15 0 - 40 IU/L   ALT 16 0 - 32 IU/L  CBC with Differential/Platelet  Result Value Ref Range   WBC 5.2 3.4 - 10.8 x10E3/uL   RBC 4.57 3.77 - 5.28 x10E6/uL   Hemoglobin 14.1 11.1 - 15.9 g/dL   Hematocrit 43.5 34.0 - 46.6 %   MCV 95 79 - 97 fL   MCH 30.9 26.6 - 33.0 pg   MCHC 32.4 31.5 - 35.7 g/dL   RDW 12.2 11.7 - 15.4 %   Platelets 384 150 - 450 x10E3/uL   Neutrophils 46 Not Estab. %   Lymphs 45 Not Estab. %   Monocytes 6 Not Estab. %   Eos 2 Not Estab. %   Basos 1 Not Estab. %   Neutrophils Absolute 2.4 1.4 - 7.0 x10E3/uL   Lymphocytes Absolute 2.4 0.7 - 3.1 x10E3/uL   Monocytes Absolute 0.3 0.1 - 0.9 x10E3/uL   EOS (ABSOLUTE) 0.1 0.0 - 0.4 x10E3/uL   Basophils Absolute 0.0 0.0 - 0.2 x10E3/uL   Immature Granulocytes 0 Not Estab. %   Immature Grans (Abs) 0.0 0.0 - 0.1 x10E3/uL      Assessment & Plan:   Problem List Items  Addressed This Visit       Other   ADHD (attention deficit hyperactivity disorder) - Primary    Ongoing, starting in childhood.  Will continue Adderall XR 30 MG daily, to assist her with school and working full-time.  In past did not tolerate Ritalin or Concerta per her report, will avoid these.  UDS obtain today and contract up to date.  She is aware of need for every 3 month visits and yearly UDS, agrees with this plan.  Predated refills sent for the next 3 months.      Relevant Orders   [id number] 11+Oxyco+Alc+Crt-Bund   Obesity    BMI 31.59, has lost 74 pounds thus far and is taking Wegovy 2.4 MG.  Decrease dosing to 1.7 MG dosing weekly to maintain as she is concerned about cost in new year.  This will offer benefit to PCOS and overall health.  Recommended eating smaller high protein, low fat meals more frequently and exercising 30 mins a day 5 times a week with a goal of 10-15lb weight loss in  the next 3 months. Patient voiced their understanding and motivation to adhere to these recommendations.         Relevant Medications   Semaglutide-Weight Management 1.7 MG/0.75ML SOAJ   amphetamine-dextroamphetamine (ADDERALL XR) 30 MG 24 hr capsule (Start on 04/30/2022)   amphetamine-dextroamphetamine (ADDERALL XR) 30 MG 24 hr capsule (Start on 03/30/2022)   amphetamine-dextroamphetamine (ADDERALL XR) 30 MG 24 hr capsule (Start on 03/02/2022)   Situational anxiety    Chronic, stable with improvement in mood reported.  Denies SI/HI.  Continue Effexor at current dosing and adjust as needed.  Recommend meditation and relaxation exercises at home.          Follow up plan: Return in about 3 months (around 05/22/2022) for ADHD & WEIGHT CHECK.

## 2022-02-19 NOTE — Assessment & Plan Note (Signed)
Ongoing, starting in childhood.  Will continue Adderall XR 30 MG daily, to assist her with school and working full-time.  In past did not tolerate Ritalin or Concerta per her report, will avoid these.  UDS obtain today and contract up to date.  She is aware of need for every 3 month visits and yearly UDS, agrees with this plan.  Predated refills sent for the next 3 months.

## 2022-02-22 LAB — DRUG SCREEN 764883 11+OXYCO+ALC+CRT-BUND
BENZODIAZ UR QL: NEGATIVE ng/mL
Barbiturate: NEGATIVE ng/mL
Cannabinoid Quant, Ur: NEGATIVE ng/mL
Cocaine (Metabolite): NEGATIVE ng/mL
Creatinine: 64.3 mg/dL (ref 20.0–300.0)
Ethanol: NEGATIVE %
Meperidine: NEGATIVE ng/mL
Methadone Screen, Urine: NEGATIVE ng/mL
OPIATE SCREEN URINE: NEGATIVE ng/mL
Oxycodone/Oxymorphone, Urine: NEGATIVE ng/mL
Phencyclidine: NEGATIVE ng/mL
Propoxyphene: NEGATIVE ng/mL
Tramadol: NEGATIVE ng/mL
pH, Urine: 6.5 (ref 4.5–8.9)

## 2022-02-22 LAB — DRUG PROFILE 799016
Amphetamine GC/MS Conf: >3000 ng/mL
Amphetamine: POSITIVE — AB
Amphetamines: POSITIVE — AB
Methamphetamine: NEGATIVE

## 2022-05-14 ENCOUNTER — Telehealth: Payer: Self-pay | Admitting: Nurse Practitioner

## 2022-05-20 NOTE — Patient Instructions (Signed)
Attention Deficit Hyperactivity Disorder, Adult Attention deficit hyperactivity disorder (ADHD) is a mental health disorder that starts during childhood. For many people with ADHD, the disorder continues into the adult years. Treatment can help you manage your symptoms. There are three main types of ADHD: Inattentive. With this type, adults have difficulty paying attention. This may affect cognitive abilities. Hyperactive-impulsive. With this type, adults have a lot of energy and have difficulty controlling their behavior. Combination type. Some people may have symptoms of both types. What are the causes? The exact cause of ADHD is not known. Most experts believe a person's genes and environment possibly contribute to ADHD. What increases the risk? The following factors may make you more likely to develop this condition: Having a first-degree relative such as a parent, brother, or sister, with the condition. Being born before 37 weeks of pregnancy (prematurely) or at a low birth weight. Being born to a mother who smoked tobacco or drank alcohol during pregnancy. Having experienced a brain injury. Being exposed to lead or other toxins in the womb or early in life. What are the signs or symptoms? Symptoms of this condition depend on the type of ADHD. Symptoms of the inattentive type include: Difficulty paying attention or following instructions. Often making simple mistakes. Being disorganized. Avoiding tasks that require time and attention. Losing and forgetting things. Symptoms of the hyperactive-impulsive type include: Restlessness. Talking out of turn, interrupting others, or talking too much. Difficulty with: Sitting still. Feeling motivated. Relaxing. Waiting in line or waiting for a turn. People with the combination type have symptoms of both of the other types. In adults, this condition may lead to certain problems, such as: Keeping jobs. Performing tasks at work. Having  stable relationships. Being on time or keeping to a schedule. How is this diagnosed? This condition is diagnosed based on your current symptoms and your history of symptoms. The diagnosis can be made by a health care provider such as a primary care provider or a mental health care specialist. Your health care provider may use a symptom checklist or a behavior rating scale to evaluate your symptoms. Your health care provider may also want to talk with people who have observed your behaviors throughout your life. How is this treated? This condition can be treated with medicines and behavior therapy. Medicines may be the best option to reduce impulsive behaviors and improve attention. Your health care provider may recommend: Stimulant medicines. These are the most common medicines used for adult ADHD. They affect certain chemicals in the brain (neurotransmitters) and improve your ability to control your symptoms. A non-stimulant medicine. These medicines can also improve focus, attention, and impulsive behavior. It may take weeks to months to see the effects of this medicine. Counseling and behavioral management are also important for treating ADHD. Counseling is often used along with medicine. Your health care provider may suggest: Cognitive behavioral therapy (CBT). This type of therapy teaches you to replace negative thoughts and actions with positive thoughts and actions. When used as part of ADHD treatment, this therapy may also include: Coping strategies for organization, time management, impulse control, and stress reduction. Mindfulness and meditation training. Behavioral management. You may work with a coach who is specially trained to help people with ADHD manage and organize activities and function more effectively. Follow these instructions at home: Medicines  Take over-the-counter and prescription medicines only as told by your health care provider. Talk with your health care provider  about the possible side effects of your medicines and   how to manage them. Alcohol use Do not drink alcohol if: Your health care provider tells you not to drink. You are pregnant, may be pregnant, or are planning to become pregnant. If you drink alcohol: Limit how much you use to: 0-1 drink a day for women. 0-2 drinks a day for men. Know how much alcohol is in your drink. In the [place] one drink equals one 12 oz bottle of beer (355 mL), one 5 oz glass of wine (148 mL), or one 1 oz glass of hard liquor (44 mL). Lifestyle  Do not use illegal drugs. Get enough sleep. Eat a healthy diet. Exercise regularly. Exercise can help to reduce stress and anxiety. General instructions Learn as much as you can about adult ADHD, and work closely with your health care providers to find the treatments that work best for you. Follow the same schedule each day. Use reminder devices like notes, calendars, and phone apps to stay on time and organized. Keep all follow-up visits. Your health care provider will need to monitor your condition and adjust your treatment over time. Where to find more information A health care provider may be able to recommend resources that are available online or over the phone. You could start with: Attention Deficit Disorder Association (ADDA): add.org National Institute of Mental Health (NIMH): nimh.nih.gov Contact a health care provider if: Your symptoms continue to cause problems. You have side effects from your medicine, such as: Repeated muscle twitches, coughing, or speech outbursts. Sleep problems. Loss of appetite. Dizziness. Unusually fast heartbeat. Stomach pains. Headaches. You are struggling with anxiety, depression, or substance abuse. Get help right away if: You have a severe reaction to a medicine. This symptom may be an emergency. Get help right away. Call 911. Do not wait to see if the symptom will go away. Do not drive yourself to the hospital. Take  one of these steps if you feel like you may hurt yourself or others, or have thoughts about taking your own life: Go to your nearest emergency room. Call 911. Call the National Suicide Prevention Lifeline at 1-800-273-8255 or 988. This is open 24 hours a day Text the Crisis Text Line at 741741. Summary ADHD is a mental health disorder that starts during childhood and often continues into your adult years. The exact cause of ADHD is not known. Most experts believe genetics and environmental factors contribute to ADHD. There is no cure for ADHD, but treatment with medicine, cognitive behavioral therapy, or behavioral management can help you manage your condition. This information is not intended to replace advice given to you by your health care provider. Make sure you discuss any questions you have with your health care provider. Document Revised: 07/14/2021 Document Reviewed: 07/14/2021 Elsevier Patient Education  2023 Elsevier Inc.  

## 2022-05-21 ENCOUNTER — Encounter: Payer: Self-pay | Admitting: Nurse Practitioner

## 2022-05-21 ENCOUNTER — Telehealth (INDEPENDENT_AMBULATORY_CARE_PROVIDER_SITE_OTHER): Payer: BC Managed Care – PPO | Admitting: Nurse Practitioner

## 2022-05-21 VITALS — Wt 178.0 lb

## 2022-05-21 DIAGNOSIS — Z6837 Body mass index (BMI) 37.0-37.9, adult: Secondary | ICD-10-CM | POA: Diagnosis not present

## 2022-05-21 DIAGNOSIS — E6609 Other obesity due to excess calories: Secondary | ICD-10-CM

## 2022-05-21 DIAGNOSIS — F902 Attention-deficit hyperactivity disorder, combined type: Secondary | ICD-10-CM

## 2022-05-21 MED ORDER — AMPHETAMINE-DEXTROAMPHET ER 30 MG PO CP24: 30.0000 mg | ORAL_CAPSULE | Freq: Every morning | ORAL | 0 refills | Status: DC

## 2022-05-21 MED ORDER — AMPHETAMINE-DEXTROAMPHET ER 30 MG PO CP24: 30.0000 mg | ORAL_CAPSULE | ORAL | 0 refills | Status: DC

## 2022-05-21 NOTE — Progress Notes (Signed)
Wt 178 lb (80.7 kg)   BMI 32.55 kg/m    Subjective:    Patient ID: [name], female    DOB: 05/12/1985, 37 y.o.   MRN: [id number]  HPI: [name] is a 37 y.o. female  Chief Complaint  Patient presents with   Med Change Request    Patient says she would like to discuss different treatment options as her insurance no longer covers her medication. Patient says her insurance no longer covers the [place] and she thinks they will cover the [facility].   This visit was completed via telephone due to the restrictions of the COVID-19 pandemic. All issues as above were discussed and addressed but no physical exam was performed. If it was felt that the patient should be evaluated in the office, they were directed there. The patient verbally consented to this visit. Patient was unable to complete an audio/visual visit due to Technical difficulties", "Lack of internet. Due to the catastrophic nature of the COVID-19 pandemic, this visit was done through audio contact only. Location of the patient: home Location of the provider: work Those involved with this call:  Provider: Marnee Guarneri, DNP CMA: Irena Reichmann, [place] Desk/Registration: [employer]  Time spent on call:  21 minutes on the phone discussing health concerns. 15 minutes total spent in review of patient's record and preparation of their chart.  I verified patient identity using two factors (patient name and date of birth). Patient consents verbally to being seen via telemedicine visit today.    ADHD FOLLOW UP + PCOS (WEIGHT LOSS) Continues on Adderall XR 30 MG daily, which offers her benefit.  PDMP last fill was 04/19/22.   Currently taking Wegovy 2.4 MG -- started February 2023 (with Ozempic), however insurance is no longer going to cover -- she thinks they may cover [place]. Had lost 74 pounds since starting medication, has gained back 13 pounds -- last Wegovy shot on Friday, she has been doing every 2 weeks to  hold on to medication. Working on exercise and diet changes as well.  Has PCOS.  ADHD status: controlled Satisfied with current therapy: yes Medication compliance:  good compliance Controlled substance contract: yes Previous psychiatry evaluation: no Previous medications: yes adderall and adderall XR   Taking meds on weekends/vacations: yes Work/school performance:  good Difficulty sustaining attention/completing tasks: no Distracted by extraneous stimuli: no Does not listen when spoken to: no  Fidgets with hands or feet: no Unable to stay in seat: no Blurts out/interrupts others: no ADHD Medication Side Effects: no    Decreased appetite: no    Headache: no    Sleeping disturbance pattern: no    Irritability: no    Rebound effects (worse than baseline) off medication: no    Anxiousness: no    Dizziness: no    Tics: no   Relevant past medical, surgical, family and social history reviewed and updated as indicated. Interim medical history since our last visit reviewed. Allergies and medications reviewed and updated.  Review of Systems  Constitutional:  Negative for activity change, appetite change, diaphoresis, fatigue and fever.  Respiratory:  Negative for cough, chest tightness and shortness of breath.   Cardiovascular:  Negative for chest pain, palpitations and leg swelling.  Gastrointestinal: Negative.   Neurological: Negative.   Psychiatric/Behavioral:  Negative for decreased concentration, self-injury, sleep disturbance and suicidal ideas. The patient is not nervous/anxious.    Per HPI unless specifically indicated above     Objective:    Wt  178 lb (80.7 kg)   BMI 32.55 kg/m   Wt Readings from Last 3 Encounters:  05/21/22 178 lb (80.7 kg)  02/19/22 168 lb 9.6 oz (76.5 kg)  02/08/22 169 lb 6.4 oz (76.8 kg)    Physical Exam  Unable to perform due to poor connection on patient end.  Results for orders placed or performed in visit on 02/19/22  [id number]  11+Oxyco+Alc+Crt-Bund  Result Value Ref Range   Ethanol Negative Cutoff=0.020 %   Amphetamines, Urine See Final Results Cutoff=1000 ng/mL   Barbiturate Negative Cutoff=200 ng/mL   BENZODIAZ UR QL Negative Cutoff=200 ng/mL   Cannabinoid Quant, Ur Negative Cutoff=50 ng/mL   Cocaine (Metabolite) Negative Cutoff=300 ng/mL   OPIATE SCREEN URINE Negative Cutoff=300 ng/mL   Oxycodone/Oxymorphone, Urine Negative Cutoff=300 ng/mL   Phencyclidine Negative Cutoff=25 ng/mL   Methadone Screen, Urine Negative Cutoff=300 ng/mL   Propoxyphene Negative Cutoff=300 ng/mL   Meperidine Negative Cutoff=200 ng/mL   Tramadol Negative Cutoff=200 ng/mL   Creatinine 64.3 20.0 - 300.0 mg/dL   pH, Urine 6.5 4.5 - 8.9  Drug Profile [id number]  Result Value Ref Range   Amphetamines Positive (A) Cutoff=1000   Amphetamine Positive (A)    Amphetamine GC/MS Conf >3000 Cutoff=500 ng/mL   Methamphetamine Negative Cutoff=500      Assessment & Plan:   Problem List Items Addressed This Visit       Other   ADHD (attention deficit hyperactivity disorder) - Primary    Ongoing, starting in childhood.  Will continue Adderall XR 30 MG daily, to assist her with school and working full-time.  In past did not tolerate Ritalin or Concerta per her report, will avoid these.  UDS due next 02/20/23 and contract up to date.  She is aware of need for every 3 month visits and yearly UDS, agrees with this plan.  Predated refills sent for the next 3 months.      Obesity    BMI 32.55, had lost 74 pounds with Wegovy 2.4 MG.  This offered benefit to PCOS and overall health.  Insurance no longer covering [place] -- encouraged her to reach out to insurance to see what weight loss medications will be covered.  Then alert provider in [place].  Discussed with her possibility of Topamax.  Recommended eating smaller high protein, low fat meals more frequently and exercising 30 mins a day 5 times a week with a goal of 10-15lb weight loss in the next 3  months. Patient voiced their understanding and motivation to adhere to these recommendations.         Relevant Medications   amphetamine-dextroamphetamine (ADDERALL XR) 30 MG 24 hr capsule   amphetamine-dextroamphetamine (ADDERALL XR) 30 MG 24 hr capsule (Start on 06/19/2022)   amphetamine-dextroamphetamine (ADDERALL XR) 30 MG 24 hr capsule (Start on 07/20/2022)    I discussed the assessment and treatment plan with the patient. The patient was provided an opportunity to ask questions and all were answered. The patient agreed with the plan and demonstrated an understanding of the instructions.   The patient was advised to call back or seek an in-person evaluation if the symptoms worsen or if the condition fails to improve as anticipated.   I provided 21+ minutes of time during this encounter.    Follow up plan: Return in about 3 months (around 08/19/2022) for ADHD and WEIGHT.

## 2022-05-21 NOTE — Assessment & Plan Note (Signed)
Ongoing, starting in childhood.  Will continue Adderall XR 30 MG daily, to assist her with school and working full-time.  In past did not tolerate Ritalin or Concerta per her report, will avoid these.  UDS due next 02/20/23 and contract up to date.  She is aware of need for every 3 month visits and yearly UDS, agrees with this plan.  Predated refills sent for the next 3 months.

## 2022-05-21 NOTE — Progress Notes (Signed)
Pt scheduled  

## 2022-05-21 NOTE — Assessment & Plan Note (Signed)
BMI 32.55, had lost 74 pounds with Wegovy 2.4 MG.  This offered benefit to PCOS and overall health.  Insurance no longer covering [place] -- encouraged her to reach out to insurance to see what weight loss medications will be covered.  Then alert provider in [place].  Discussed with her possibility of Topamax.  Recommended eating smaller high protein, low fat meals more frequently and exercising 30 mins a day 5 times a week with a goal of 10-15lb weight loss in the next 3 months. Patient voiced their understanding and motivation to adhere to these recommendations.

## 2022-05-22 ENCOUNTER — Telehealth: Payer: Managed Care, Other (non HMO) | Admitting: Nurse Practitioner

## 2022-06-05 ENCOUNTER — Encounter: Payer: Self-pay | Admitting: Nurse Practitioner

## 2022-06-08 MED ORDER — TOPIRAMATE 25 MG PO TABS: 25.0000 mg | ORAL_TABLET | Freq: Every day | ORAL | 2 refills | Status: DC

## 2022-06-08 NOTE — Addendum Note (Signed)
Addended by: Marnee Guarneri T on: 06/08/2022 01:02 PM   Modules accepted: Orders

## 2022-06-21 ENCOUNTER — Other Ambulatory Visit: Payer: Self-pay | Admitting: Nurse Practitioner

## 2022-06-22 NOTE — Telephone Encounter (Signed)
Requested Prescriptions  Pending Prescriptions Disp Refills   venlafaxine XR (EFFEXOR-XR) 37.5 MG 24 hr capsule [Pharmacy Med Name: VENLAFAXINE HCL ER 37.5 MG CAP] 90 capsule 0    Sig: TAKE 1 CAPSULE BY MOUTH EVERY DAY WITH BREAKFAST     Psychiatry: Antidepressants - SNRI - desvenlafaxine & venlafaxine Failed - 06/21/2022  1:13 PM      Failed - Lipid Panel in normal range within the last 12 months    Cholesterol, Total  Date Value Ref Range Status  08/15/2021 140 100 - 199 mg/dL Final   LDL Chol Calc (NIH)  Date Value Ref Range Status  08/15/2021 80 0 - 99 mg/dL Final   HDL  Date Value Ref Range Status  08/15/2021 46 >39 mg/dL Final   Triglycerides  Date Value Ref Range Status  08/15/2021 71 0 - 149 mg/dL Final         Passed - Cr in normal range and within 360 days    Creatinine  Date Value Ref Range Status  02/19/2022 64.3 20.0 - 300.0 mg/dL Final   Creatinine, Ser  Date Value Ref Range Status  02/08/2022 0.92 0.57 - 1.00 mg/dL Final   Creatinine,U  Date Value Ref Range Status  05/07/2008 232.3 mg/dL Final    Comment:    See lab report for associated comment(s)         Passed - Last BP in normal range    BP Readings from Last 1 Encounters:  02/19/22 104/69         Passed - Valid encounter within last 6 months    Recent Outpatient Visits           1 month ago Attention deficit hyperactivity disorder (ADHD), combined type   [place], Henrine Screws T, NP   4 months ago Attention deficit hyperactivity disorder (ADHD), combined type   [place], Anton Ruiz T, NP   6 months ago Hordeolum externum of left lower eyelid   [place], [place] T, NP   6 months ago Class 2 obesity due to excess calories without serious comorbidity with body mass index (BMI) of 37.0 to 37.9 in adult   [place], Barbaraann Faster, NP   10 months ago Attention deficit  hyperactivity disorder (ADHD), combined type   [place], Barbaraann Faster, NP       Future Appointments             In 2 months [place], Barbaraann Faster, NP [place], [facility]

## 2022-07-11 ENCOUNTER — Encounter: Payer: Self-pay | Admitting: Nurse Practitioner

## 2022-07-11 ENCOUNTER — Other Ambulatory Visit: Payer: Self-pay | Admitting: Nurse Practitioner

## 2022-07-11 MED ORDER — TOPIRAMATE 50 MG PO TABS: 50.0000 mg | ORAL_TABLET | Freq: Every day | ORAL | 2 refills | Status: DC

## 2022-08-18 NOTE — Patient Instructions (Signed)
Attention Deficit Hyperactivity Disorder, Adult Attention deficit hyperactivity disorder (ADHD) is a mental health disorder that starts during childhood. For many people with ADHD, the disorder continues into the adult years. Treatment can help you manage your symptoms. There are three main types of ADHD: Inattentive. With this type, adults have difficulty paying attention. This may affect cognitive abilities. Hyperactive-impulsive. With this type, adults have a lot of energy and have difficulty controlling their behavior. Combination type. Some people may have symptoms of both types. What are the causes? The exact cause of ADHD is not known. Most experts believe a person's genes and environment possibly contribute to ADHD. What increases the risk? The following factors may make you more likely to develop this condition: Having a first-degree relative such as a parent, brother, or sister, with the condition. Being born before 37 weeks of pregnancy (prematurely) or at a low birth weight. Being born to a mother who smoked tobacco or drank alcohol during pregnancy. Having experienced a brain injury. Being exposed to lead or other toxins in the womb or early in life. What are the signs or symptoms? Symptoms of this condition depend on the type of ADHD. Symptoms of the inattentive type include: Difficulty paying attention or following instructions. Often making simple mistakes. Being disorganized. Avoiding tasks that require time and attention. Losing and forgetting things. Symptoms of the hyperactive-impulsive type include: Restlessness. Talking out of turn, interrupting others, or talking too much. Difficulty with: Sitting still. Feeling motivated. Relaxing. Waiting in line or waiting for a turn. People with the combination type have symptoms of both of the other types. In adults, this condition may lead to certain problems, such as: Keeping jobs. Performing tasks at work. Having  stable relationships. Being on time or keeping to a schedule. How is this diagnosed? This condition is diagnosed based on your current symptoms and your history of symptoms. The diagnosis can be made by a health care provider such as a primary care provider or a mental health care specialist. Your health care provider may use a symptom checklist or a behavior rating scale to evaluate your symptoms. Your health care provider may also want to talk with people who have observed your behaviors throughout your life. How is this treated? This condition can be treated with medicines and behavior therapy. Medicines may be the best option to reduce impulsive behaviors and improve attention. Your health care provider may recommend: Stimulant medicines. These are the most common medicines used for adult ADHD. They affect certain chemicals in the brain (neurotransmitters) and improve your ability to control your symptoms. A non-stimulant medicine. These medicines can also improve focus, attention, and impulsive behavior. It may take weeks to months to see the effects of this medicine. Counseling and behavioral management are also important for treating ADHD. Counseling is often used along with medicine. Your health care provider may suggest: Cognitive behavioral therapy (CBT). This type of therapy teaches you to replace negative thoughts and actions with positive thoughts and actions. When used as part of ADHD treatment, this therapy may also include: Coping strategies for organization, time management, impulse control, and stress reduction. Mindfulness and meditation training. Behavioral management. You may work with a coach who is specially trained to help people with ADHD manage and organize activities and function more effectively. Follow these instructions at home: Medicines  Take over-the-counter and prescription medicines only as told by your health care provider. Talk with your health care provider  about the possible side effects of your medicines and   how to manage them. Alcohol use Do not drink alcohol if: Your health care provider tells you not to drink. You are pregnant, may be pregnant, or are planning to become pregnant. If you drink alcohol: Limit how much you use to: 0-1 drink a day for women. 0-2 drinks a day for men. Know how much alcohol is in your drink. In the [place] one drink equals one 12 oz bottle of beer (355 mL), one 5 oz glass of wine (148 mL), or one 1 oz glass of hard liquor (44 mL). Lifestyle  Do not use illegal drugs. Get enough sleep. Eat a healthy diet. Exercise regularly. Exercise can help to reduce stress and anxiety. General instructions Learn as much as you can about adult ADHD, and work closely with your health care providers to find the treatments that work best for you. Follow the same schedule each day. Use reminder devices like notes, calendars, and phone apps to stay on time and organized. Keep all follow-up visits. Your health care provider will need to monitor your condition and adjust your treatment over time. Where to find more information A health care provider may be able to recommend resources that are available online or over the phone. You could start with: Attention Deficit Disorder Association (ADDA): add.org National Institute of Mental Health (NIMH): nimh.nih.gov Contact a health care provider if: Your symptoms continue to cause problems. You have side effects from your medicine, such as: Repeated muscle twitches, coughing, or speech outbursts. Sleep problems. Loss of appetite. Dizziness. Unusually fast heartbeat. Stomach pains. Headaches. You are struggling with anxiety, depression, or substance abuse. Get help right away if: You have a severe reaction to a medicine. This symptom may be an emergency. Get help right away. Call 911. Do not wait to see if the symptom will go away. Do not drive yourself to the hospital. Take  one of these steps if you feel like you may hurt yourself or others, or have thoughts about taking your own life: Go to your nearest emergency room. Call 911. Call the National Suicide Prevention Lifeline at 1-800-273-8255 or 988. This is open 24 hours a day Text the Crisis Text Line at 741741. Summary ADHD is a mental health disorder that starts during childhood and often continues into your adult years. The exact cause of ADHD is not known. Most experts believe genetics and environmental factors contribute to ADHD. There is no cure for ADHD, but treatment with medicine, cognitive behavioral therapy, or behavioral management can help you manage your condition. This information is not intended to replace advice given to you by your health care provider. Make sure you discuss any questions you have with your health care provider. Document Revised: 07/14/2021 Document Reviewed: 07/14/2021 Elsevier Patient Education  2023 Elsevier Inc.  

## 2022-08-21 ENCOUNTER — Telehealth (INDEPENDENT_AMBULATORY_CARE_PROVIDER_SITE_OTHER): Payer: BC Managed Care – PPO | Admitting: Nurse Practitioner

## 2022-08-21 ENCOUNTER — Encounter: Payer: Self-pay | Admitting: Nurse Practitioner

## 2022-08-21 VITALS — Wt 190.0 lb

## 2022-08-21 DIAGNOSIS — E6609 Other obesity due to excess calories: Secondary | ICD-10-CM | POA: Diagnosis not present

## 2022-08-21 DIAGNOSIS — E282 Polycystic ovarian syndrome: Secondary | ICD-10-CM

## 2022-08-21 DIAGNOSIS — F902 Attention-deficit hyperactivity disorder, combined type: Secondary | ICD-10-CM

## 2022-08-21 DIAGNOSIS — F418 Other specified anxiety disorders: Secondary | ICD-10-CM

## 2022-08-21 DIAGNOSIS — Z6834 Body mass index (BMI) 34.0-34.9, adult: Secondary | ICD-10-CM

## 2022-08-21 MED ORDER — VENLAFAXINE HCL ER 75 MG PO CP24: 75.0000 mg | ORAL_CAPSULE | Freq: Every day | ORAL | 4 refills | Status: DC

## 2022-08-21 NOTE — Assessment & Plan Note (Signed)
Chronic, ongoing, taking Spironolactone. Was taking Wegovy, but insurance stopped coverage of this, it was very beneficial to her PCOS and now noticing more symptoms.  Could consider return to Metformin in future if needed.  She plans on follow-up with GYN to check all levels as having some perimenopausal symptoms starting.

## 2022-08-21 NOTE — Assessment & Plan Note (Signed)
Chronic, exacerbated by work stress and life stressors.  Denies SI/HI.  Increase Effexor to XL 75 MG daily dosing and adjust further as needed.  Recommend meditation and relaxation exercises at home.  Plan on return in 4 weeks.

## 2022-08-21 NOTE — Assessment & Plan Note (Signed)
Ongoing, starting in childhood.  Will continue Adderall XR 30 MG daily, to assist her with school and working full-time.  In past did not tolerate Ritalin or Concerta per her report, will avoid these.  UDS due next 02/20/23 and contract up to date.  She is aware of need for every 3 month visits and yearly UDS, agrees with this plan.  Predated refills sent for the next 3 months. 

## 2022-08-21 NOTE — Assessment & Plan Note (Signed)
BMI 34.74, trending up, had lost 74 pounds with Wegovy 2.4 MG.  This offered benefit to PCOS and overall health.  Insurance no longer covering [employer]. We tried Topamax with no benefit to weight loss and side effects presented.  Recommended eating smaller high protein, low fat meals more frequently and exercising 30 mins a day 5 times a week with a goal of 10-15lb weight loss in the next 3 months. Patient voiced their understanding and motivation to adhere to these recommendations.

## 2022-08-21 NOTE — Progress Notes (Signed)
Wt 190 lb (86.2 kg)   LMP 08/13/2022   BMI 34.74 kg/m    Subjective:    Patient ID: [name], female    DOB: May 20, 1985, 37 y.o.   MRN: [id number]  HPI: [name] is a 37 y.o. female  Chief Complaint  Patient presents with   ADHD   Weight Loss    This visit was completed via video visit through MyChart due to the restrictions of the COVID-19 pandemic. All issues as above were discussed and addressed. Physical exam was done as above through visual confirmation on video through MyChart. If it was felt that the patient should be evaluated in the office, they were directed there. The patient verbally consented to this visit. Location of the patient: home Location of the provider: work Those involved with this call:  Provider: Aura Dials, DNP CMA: Tristan Schroeder, CMA Front Desk/Registration: [employer]  Time spent on call:  21 minutes with patient face to face via video conference. More than 50% of this time was spent in counseling and coordination of care. 15 minutes total spent in review of patient's record and preparation of their chart.  I verified patient identity using two factors (patient name and date of birth). Patient consents verbally to being seen via telemedicine visit today.    ADHD FOLLOW UP + PCOS (WEIGHT LOSS) Continues on Adderall XR 30 MG daily, which offers her benefit -- mood and focus at work and school -- completed school and is studying for state exam. PDMP last fill was 08/08/22.    Was taking Wegovy 2.4 MG -- started February 2023 (with Ozempic).  Had lost 74 pounds with medication , but they stopped covering -- this was not only beneficial for weight but drastically helped her PCOS symptoms as well.  We changed to Topamax, but was not losing weight and started having side effects, like tingling in right hand and bad taste in mouth.  Working on exercise and diet changes.  Has PCOS.  Se[facility] GYN at [facility] and plans on seeing them  upcoming for blood level checks as is noticing hot flashes, night sweats, and mood changes.   ADHD status: controlled Satisfied with current therapy: yes Medication compliance:  good compliance Controlled substance contract: yes Previous psychiatry evaluation: no Previous medications: yes adderall and adderall XR   Taking meds on weekends/vacations: yes Work/school performance:  good Difficulty sustaining attention/completing tasks: no Distracted by extraneous stimuli: no Does not listen when spoken to: no  Fidgets with hands or feet: no Unable to stay in seat: no Blurts out/interrupts others: no ADHD Medication Side Effects: no    Decreased appetite: no    Headache: no    Sleeping disturbance pattern: no    Irritability: no    Rebound effects (worse than baseline) off medication: no    Anxiousness: no    Dizziness: no    Tics: no   ANXIETY/STRESS She is taking Effexor 37.5 MG for anxiety, is tolerating this well.   Duration: stable Anxious mood: yes Excessive worrying: yes Irritability:yes Sweating: no Nausea: no Palpitations:no Hyperventilation: no Panic attacks: no Agoraphobia: no  Obscessions/compulsions: no Depressed mood: yes    08/21/2022    1:04 PM 02/19/2022    3:42 PM 12/05/2021    8:33 AM 08/15/2021   11:21 AM 05/18/2021   11:03 AM  Depression screen PHQ 2/9  Decreased Interest 0 1 2 1  0  Down, Depressed, Hopeless 0 1 2 1  0  PHQ -  2 Score 0 2 4 2  0  Altered sleeping 3 2 2 2 2   Tired, decreased energy 3 1 2 1 1   Change in appetite 0 1 2 1 1   Feeling bad or failure about yourself  0 1 3 0 1  Trouble concentrating 3 1 2 1 1   Moving slowly or fidgety/restless 0 0 1 0 0  Suicidal thoughts 0 0 0 0 0  PHQ-9 Score 9 8 16 7 6   Difficult doing work/chores Somewhat difficult  Somewhat difficult    Anhedonia: no Weight changes: no Insomnia: yes hard to fall asleep -- 5 to 6 hours a night Hypersomnia: no Fatigue/loss of energy: no Feelings of worthlessness:  no Feelings of guilt: no Impaired concentration/indecisiveness: no Suicidal ideations: no  Crying spells: no Recent Stressors/Life Changes: yes   Relationship problems: no   Family stress: yes     Financial stress: yes    Job stress: no    Recent death/loss: no     Aug 28, 2022    1:06 PM 02/19/2022    3:42 PM 12/05/2021    8:34 AM 08/15/2021   11:22 AM  GAD 7 : Generalized Anxiety Score  Nervous, Anxious, on Edge 1 1 3 1   Control/stop worrying 1 1 2 1   Worry too much - different things 1 1 2 2   Trouble relaxing 3 2 3 2   Restless 1 1 1  0  Easily annoyed or irritable 3 2 3 2   Afraid - awful might happen 0 0 1 1  Total GAD 7 Score 10 8 15 9   Anxiety Difficulty Somewhat difficult Somewhat difficult Somewhat difficult Somewhat difficult     Relevant past medical, surgical, family and social history reviewed and updated as indicated. Interim medical history since our last visit reviewed. Allergies and medications reviewed and updated.  Review of Systems  Constitutional:  Negative for activity change, appetite change, diaphoresis, fatigue and fever.  Respiratory:  Negative for cough, chest tightness and shortness of breath.   Cardiovascular:  Negative for chest pain, palpitations and leg swelling.  Gastrointestinal: Negative.   Neurological: Negative.   Psychiatric/Behavioral:  Positive for sleep disturbance. Negative for decreased concentration, self-injury and suicidal ideas. The patient is nervous/anxious.    Per HPI unless specifically indicated above     Objective:    Wt 190 lb (86.2 kg)   LMP 08/13/2022   BMI 34.74 kg/m   Wt Readings from Last 3 Encounters:  Aug 28, 2022 190 lb (86.2 kg)  05/21/22 178 lb (80.7 kg)  02/19/22 168 lb 9.6 oz (76.5 kg)    Physical Exam Vitals and nursing note reviewed.  Constitutional:      General: She is awake. She is not in acute distress.    Appearance: She is well-developed and well-groomed. She is obese. She is not ill-appearing or  toxic-appearing.  HENT:     Head: Normocephalic.     Right Ear: Hearing normal.     Left Ear: Hearing normal.     Nose: Nose normal.     Mouth/Throat:     Mouth: Mucous membranes are moist.  Eyes:     General: Lids are normal.        Right eye: No discharge.        Left eye: No discharge.     Conjunctiva/sclera: Conjunctivae normal.     Pupils: Pupils are equal, round, and reactive to light.  Neck:     Thyroid: No thyromegaly.     Vascular: No carotid bruit or  JVD.  Cardiovascular:     Rate and Rhythm: Normal rate and regular rhythm.     Heart sounds: Normal heart sounds. No murmur heard.    No gallop.  Pulmonary:     Effort: Pulmonary effort is normal.     Breath sounds: Normal breath sounds.  Abdominal:     General: Bowel sounds are normal.     Palpations: Abdomen is soft.  Musculoskeletal:     Cervical back: Normal range of motion and neck supple.     Right lower leg: No edema.     Left lower leg: No edema.  Lymphadenopathy:     Cervical: No cervical adenopathy.  Skin:    General: Skin is warm and dry.  Neurological:     Mental Status: She is alert and oriented to person, place, and time.  Psychiatric:        Attention and Perception: Attention normal.        Mood and Affect: Mood normal.        Behavior: Behavior normal. Behavior is cooperative.        Thought Content: Thought content normal.        Judgment: Judgment normal.    Results for orders placed or performed in visit on 02/19/22  [id number] 11+Oxyco+Alc+Crt-Bund  Result Value Ref Range   Ethanol Negative Cutoff=0.020 %   Amphetamines, Urine See Final Results Cutoff=1000 ng/mL   Barbiturate Negative Cutoff=200 ng/mL   BENZODIAZ UR QL Negative Cutoff=200 ng/mL   Cannabinoid Quant, Ur Negative Cutoff=50 ng/mL   Cocaine (Metabolite) Negative Cutoff=300 ng/mL   OPIATE SCREEN URINE Negative Cutoff=300 ng/mL   Oxycodone/Oxymorphone, Urine Negative Cutoff=300 ng/mL   Phencyclidine Negative Cutoff=25 ng/mL    Methadone Screen, Urine Negative Cutoff=300 ng/mL   Propoxyphene Negative Cutoff=300 ng/mL   Meperidine Negative Cutoff=200 ng/mL   Tramadol Negative Cutoff=200 ng/mL   Creatinine 64.3 20.0 - 300.0 mg/dL   pH, Urine 6.5 4.5 - 8.9  Drug Profile [phone]  Result Value Ref Range   Amphetamines Positive (A) Cutoff=1000   Amphetamine Positive (A)    Amphetamine GC/MS Conf >3000 Cutoff=500 ng/mL   Methamphetamine Negative Cutoff=500      Assessment & Plan:   Problem List Items Addressed This Visit       Endocrine   PCOS (polycystic ovarian syndrome)    Chronic, ongoing, taking Spironolactone. Was taking Wegovy, but insurance stopped coverage of this, it was very beneficial to her PCOS and now noticing more symptoms.  Could consider return to Metformin in future if needed.  She plans on follow-up with GYN to check all levels as having some perimenopausal symptoms starting.        Other   ADHD (attention deficit hyperactivity disorder)    Ongoing, starting in childhood.  Will continue Adderall XR 30 MG daily, to assist her with school and working full-time.  In past did not tolerate Ritalin or Concerta per her report, will avoid these.  UDS due next 02/20/23 and contract up to date.  She is aware of need for every 3 month visits and yearly UDS, agrees with this plan.  Predated refills sent for the next 3 months.      Obesity    BMI 34.74, trending up, had lost 74 pounds with Wegovy 2.4 MG.  This offered benefit to PCOS and overall health.  Insurance no longer covering [employer]. We tried Topamax with no benefit to weight loss and side effects presented.  Recommended eating smaller high protein, low fat meals more  frequently and exercising 30 mins a day 5 times a week with a goal of 10-15lb weight loss in the next 3 months. Patient voiced their understanding and motivation to adhere to these recommendations.         Situational anxiety - Primary    Chronic, exacerbated by work stress and life  stressors.  Denies SI/HI.  Increase Effexor to XL 75 MG daily dosing and adjust further as needed.  Recommend meditation and relaxation exercises at home.  Plan on return in 4 weeks.      Relevant Medications   venlafaxine XR (EFFEXOR XR) 75 MG 24 hr capsule    I discussed the assessment and treatment plan with the patient. The patient was provided an opportunity to ask questions and all were answered. The patient agreed with the plan and demonstrated an understanding of the instructions.   The patient was advised to call back or seek an in-person evaluation if the symptoms worsen or if the condition fails to improve as anticipated.   I provided 21+ minutes of time during this encounter.    Follow up plan: Return in about 4 weeks (around 09/18/2022) for ANXIETY/DEPRESSION -- increased Effexor to 75 MG.

## 2022-08-21 NOTE — Progress Notes (Signed)
Called to schedule appt  no answer left message for pt to call the off and schedule appt

## 2022-09-14 ENCOUNTER — Other Ambulatory Visit: Payer: Self-pay | Admitting: Nurse Practitioner

## 2022-09-14 ENCOUNTER — Encounter: Payer: Self-pay | Admitting: Nurse Practitioner

## 2022-09-14 NOTE — Telephone Encounter (Signed)
Requested medication (s) are due for refill today: yes  Requested medication (s) are on the active medication list: yes  Last refill:  05/21/22  Future visit scheduled: no  Notes to clinic:  Unable to refill per protocol, cannot delegate.      Requested Prescriptions  Pending Prescriptions Disp Refills   amphetamine-dextroamphetamine (ADDERALL XR) 30 MG 24 hr capsule [Pharmacy Med Name: AMPHETAMINE-DEXTROAMPHET ER 30 MG C] 30 capsule     Sig: TAKE 1 CAPSULE BY MOUTH EVERY MORNING     Not Delegated - Psychiatry:  Stimulants/ADHD Failed - 09/14/2022  8:46 AM      Failed - This refill cannot be delegated      Passed - Urine Drug Screen completed in last 360 days      Passed - Last BP in normal range    BP Readings from Last 1 Encounters:  02/19/22 104/69         Passed - Last Heart Rate in normal range    Pulse Readings from Last 1 Encounters:  02/19/22 91         Passed - Valid encounter within last 6 months    Recent Outpatient Visits           3 weeks ago Situational anxiety   [place] [facility] [place], [place] T, NP   3 months ago Attention deficit hyperactivity disorder (ADHD), combined type   [place] [facility] [place], [place] T, NP   6 months ago Attention deficit hyperactivity disorder (ADHD), combined type   [place] [facility] [place], [place] T, NP   9 months ago Hordeolum externum of left lower eyelid   [place] [facility] [place], [place] T, NP   9 months ago Class 2 obesity due to excess calories without serious comorbidity with body mass index (BMI) of 37.0 to 37.9 in adult   [facility] [place], Dorie Rank, NP

## 2022-09-21 DIAGNOSIS — N939 Abnormal uterine and vaginal bleeding, unspecified: Secondary | ICD-10-CM | POA: Diagnosis not present

## 2022-09-21 DIAGNOSIS — Z7689 Persons encountering health services in other specified circumstances: Secondary | ICD-10-CM | POA: Diagnosis not present

## 2022-09-21 DIAGNOSIS — E282 Polycystic ovarian syndrome: Secondary | ICD-10-CM | POA: Diagnosis not present

## 2022-10-08 ENCOUNTER — Other Ambulatory Visit: Payer: Self-pay

## 2022-10-08 MED ORDER — PHENTERMINE HCL 37.5 MG PO TABS
37.5000 mg | ORAL_TABLET | Freq: Every morning | ORAL | 3 refills | Status: DC
  Filled 2022-10-08: qty 30, 30d supply, fill #0

## 2022-10-09 ENCOUNTER — Other Ambulatory Visit: Payer: Self-pay

## 2022-10-19 ENCOUNTER — Other Ambulatory Visit: Payer: Self-pay | Admitting: Nurse Practitioner

## 2022-10-19 NOTE — Telephone Encounter (Signed)
Requested medication (s) are due for refill today: yes  Requested medication (s) are on the active medication list: yes  Last refill:  09/16/22 #30  Future visit scheduled: no  Notes to clinic:  med not delegated to NT to RF    Requested Prescriptions  Pending Prescriptions Disp Refills   amphetamine-dextroamphetamine (ADDERALL XR) 30 MG 24 hr capsule [Pharmacy Med Name: AMPHETAMINE-DEXTROAMPHET ER 30 MG C] 30 capsule     Sig: TAKE 1 CAPSULE BY MOUTH EVERY MORNING     Not Delegated - Psychiatry:  Stimulants/ADHD Failed - 10/19/2022  8:53 AM      Failed - This refill cannot be delegated      Passed - Urine Drug Screen completed in last 360 days      Passed - Last BP in normal range    BP Readings from Last 1 Encounters:  02/19/22 104/69         Passed - Last Heart Rate in normal range    Pulse Readings from Last 1 Encounters:  02/19/22 91         Passed - Valid encounter within last 6 months    Recent Outpatient Visits           1 month ago Situational anxiety   [place] [facility] [place], [place] T, NP   5 months ago Attention deficit hyperactivity disorder (ADHD), combined type   [place] [facility] [place], [place] T, NP   8 months ago Attention deficit hyperactivity disorder (ADHD), combined type   [place] [facility] [place], [place] T, NP   10 months ago Hordeolum externum of left lower eyelid   [place] [facility] [place], [place] T, NP   10 months ago Class 2 obesity due to excess calories without serious comorbidity with body mass index (BMI) of 37.0 to 37.9 in adult   [place] [place], Dorie Rank, NP

## 2022-11-12 DIAGNOSIS — Z01419 Encounter for gynecological examination (general) (routine) without abnormal findings: Secondary | ICD-10-CM | POA: Diagnosis not present

## 2022-11-19 ENCOUNTER — Other Ambulatory Visit: Payer: Self-pay | Admitting: Nurse Practitioner

## 2022-11-20 NOTE — Telephone Encounter (Signed)
Requested medications are due for refill today.  yes  Requested medications are on the active medications list.  yes  Last refill. 10/19/2022 #30 0 rf  Future visit scheduled.   no  Notes to clinic.  Refill not delegated    Requested Prescriptions  Pending Prescriptions Disp Refills   amphetamine-dextroamphetamine (ADDERALL XR) 30 MG 24 hr capsule [Pharmacy Med Name: AMPHETAMINE-DEXTROAMPHET ER 30 MG C] 30 capsule     Sig: TAKE 1 CAPSULE BY MOUTH EVERY MORNING     Not Delegated - Psychiatry:  Stimulants/ADHD Failed - 11/19/2022  9:03 AM      Failed - This refill cannot be delegated      Passed - Urine Drug Screen completed in last 360 days      Passed - Last BP in normal range    BP Readings from Last 1 Encounters:  02/19/22 104/69         Passed - Last Heart Rate in normal range    Pulse Readings from Last 1 Encounters:  02/19/22 91         Passed - Valid encounter within last 6 months    Recent Outpatient Visits           3 months ago Situational anxiety   [place] [facility] [place], [place] T, NP   6 months ago Attention deficit hyperactivity disorder (ADHD), combined type   [facility] [facility] [place], [place] T, NP   9 months ago Attention deficit hyperactivity disorder (ADHD), combined type   [place] [facility] [place], [place] T, NP   11 months ago Hordeolum externum of left lower eyelid   [place] [facility] [place], [place] T, NP   11 months ago Class 2 obesity due to excess calories without serious comorbidity with body mass index (BMI) of 37.0 to 37.9 in adult   [facility] [place], Dorie Rank, NP

## 2022-12-26 ENCOUNTER — Other Ambulatory Visit: Payer: Self-pay | Admitting: Nurse Practitioner

## 2022-12-26 MED ORDER — AMPHETAMINE-DEXTROAMPHET ER 30 MG PO CP24: 30.0000 mg | ORAL_CAPSULE | Freq: Every morning | ORAL | 0 refills | Status: DC

## 2022-12-27 NOTE — Telephone Encounter (Signed)
RX sent yesterday, please refuse.

## 2022-12-27 NOTE — Telephone Encounter (Signed)
Requested medication (s) are due for refill today- no  Requested medication (s) are on the active medication list -yes  Future visit scheduled -no  Last refill: 12/26/22 #30  Notes to clinic: duplicate request, non delegated Rx  Requested Prescriptions  Pending Prescriptions Disp Refills   amphetamine-dextroamphetamine (ADDERALL XR) 30 MG 24 hr capsule [Pharmacy Med Name: AMPHETAMINE-DEXTROAMPHET ER 30 MG C] 30 capsule     Sig: TAKE 1 CAPSULE BY MOUTH EVERY MORNING     Not Delegated - Psychiatry:  Stimulants/ADHD Failed - 12/26/2022  8:30 AM      Failed - This refill cannot be delegated      Passed - Urine Drug Screen completed in last 360 days      Passed - Last BP in normal range    BP Readings from Last 1 Encounters:  02/19/22 104/69         Passed - Last Heart Rate in normal range    Pulse Readings from Last 1 Encounters:  02/19/22 91         Passed - Valid encounter within last 6 months    Recent Outpatient Visits           4 months ago Situational anxiety   [facility] [place], [place] T, NP   7 months ago Attention deficit hyperactivity disorder (ADHD), combined type   [place] [facility] [place], [name] T, NP   10 months ago Attention deficit hyperactivity disorder (ADHD), combined type   [facility] [place], [place] T, NP   1 year ago Hordeolum externum of left lower eyelid   [place] [facility] [place], [place] T, NP   1 year ago Class 2 obesity due to excess calories without serious comorbidity with body mass index (BMI) of 37.0 to 37.9 in adult   [place] [facility] [place], [name] T, NP                 Requested Prescriptions  Pending Prescriptions Disp Refills   amphetamine-dextroamphetamine (ADDERALL XR) 30 MG 24 hr capsule [Pharmacy Med Name: AMPHETAMINE-DEXTROAMPHET ER 30 MG C] 30 capsule     Sig: TAKE 1 CAPSULE BY MOUTH EVERY MORNING     Not  Delegated - Psychiatry:  Stimulants/ADHD Failed - 12/26/2022  8:30 AM      Failed - This refill cannot be delegated      Passed - Urine Drug Screen completed in last 360 days      Passed - Last BP in normal range    BP Readings from Last 1 Encounters:  02/19/22 104/69         Passed - Last Heart Rate in normal range    Pulse Readings from Last 1 Encounters:  02/19/22 91         Passed - Valid encounter within last 6 months    Recent Outpatient Visits           4 months ago Situational anxiety   [place] [place], [place] T, NP   7 months ago Attention deficit hyperactivity disorder (ADHD), combined type   [facility] [place], [name] T, NP   10 months ago Attention deficit hyperactivity disorder (ADHD), combined type   [place] [facility] [place], [name] T, NP   1 year ago Hordeolum externum of left lower eyelid    [facility] [place], [name] T, NP   1 year ago Class  2 obesity due to excess calories without serious comorbidity with body mass index (BMI) of 37.0 to 37.9 in adult   [facility] [place], Dorie Rank, NP

## 2022-12-31 ENCOUNTER — Other Ambulatory Visit: Payer: Self-pay | Admitting: Nurse Practitioner

## 2023-01-01 NOTE — Telephone Encounter (Signed)
Requested medication (s) are due for refill today: Yes  Requested medication (s) are on the active medication list: Yes  Last refill:  12/07/21  Future visit scheduled:   Notes to clinic:  Protocol indicates labs needed.     Requested Prescriptions  Pending Prescriptions Disp Refills   spironolactone (ALDACTONE) 100 MG tablet [Pharmacy Med Name: SPIRONOLACTONE 100 MG TAB] 180 tablet 4    Sig: TAKE ONE TABLET BY MOUTH TWICE DAILY     Cardiovascular: Diuretics - Aldosterone Antagonist Failed - 12/31/2022 12:28 PM      Failed - Cr in normal range and within 180 days    Creatinine  Date Value Ref Range Status  02/19/2022 64.3 20.0 - 300.0 mg/dL Final   Creatinine, Ser  Date Value Ref Range Status  02/08/2022 0.92 0.57 - 1.00 mg/dL Final   Creatinine,U  Date Value Ref Range Status  05/07/2008 232.3 mg/dL Final    Comment:    See lab report for associated comment(s)         Failed - K in normal range and within 180 days    Potassium  Date Value Ref Range Status  02/08/2022 4.3 3.5 - 5.2 mmol/L Final         Failed - Na in normal range and within 180 days    Sodium  Date Value Ref Range Status  02/08/2022 140 134 - 144 mmol/L Final         Failed - eGFR is 30 or above and within 180 days    GFR calc Af Amer  Date Value Ref Range Status  09/01/2018 >60 >60 mL/min Final   GFR calc non Af Amer  Date Value Ref Range Status  09/01/2018 >60 >60 mL/min Final   eGFR  Date Value Ref Range Status  02/08/2022 82 >59 mL/min/1.73 Final         Passed - Last BP in normal range    BP Readings from Last 1 Encounters:  02/19/22 104/69         Passed - Valid encounter within last 6 months    Recent Outpatient Visits           4 months ago Situational anxiety   [place] [facility] [place], [name] T, NP   7 months ago Attention deficit hyperactivity disorder (ADHD), combined type   [facility] [place], [name] T, NP   10 months ago  Attention deficit hyperactivity disorder (ADHD), combined type   [place] [place], [name] T, NP   1 year ago Hordeolum externum of left lower eyelid   [facility] [place], [place] T, NP   1 year ago Class 2 obesity due to excess calories without serious comorbidity with body mass index (BMI) of 37.0 to 37.9 in adult   [facility] [place], Dorie Rank, NP

## 2023-01-24 DIAGNOSIS — Z23 Encounter for immunization: Secondary | ICD-10-CM | POA: Diagnosis not present

## 2023-01-26 ENCOUNTER — Other Ambulatory Visit: Payer: Self-pay | Admitting: Nurse Practitioner

## 2023-01-28 NOTE — Telephone Encounter (Signed)
Request is too soon for refill. Last refill 01/01/23 for 90 and 1 refill.  Requested Prescriptions  Pending Prescriptions Disp Refills   spironolactone (ALDACTONE) 100 MG tablet [Pharmacy Med Name: SPIRONOLACTONE 100 MG TABLET] 180 tablet 4    Sig: TAKE 1 TABLET BY MOUTH TWICE A DAY     Cardiovascular: Diuretics - Aldosterone Antagonist Failed - 01/26/2023  9:27 AM      Failed - Cr in normal range and within 180 days    Creatinine  Date Value Ref Range Status  02/19/2022 64.3 20.0 - 300.0 mg/dL Final   Creatinine, Ser  Date Value Ref Range Status  02/08/2022 0.92 0.57 - 1.00 mg/dL Final   Creatinine,U  Date Value Ref Range Status  05/07/2008 232.3 mg/dL Final    Comment:    See lab report for associated comment(s)         Failed - K in normal range and within 180 days    Potassium  Date Value Ref Range Status  02/08/2022 4.3 3.5 - 5.2 mmol/L Final         Failed - Na in normal range and within 180 days    Sodium  Date Value Ref Range Status  02/08/2022 140 134 - 144 mmol/L Final         Failed - eGFR is 30 or above and within 180 days    GFR calc Af Amer  Date Value Ref Range Status  09/01/2018 >60 >60 mL/min Final   GFR calc non Af Amer  Date Value Ref Range Status  09/01/2018 >60 >60 mL/min Final   eGFR  Date Value Ref Range Status  02/08/2022 82 >59 mL/min/1.73 Final         Passed - Last BP in normal range    BP Readings from Last 1 Encounters:  02/19/22 104/69         Passed - Valid encounter within last 6 months    Recent Outpatient Visits           5 months ago Situational anxiety   [facility] [place], [name] T, NP   8 months ago Attention deficit hyperactivity disorder (ADHD), combined type   [place] [facility] [place], [name] T, NP   11 months ago Attention deficit hyperactivity disorder (ADHD), combined type   [place] [facility] [place], [name] T, NP   1 year ago Hordeolum externum  of left lower eyelid   [facility] [place], [place] T, NP   1 year ago Class 2 obesity due to excess calories without serious comorbidity with body mass index (BMI) of 37.0 to 37.9 in adult   [facility] [place], Dorie Rank, NP

## 2023-02-06 ENCOUNTER — Other Ambulatory Visit: Payer: Self-pay | Admitting: Nurse Practitioner

## 2023-02-06 NOTE — Telephone Encounter (Signed)
Requested medication (s) are due for refill today: Yes  Requested medication (s) are on the active medication list: Yes  Last refill:  12/26/22  Future visit scheduled: No  Notes to clinic:  Not delegated.    Requested Prescriptions  Pending Prescriptions Disp Refills   amphetamine-dextroamphetamine (ADDERALL XR) 30 MG 24 hr capsule [Pharmacy Med Name: AMPHETAMINE-DEXTROAMPHET ER 30 MG C] 30 capsule     Sig: TAKE 1 CAPSULE BY MOUTH EVERY MORNING.     Not Delegated - Psychiatry:  Stimulants/ADHD Failed - 02/06/2023  9:27 AM      Failed - This refill cannot be delegated      Passed - Urine Drug Screen completed in last 360 days      Passed - Last BP in normal range    BP Readings from Last 1 Encounters:  02/19/22 104/69         Passed - Last Heart Rate in normal range    Pulse Readings from Last 1 Encounters:  02/19/22 91         Passed - Valid encounter within last 6 months    Recent Outpatient Visits           5 months ago Situational anxiety   [place] [facility] [place], [place] T, NP   8 months ago Attention deficit hyperactivity disorder (ADHD), combined type   [place] [facility] [place], [name] T, NP   11 months ago Attention deficit hyperactivity disorder (ADHD), combined type   [facility] [place], [place] T, NP   1 year ago Hordeolum externum of left lower eyelid   [place] [place], [place] T, NP   1 year ago Class 2 obesity due to excess calories without serious comorbidity with body mass index (BMI) of 37.0 to 37.9 in adult   [facility] [place], Dorie Rank, NP

## 2023-02-08 DIAGNOSIS — B301 Conjunctivitis due to adenovirus: Secondary | ICD-10-CM | POA: Diagnosis not present

## 2023-03-04 ENCOUNTER — Other Ambulatory Visit: Payer: Self-pay | Admitting: Nurse Practitioner

## 2023-03-05 ENCOUNTER — Encounter: Payer: Self-pay | Admitting: Nurse Practitioner

## 2023-03-05 MED ORDER — AMPHETAMINE-DEXTROAMPHET ER 15 MG PO CP24
30.0000 mg | ORAL_CAPSULE | ORAL | 0 refills | Status: DC
Start: 2023-03-05 — End: 2023-06-18

## 2023-03-05 NOTE — Telephone Encounter (Signed)
Requested medication (s) are due for refill today -unsure  Requested medication (s) are on the active medication list -yes  Future visit scheduled -no  Last refill: 02/07/23 #30  Notes to clinic: non delegated Rx- refill request has notes  Requested Prescriptions  Pending Prescriptions Disp Refills   amphetamine-dextroamphetamine (ADDERALL XR) 30 MG 24 hr capsule [Pharmacy Med Name: AMPHETAMINE-DEXTROAMPHET ER 30 MG C] 30 capsule     Sig: TAKE 1 CAPSULE BY MOUTH EVERY MORNING     Not Delegated - Psychiatry:  Stimulants/ADHD Failed - 03/04/2023 11:45 AM      Failed - This refill cannot be delegated      Failed - Urine Drug Screen completed in last 360 days      Failed - Valid encounter within last 6 months    Recent Outpatient Visits           6 months ago Situational anxiety   [place] [facility] [place], [name] T, NP   9 months ago Attention deficit hyperactivity disorder (ADHD), combined type   [place] [facility] [place], [name] T, NP   1 year ago Attention deficit hyperactivity disorder (ADHD), combined type   [place] [facility] [place], [name] T, NP   1 year ago Hordeolum externum of left lower eyelid   [place] [facility] [place], [place] T, NP   1 year ago Class 2 obesity due to excess calories without serious comorbidity with body mass index (BMI) of 37.0 to 37.9 in adult   [place] [facility] [place], [place] T, NP              Passed - Last BP in normal range    BP Readings from Last 1 Encounters:  02/19/22 104/69         Passed - Last Heart Rate in normal range    Pulse Readings from Last 1 Encounters:  02/19/22 91            Requested Prescriptions  Pending Prescriptions Disp Refills   amphetamine-dextroamphetamine (ADDERALL XR) 30 MG 24 hr capsule [Pharmacy Med Name: AMPHETAMINE-DEXTROAMPHET ER 30 MG C] 30 capsule     Sig: TAKE 1 CAPSULE BY MOUTH EVERY MORNING      Not Delegated - Psychiatry:  Stimulants/ADHD Failed - 03/04/2023 11:45 AM      Failed - This refill cannot be delegated      Failed - Urine Drug Screen completed in last 360 days      Failed - Valid encounter within last 6 months    Recent Outpatient Visits           6 months ago Situational anxiety   [place] [facility] [place], [name] T, NP   9 months ago Attention deficit hyperactivity disorder (ADHD), combined type   [place] [facility] [place], [name] T, NP   1 year ago Attention deficit hyperactivity disorder (ADHD), combined type   [place] [facility] [place], [place] T, NP   1 year ago Hordeolum externum of left lower eyelid   [place] [facility] [place], [place] T, NP   1 year ago Class 2 obesity due to excess calories without serious comorbidity with body mass index (BMI) of 37.0 to 37.9 in adult   [facility] [place], [place] T, NP              Passed - Last BP in normal range    BP  Readings from Last 1 Encounters:  02/19/22 104/69         Passed - Last Heart Rate in normal range    Pulse Readings from Last 1 Encounters:  02/19/22 91

## 2023-05-30 ENCOUNTER — Other Ambulatory Visit: Payer: Self-pay | Admitting: Nurse Practitioner

## 2023-05-30 MED ORDER — AMPHETAMINE-DEXTROAMPHET ER 30 MG PO CP24: 30.0000 mg | ORAL_CAPSULE | Freq: Every morning | ORAL | 0 refills | Status: DC

## 2023-05-30 NOTE — Telephone Encounter (Signed)
 Last OV 08/21/22. Please advise

## 2023-05-30 NOTE — Telephone Encounter (Signed)
 Requested medication (s) are due for refill today: yes  Requested medication (s) are on the active medication list: yes  Last refill:  03/05/23  Future visit scheduled: no  Notes to clinic:  med not delegated to NT    Requested Prescriptions  Pending Prescriptions Disp Refills   amphetamine-dextroamphetamine (ADDERALL XR) 30 MG 24 hr capsule [Pharmacy Med Name: AMPHETAMINE-DEXTROAMPHET ER 30 MG C] 30 capsule     Sig: TAKE 1 CAPSULE BY MOUTH EVERY MORNING     Not Delegated - Psychiatry:  Stimulants/ADHD Failed - 05/30/2023  3:10 PM      Failed - This refill cannot be delegated      Failed - Urine Drug Screen completed in last 360 days      Failed - Valid encounter within last 6 months    Recent Outpatient Visits           9 months ago Situational anxiety   [place] [facility] [place], [name] T, NP   1 year ago Attention deficit hyperactivity disorder (ADHD), combined type   [place] [facility] [place], [name] T, NP   1 year ago Attention deficit hyperactivity disorder (ADHD), combined type   [facility] [place], [place] T, NP   1 year ago Hordeolum externum of left lower eyelid   [place] [facility] [place], [place] T, NP   1 year ago Class 2 obesity due to excess calories without serious comorbidity with body mass index (BMI) of 37.0 to 37.9 in adult   [facility] [place], [place] T, NP              Passed - Last BP in normal range    BP Readings from Last 1 Encounters:  02/19/22 104/69         Passed - Last Heart Rate in normal range    Pulse Readings from Last 1 Encounters:  02/19/22 91

## 2023-06-05 DIAGNOSIS — H01006 Unspecified blepharitis left eye, unspecified eyelid: Secondary | ICD-10-CM | POA: Diagnosis not present

## 2023-06-16 LAB — CYTOLOGY - PAP
CYTOLOGY - PAP: NEGATIVE
HPV Aptima: NEGATIVE

## 2023-06-16 NOTE — Patient Instructions (Signed)
 Living With Attention Deficit Hyperactivity Disorder If you have been diagnosed with attention deficit hyperactivity disorder (ADHD), you may be relieved that you now know why you have felt or behaved a certain way. Still, you may feel overwhelmed about the treatment ahead. You may also wonder how to get the support you need and how to deal with the condition day-to-day. With treatment and support, you can live with ADHD and manage your symptoms. How to manage lifestyle changes Managing lifestyle changes can be challenging. Seeking support from your healthcare provider, therapist, family, and friends can be helpful. How to recognize changes in your condition The following signs may mean that your treatment is working well and your condition is improving: Consistently being on time for appointments. Being more organized at home and work. Other people noticing improvements in your behavior. Achieving goals that you set for yourself. Thinking more clearly. The following signs may mean that your treatment is not working very well: Feeling impatience or more confusion. Missing, forgetting, or being late for appointments. An increasing sense of disorganization and messiness. More difficulty in reaching goals that you set for yourself. Loved ones becoming angry or frustrated with you. Follow these instructions at home: Medicines Take over-the-counter and prescription medicines only as told by your health care provider. Check with your health care provider before taking any new medicines. General instructions Create structure and an organized atmosphere at home. For example: Make a list of tasks, then rank them from most important to least important. Work on one task at a time until your listed tasks are done. Make a daily schedule and follow it consistently every day. Use an appointment calendar, and check it 2-3 times a day to keep on track. Keep it with you when you leave the house. Create  spaces where you keep certain things, and always put things back in their places after you use them. Keep all follow-up visits. Your health care provider will need to monitor your condition and adjust your treatment over time. Where to find support Talking to others  Keep emotion out of important discussions and speak in a calm, logical way. Listen closely and patiently to your loved ones. Try to understand their point of view, and try to avoid getting defensive. Take responsibility for the consequences of your actions. Ask that others do not take your behaviors personally. Aim to solve problems as they come up, and express your feelings instead of bottling them up. Talk openly about what you need from your loved ones and how they can support you. Consider going to family therapy sessions or having your family meet with a specialist who deals with ADHD-related behavior problems. Finances Not all insurance plans cover mental health care, so it is important to check with your insurance carrier. If paying for co-pays or counseling services is a problem, search for a local or county mental health care center. Public mental health care services may be offered there at a low cost or no cost when you are not able to see a private health care provider. If you are taking medicine for ADHD, you may be able to get the generic form, which may be less expensive than brand-name medicine. Some makers of prescription medicines also offer help to patients who cannot afford the medicines that they need. Therapy and support groups Talking with a mental health care provider and participating in support groups can help to improve your quality of life, daily functioning, and overall symptoms. Questions to ask your health  care provider: What are the risks and benefits of taking medicines? Would I benefit from therapy? How often should I follow up with a health care provider? Where to find more information Learn more  about ADHD from: Children and Adults with Attention Deficit Hyperactivity Disorder: [employer] of Mental Health: BloggerCourse.com Centers for Disease Control and Prevention: TonerPromos.no Contact a health care provider if: You have side effects from your medicines, such as: Repeated muscle twitches, coughing, or speech outbursts. Sleep problems. Loss of appetite. Dizziness. Unusually fast heartbeat. Stomach pains. Headaches. You have new or worsening behavior problems. You are struggling with anxiety, depression, or substance abuse. Get help right away if: You have a severe reaction to a medicine. These symptoms may be an emergency. Get help right away. Call 911. Do not wait to see if the symptoms will go away. Do not drive yourself to the hospital. Take one of these steps if you feel like you may hurt yourself or others, or have thoughts about taking your own life: Go to your nearest emergency room. Call 911. Call the National Suicide Prevention Lifeline at (989) 115-0802 or 988. This is open 24 hours a day. Text the Crisis Text Line at 802-008-3988. Summary With treatment and support, you can live with ADHD and manage your symptoms. Consider taking part in family therapy or self-help groups with family members or friends. When you talk with friends and family about your ADHD, be patient and communicate openly. Keep all follow-up visits. Your health care provider will need to monitor your condition and adjust your treatment over time. This information is not intended to replace advice given to you by your health care provider. Make sure you discuss any questions you have with your health care provider. Document Revised: 07/14/2021 Document Reviewed: 07/14/2021 Elsevier Patient Education  2024 [employer].

## 2023-06-18 ENCOUNTER — Ambulatory Visit: Payer: Self-pay | Admitting: Nurse Practitioner

## 2023-06-18 VITALS — BP 131/83 | HR 82 | Temp 98.4°F | Ht 62.0 in | Wt 239.6 lb

## 2023-06-18 DIAGNOSIS — E282 Polycystic ovarian syndrome: Secondary | ICD-10-CM

## 2023-06-18 DIAGNOSIS — F418 Other specified anxiety disorders: Secondary | ICD-10-CM | POA: Diagnosis not present

## 2023-06-18 DIAGNOSIS — F902 Attention-deficit hyperactivity disorder, combined type: Secondary | ICD-10-CM

## 2023-06-18 DIAGNOSIS — E559 Vitamin D deficiency, unspecified: Secondary | ICD-10-CM | POA: Diagnosis not present

## 2023-06-18 DIAGNOSIS — R7989 Other specified abnormal findings of blood chemistry: Secondary | ICD-10-CM

## 2023-06-18 MED ORDER — AMPHETAMINE-DEXTROAMPHET ER 30 MG PO CP24: 30.0000 mg | ORAL_CAPSULE | Freq: Every morning | ORAL | 0 refills | Status: DC

## 2023-06-18 MED ORDER — VENLAFAXINE HCL ER 75 MG PO CP24: 75.0000 mg | ORAL_CAPSULE | Freq: Every day | ORAL | 4 refills | Status: AC

## 2023-06-18 NOTE — Assessment & Plan Note (Signed)
 Chronic, stable.  Denies SI/HI.  Continue Effexor XR 75 MG daily dosing and adjust further as needed.  Recommend meditation and relaxation exercises at home.

## 2023-06-18 NOTE — Assessment & Plan Note (Signed)
 Ongoing, started in childhood.  Will continue Adderall XR 30 MG daily, to assist her with school and working full-time, does offer benefit.  In past did not tolerate Ritalin or Concerta per her report, will avoid these.  UDS due next 06/07/24 and contract up to date.  She is aware of need for every 3 month visits and yearly UDS, agrees with this plan.  Predated refills sent for the next 3 months.

## 2023-06-18 NOTE — Progress Notes (Signed)
 BP 131/83 (BP Location: Left Arm, Cuff Size: Large)   Pulse 82   Temp 98.4 F (36.9 C) (Oral)   Ht 5\' 2"  (1.575 m)   Wt 239 lb 9.6 oz (108.7 kg)   SpO2 98%   BMI 43.82 kg/m    Subjective:    Patient ID: [name], female    DOB: 1985/10/02, 38 y.o.   MRN: [id number]  HPI: [name] is a 38 y.o. female  Chief Complaint  Patient presents with   ADHD   Anxiety   ADHD FOLLOW UP + PCOS (WEIGHT LOSS) Continues on Adderall XR 30 MG daily, which offers her benefit, mood and focus at work and school.  PDMP last fill was 05/30/23. ADHD status: controlled Satisfied with current therapy: yes Medication compliance:  good compliance Controlled substance contract: yes Previous psychiatry evaluation: no Previous medications: yes adderall and adderall XR   Taking meds on weekends/vacations: yes Work/school performance:  good Difficulty sustaining attention/completing tasks: no Distracted by extraneous stimuli: no Does not listen when spoken to: no  Fidgets with hands or feet: no Unable to stay in seat: no Blurts out/interrupts others: no ADHD Medication Side Effects: no    Decreased appetite: no    Headache: no    Sleeping disturbance pattern: no    Irritability: no    Rebound effects (worse than baseline) off medication: no    Anxiousness: no    Dizziness: no    Tics: no   ANXIETY/STRESS Taking Effexor XR 75 MG for anxiety, is tolerating this well.  Duration: stable Anxious mood: improving Excessive worrying: improving Irritability: improving Sweating: no Nausea: no Palpitations:no Hyperventilation: no Panic attacks: improving Agoraphobia: no  Obscessions/compulsions: no Depressed mood: improving    2023/06/25    1:14 PM 08/21/2022    1:04 PM 02/19/2022    3:42 PM 12/05/2021    8:33 AM 08/15/2021   11:21 AM  Depression screen PHQ 2/9  Decreased Interest 1 0 1 2 1   Down, Depressed, Hopeless 0 0 1 2 1   PHQ - 2 Score 1 0 2 4 2   Altered sleeping 2  3 2 2 2   Tired, decreased energy 2 3 1 2 1   Change in appetite 0 0 1 2 1   Feeling bad or failure about yourself  0 0 1 3 0  Trouble concentrating 1 3 1 2 1   Moving slowly or fidgety/restless 0 0 0 1 0  Suicidal thoughts 0 0 0 0 0  PHQ-9 Score 6 9 8 16 7   Difficult doing work/chores Not difficult at all Somewhat difficult  Somewhat difficult   Anhedonia: no Weight changes: no Insomnia: yes hard to fall asleep  Hypersomnia: no Fatigue/loss of energy: no Feelings of worthlessness: no Feelings of guilt: no Impaired concentration/indecisiveness: no Suicidal ideations: no  Crying spells: no Recent Stressors/Life Changes: yes   Relationship problems: no   Family stress: yes     Financial stress: yes    Job stress: no    Recent death/loss: no     June 25, 2023    1:14 PM 08/21/2022    1:06 PM 02/19/2022    3:42 PM 12/05/2021    8:34 AM  GAD 7 : Generalized Anxiety Score  Nervous, Anxious, on Edge 0 1 1 3   Control/stop worrying 0 1 1 2   Worry too much - different things 1 1 1 2   Trouble relaxing 1 3 2 3   Restless 0 1 1 1   Easily annoyed or irritable 1  3 2 3   Afraid - awful might happen 0 0 0 1  Total GAD 7 Score 3 10 8 15   Anxiety Difficulty Not difficult at all Somewhat difficult Somewhat difficult Somewhat difficult   Relevant past medical, surgical, family and social history reviewed and updated as indicated. Interim medical history since our last visit reviewed. Allergies and medications reviewed and updated.  Review of Systems  Constitutional:  Negative for activity change, appetite change, diaphoresis, fatigue and fever.  Respiratory:  Negative for cough, chest tightness and shortness of breath.   Cardiovascular:  Negative for chest pain, palpitations and leg swelling.  Gastrointestinal: Negative.   Neurological: Negative.   Psychiatric/Behavioral:  Positive for decreased concentration and sleep disturbance. Negative for self-injury and suicidal ideas. The patient is  nervous/anxious.    Per HPI unless specifically indicated above     Objective:    BP 131/83 (BP Location: Left Arm, Cuff Size: Large)   Pulse 82   Temp 98.4 F (36.9 C) (Oral)   Ht 5\' 2"  (1.575 m)   Wt 239 lb 9.6 oz (108.7 kg)   SpO2 98%   BMI 43.82 kg/m   Wt Readings from Last 3 Encounters:  06/18/23 239 lb 9.6 oz (108.7 kg)  08/21/22 190 lb (86.2 kg)  05/21/22 178 lb (80.7 kg)    Physical Exam Vitals and nursing note reviewed.  Constitutional:      General: She is awake. She is not in acute distress.    Appearance: She is well-developed and well-groomed. She is obese. She is not ill-appearing or toxic-appearing.  HENT:     Head: Normocephalic.     Right Ear: Hearing normal.     Left Ear: Hearing normal.     Nose: Nose normal.     Mouth/Throat:     Mouth: Mucous membranes are moist.  Eyes:     General: Lids are normal.        Right eye: No discharge.        Left eye: No discharge.     Conjunctiva/sclera: Conjunctivae normal.     Pupils: Pupils are equal, round, and reactive to light.  Neck:     Thyroid: No thyromegaly.     Vascular: No carotid bruit or JVD.  Cardiovascular:     Rate and Rhythm: Normal rate and regular rhythm.     Heart sounds: Normal heart sounds. No murmur heard.    No gallop.  Pulmonary:     Effort: Pulmonary effort is normal.     Breath sounds: Normal breath sounds.  Abdominal:     General: Bowel sounds are normal.     Palpations: Abdomen is soft.  Musculoskeletal:     Cervical back: Normal range of motion and neck supple.     Right lower leg: No edema.     Left lower leg: No edema.  Lymphadenopathy:     Cervical: No cervical adenopathy.  Skin:    General: Skin is warm and dry.  Neurological:     Mental Status: She is alert and oriented to person, place, and time.  Psychiatric:        Attention and Perception: Attention normal.        Mood and Affect: Mood normal.        Behavior: Behavior normal. Behavior is cooperative.         Thought Content: Thought content normal.        Judgment: Judgment normal.    Results for orders placed or performed in  visit on 06/18/23  Cytology - PAP   Collection Time: 11/12/22 12:00 AM  Result Value Ref Range   CYTOLOGY - PAP negative    HPV Aptima negative       Assessment & Plan:   Problem List Items Addressed This Visit       Endocrine   PCOS (polycystic ovarian syndrome)   Relevant Orders   CBC with Differential/Platelet   Comprehensive metabolic panel   Lipid Panel w/o Chol/HDL Ratio   HgB A1c     Other   ADHD (attention deficit hyperactivity disorder) - Primary   Ongoing, started in childhood.  Will continue Adderall XR 30 MG daily, to assist her with school and working full-time, does offer benefit.  In past did not tolerate Ritalin or Concerta per her report, will avoid these.  UDS due next 06/07/24 and contract up to date.  She is aware of need for every 3 month visits and yearly UDS, agrees with this plan.  Predated refills sent for the next 3 months.      Relevant Orders   [id number] 11+Oxyco+Alc+Crt-Bund   Elevated TSH   Relevant Orders   TSH   Situational anxiety   Chronic, stable.  Denies SI/HI.  Continue Effexor XR 75 MG daily dosing and adjust further as needed.  Recommend meditation and relaxation exercises at home.        Relevant Medications   venlafaxine XR (EFFEXOR XR) 75 MG 24 hr capsule   Vitamin D deficiency   Relevant Orders   VITAMIN D 25 Hydroxy (Vit-D Deficiency, Fractures)      Follow up plan: Return in about 6 months (around 12/19/2023) for ADD, ANXIETY, PCOS -- outpatient labs beforehand, visit can be virtual.

## 2023-06-22 LAB — DRUG SCREEN 764883 11+OXYCO+ALC+CRT-BUND
BENZODIAZ UR QL: NEGATIVE ng/mL
Barbiturate: NEGATIVE ng/mL
Cannabinoid Quant, Ur: NEGATIVE ng/mL
Cocaine (Metabolite): NEGATIVE ng/mL
Creatinine: 27.3 mg/dL (ref 20.0–300.0)
Ethanol: NEGATIVE %
Meperidine: NEGATIVE ng/mL
Methadone Screen, Urine: NEGATIVE ng/mL
OPIATE SCREEN URINE: NEGATIVE ng/mL
Oxycodone/Oxymorphone, Urine: NEGATIVE ng/mL
Phencyclidine: NEGATIVE ng/mL
Propoxyphene: NEGATIVE ng/mL
Tramadol: NEGATIVE ng/mL
pH, Urine: 5.9 (ref 4.5–8.9)

## 2023-06-22 LAB — DRUG PROFILE 799016
Amphetamine GC/MS Conf: 2743 ng/mL
Amphetamine: POSITIVE — AB
Amphetamines: POSITIVE — AB
Methamphetamine: NEGATIVE

## 2023-08-26 ENCOUNTER — Encounter: Payer: Self-pay | Admitting: Nurse Practitioner

## 2023-08-26 ENCOUNTER — Ambulatory Visit: Admitting: Nurse Practitioner

## 2023-08-26 VITALS — BP 127/75 | HR 80 | Temp 98.2°F | Ht 62.0 in | Wt 247.0 lb

## 2023-08-26 DIAGNOSIS — R10A0 Flank pain, unspecified side: Secondary | ICD-10-CM

## 2023-08-26 DIAGNOSIS — R7989 Other specified abnormal findings of blood chemistry: Secondary | ICD-10-CM

## 2023-08-26 DIAGNOSIS — E282 Polycystic ovarian syndrome: Secondary | ICD-10-CM | POA: Diagnosis not present

## 2023-08-26 DIAGNOSIS — Z6834 Body mass index (BMI) 34.0-34.9, adult: Secondary | ICD-10-CM

## 2023-08-26 DIAGNOSIS — E559 Vitamin D deficiency, unspecified: Secondary | ICD-10-CM

## 2023-08-26 DIAGNOSIS — N3010 Interstitial cystitis (chronic) without hematuria: Secondary | ICD-10-CM

## 2023-08-26 DIAGNOSIS — R109 Unspecified abdominal pain: Secondary | ICD-10-CM | POA: Diagnosis not present

## 2023-08-26 DIAGNOSIS — R1031 Right lower quadrant pain: Secondary | ICD-10-CM | POA: Diagnosis not present

## 2023-08-26 DIAGNOSIS — Z87442 Personal history of urinary calculi: Secondary | ICD-10-CM | POA: Diagnosis not present

## 2023-08-26 DIAGNOSIS — E66811 Obesity, class 1: Secondary | ICD-10-CM

## 2023-08-26 DIAGNOSIS — E6609 Other obesity due to excess calories: Secondary | ICD-10-CM

## 2023-08-26 LAB — MICROSCOPIC EXAMINATION
Bacteria, UA: NONE SEEN
WBC, UA: NONE SEEN /HPF (ref 0–5)

## 2023-08-26 LAB — WET PREP FOR TRICH, YEAST, CLUE
Clue Cell Exam: NEGATIVE
Trichomonas Exam: NEGATIVE
Yeast Exam: NEGATIVE

## 2023-08-26 LAB — URINALYSIS, ROUTINE W REFLEX MICROSCOPIC
Bilirubin, UA: NEGATIVE
Glucose, UA: NEGATIVE
Ketones, UA: NEGATIVE
Leukocytes,UA: NEGATIVE
Nitrite, UA: NEGATIVE
Protein,UA: NEGATIVE
Specific Gravity, UA: 1.025 (ref 1.005–1.030)
Urobilinogen, Ur: 0.2 mg/dL (ref 0.2–1.0)
pH, UA: 6 (ref 5.0–7.5)

## 2023-08-26 MED ORDER — TAMSULOSIN HCL 0.4 MG PO CAPS: 0.4000 mg | ORAL_CAPSULE | Freq: Every day | ORAL | 0 refills | Status: DC

## 2023-08-26 MED ORDER — HYDROCODONE-ACETAMINOPHEN 5-325 MG PO TABS: 1.0000 | ORAL_TABLET | Freq: Four times a day (QID) | ORAL | 0 refills | Status: AC | PRN

## 2023-08-26 MED ORDER — CYCLOBENZAPRINE HCL 10 MG PO TABS: 10.0000 mg | ORAL_TABLET | Freq: Three times a day (TID) | ORAL | 0 refills | Status: DC | PRN

## 2023-08-26 NOTE — Assessment & Plan Note (Signed)
Chronic, ongoing, recommend she initiate supplement for this.  Recheck level today. ?

## 2023-08-26 NOTE — Assessment & Plan Note (Signed)
 Chronic, ongoing, taking Spironolactone . Was taking Wegovy , but insurance stopped coverage of this, it was very beneficial to her PCOS and now noticing more symptoms.  Could consider return to Metformin  in future if needed.  Labs today.

## 2023-08-26 NOTE — Assessment & Plan Note (Signed)
Noted on past labs, with improvement recent check.  TSH on labs today. ?

## 2023-08-26 NOTE — Progress Notes (Signed)
 BP 127/75   Pulse 80   Temp 98.2 F (36.8 C) (Oral)   Ht 5\' 2"  (1.575 m)   Wt 247 lb (112 kg)   SpO2 98%   BMI 45.18 kg/m    Subjective:    Patient ID: [name], female    DOB: 06/17/1985, 38 y.o.   MRN: [id number]  HPI: [name] is a 38 y.o. female  Chief Complaint  Patient presents with   Pain    Patient states she has been having R side and back pain for the last 3 weeks. States the pain has gradually gotten worse. States she has noticed some blood in her urine and burning with urination off and on for the last 3 weeks as well. Patient states she feels like her kidney is swollen   Would like all her future labs drawn today for her chronic issues.  URINARY SYMPTOMS Presents for right lower abdominal pain + right sided back pain for 3 weeks with small amount blood noted in urine and some burning. Took Azo which helped burning sensation go away.  Then over weekend started burning again and back pain is getting worse. Urology last seen 02/08/22 for interstitial cystitis. Has had kidney stones before.  Last CT renal 11/30/21 noted multiple punctate nonobstructing renal calculi bilaterally. Dysuria: burning Urinary frequency: yes Urgency: yes Small volume voids: yes Symptom severity: no Urinary incontinence: no Foul odor: no Hematuria: yes Abdominal pain: yes Back pain: yes Suprapubic pain/pressure: no Flank pain: yes Fever:  no Vomiting: no Relief with pyridium : yes Status: worse Previous urinary tract infection: yes Recurrent urinary tract infection: no Sexual activity: monogamous Treatments attempted: Pyridium  + increasing water intake -- took Flomax  3 weeks ago    Relevant past medical, surgical, family and social history reviewed and updated as indicated. Interim medical history since our last visit reviewed. Allergies and medications reviewed and updated.  Review of Systems  Constitutional:  Positive for fatigue. Negative for activity  change, appetite change, diaphoresis and fever.  Respiratory:  Negative for cough, chest tightness, shortness of breath and wheezing.   Cardiovascular:  Negative for chest pain, palpitations and leg swelling.  Gastrointestinal:  Positive for abdominal distention and abdominal pain. Negative for blood in stool, constipation, diarrhea, nausea and vomiting.  Genitourinary:  Positive for dysuria, flank pain, frequency, hematuria and urgency. Negative for decreased urine volume.  Neurological:  Negative for dizziness, tremors, weakness, light-headedness, numbness and headaches.    Per HPI unless specifically indicated above     Objective:     BP 127/75   Pulse 80   Temp 98.2 F (36.8 C) (Oral)   Ht 5\' 2"  (1.575 m)   Wt 247 lb (112 kg)   SpO2 98%   BMI 45.18 kg/m   Wt Readings from Last 3 Encounters:  08/26/23 247 lb (112 kg)  06/18/23 239 lb 9.6 oz (108.7 kg)  08/21/22 190 lb (86.2 kg)    Physical Exam Vitals and nursing note reviewed.  Constitutional:      General: She is awake. She is not in acute distress.    Appearance: She is well-developed and well-groomed. She is obese. She is not ill-appearing or toxic-appearing.  HENT:     Head: Normocephalic.     Right Ear: Hearing and external ear normal.     Left Ear: Hearing and external ear normal.  Eyes:     General: Lids are normal.        Right eye: No discharge.  Left eye: No discharge.     Conjunctiva/sclera: Conjunctivae normal.     Pupils: Pupils are equal, round, and reactive to light.  Neck:     Thyroid : No thyromegaly.     Vascular: No carotid bruit.  Cardiovascular:     Rate and Rhythm: Normal rate and regular rhythm.     Heart sounds: Normal heart sounds. No murmur heard.    No gallop.  Pulmonary:     Effort: Pulmonary effort is normal. No accessory muscle usage or respiratory distress.     Breath sounds: Normal breath sounds. No decreased breath sounds, wheezing or rales.  Abdominal:     General: Bowel  sounds are normal. There is no distension.     Palpations: Abdomen is soft.     Tenderness: There is no abdominal tenderness. There is right CVA tenderness. There is no left CVA tenderness, guarding or rebound.  Musculoskeletal:     Cervical back: Normal range of motion and neck supple.     Right lower leg: No edema.     Left lower leg: No edema.  Lymphadenopathy:     Cervical: No cervical adenopathy.  Skin:    General: Skin is warm and dry.  Neurological:     Mental Status: She is alert and oriented to person, place, and time.     Deep Tendon Reflexes: Reflexes are normal and symmetric.     Reflex Scores:      Brachioradialis reflexes are 2+ on the right side and 2+ on the left side.      Patellar reflexes are 2+ on the right side and 2+ on the left side. Psychiatric:        Attention and Perception: Attention normal.        Mood and Affect: Mood normal.        Speech: Speech normal.        Behavior: Behavior normal. Behavior is cooperative.        Thought Content: Thought content normal.    Results for orders placed or performed in visit on 06/18/23  Cytology - PAP   Collection Time: 11/12/22 12:00 AM  Result Value Ref Range   CYTOLOGY - PAP negative    HPV Aptima negative   [id number] 11+Oxyco+Alc+Crt-Bund   Collection Time: 06/18/23  1:30 PM  Result Value Ref Range   Ethanol Negative Cutoff=0.020 %   Amphetamines, Urine See Final Results Cutoff=1000 ng/mL   Barbiturate Negative Cutoff=200 ng/mL   BENZODIAZ UR QL Negative Cutoff=200 ng/mL   Cannabinoid Quant, Ur Negative Cutoff=50 ng/mL   Cocaine (Metabolite) Negative Cutoff=300 ng/mL   OPIATE SCREEN URINE Negative Cutoff=300 ng/mL   Oxycodone /Oxymorphone, Urine Negative Cutoff=300 ng/mL   Phencyclidine Negative Cutoff=25 ng/mL   Methadone Screen, Urine Negative Cutoff=300 ng/mL   Propoxyphene Negative Cutoff=300 ng/mL   Meperidine Negative Cutoff=200 ng/mL   Tramadol  Negative Cutoff=200 ng/mL   Creatinine 27.3 20.0 -  300.0 mg/dL   pH, Urine 5.9 4.5 - 8.9  Drug Profile [id number]   Collection Time: 06/18/23  1:30 PM  Result Value Ref Range   Amphetamines Positive (A) Cutoff=1000   Amphetamine  Positive (A)    Amphetamine  GC/MS Conf 2,743 Cutoff=500 ng/mL   Methamphetamine Negative Cutoff=500      Assessment & Plan:   Problem List Items Addressed This Visit       Endocrine   PCOS (polycystic ovarian syndrome)   Chronic, ongoing, taking Spironolactone . Was taking Wegovy , but insurance stopped coverage of this, it was very beneficial to her  PCOS and now noticing more symptoms.  Could consider return to Metformin  in future if needed.  Labs today.      Relevant Orders   Lipid Panel w/o Chol/HDL Ratio   HgB A1c     Genitourinary   CYSTITIS, CHRONIC INTERSTITIAL   Chronic, UA today overall reassuring with exception of trace blood in urine.  Refer to kidney stones POC.      Relevant Medications   cyclobenzaprine  (FLEXERIL ) 10 MG tablet   HYDROcodone -acetaminophen  (NORCO/VICODIN) 5-325 MG tablet     Other   Vitamin D  deficiency   Chronic, ongoing, recommend she initiate supplement for this.  Recheck level today.      Relevant Orders   VITAMIN D  25 Hydroxy (Vit-D Deficiency, Fractures)   Obesity   BMI 45.18, trending up, had lost 74 pounds with Wegovy  2.4 MG.  This offered benefit to PCOS and overall health.  Insurance no longer covering Wegovy . We tried Topamax  with no benefit to weight loss and side effects presented.  Recommended eating smaller high protein, low fat meals more frequently and exercising 30 mins a day 5 times a week with a goal of 10-15lb weight loss in the next 3 months. Patient voiced their understanding and motivation to adhere to these recommendations.         History of kidney stones   With current acute symptoms for 3 weeks and trace blood in urine.  Suspect stone at present.  Will obtain STAT CT renal study to further assess.  Has known stones, but these may be changing  position or larger since 2023.  Start Flomax  daily + Flexeril  and Norco as needed.  Will get into urology dependent on CT findings, she is established locally.      Relevant Orders   CT RENAL STONE STUDY   CBC with Differential/Platelet   Comprehensive metabolic panel with GFR   Flank pain - Primary   History of kidney stones.  With current acute symptoms for 3 weeks and trace blood in urine.  Suspect stone at present.  Will obtain STAT CT renal study to further assess.  Has known stones, but these may be changing position or larger since 2023.  Start Flomax  daily + Flexeril  and Norco as needed.  Will get into urology dependent on CT findings, she is established locally.      Relevant Orders   Urinalysis, Routine w reflex microscopic   WET PREP FOR TRICH, YEAST, CLUE   CT RENAL STONE STUDY   CBC with Differential/Platelet   Comprehensive metabolic panel with GFR   Elevated TSH   Noted on past labs, with improvement recent check.  TSH on labs today.      Relevant Orders   Comprehensive metabolic panel with GFR   TSH   Other Visit Diagnoses       Right lower quadrant abdominal pain       Refer to flank pain plan of care.   Relevant Orders   CT RENAL STONE STUDY        Follow up plan: Return if symptoms worsen or fail to improve.

## 2023-08-26 NOTE — Patient Instructions (Signed)
Kidney Stones  Kidney stones are solid, rock-like deposits that form inside of the kidneys. The kidneys are a pair of organs that make urine. A kidney stone may form in a kidney and move into other parts of the urinary tract, including the tubes that connect the kidneys to the bladder (ureters), the bladder, and the tube that carries urine out of the body (urethra). As the stone moves through these areas, it can cause intense pain and block the flow of urine. Kidney stones are created when high levels of certain minerals are found in the urine. The stones are usually passed out of the body through urination, but in some cases, medical treatment may be needed to remove them. What are the causes? Kidney stones may be caused by: A condition in which certain glands produce too much parathyroid hormone (primary hyperparathyroidism), which causes too much calcium buildup in the blood. A buildup of uric acid crystals in the bladder (hyperuricosuria). Uric acid is a chemical that the body produces when you eat certain foods. It usually leaves the body in the urine. Narrowing (stricture) of one or both of the ureters. A kidney blockage that is present at birth (congenital obstruction). Past surgery on the kidney or the ureters. What increases the risk? The following factors may make you more likely to develop this condition: Having had a kidney stone in the past. Having a family history of kidney stones. Not drinking enough water. Eating a diet that is high in protein, salt (sodium), or sugar. Being overweight or obese. What are the signs or symptoms? Symptoms of a kidney stone may include: Pain in the side of the abdomen, right below the ribs (flank pain). Pain usually spreads (radiates) to the groin. Needing to urinate often or urgently. Painful urination. Blood in the urine (hematuria). Nausea. Vomiting. Fever and chills. How is this diagnosed? This condition may be diagnosed based on: Your  symptoms and medical history. A physical exam. Blood tests. Urine tests. These may be done before and after the stone passes out of your body through urination. Imaging tests, such as a CT scan, abdominal X-ray, or ultrasound. A procedure to examine the inside of the bladder (cystoscopy). How is this treated? Treatment for kidney stones depends on the size, location, and makeup of the stones. Kidney stones will often pass out of the body through urination. You may need to: Increase your fluid intake to help pass the stone. In some cases, you may be given fluids through an IV and may need to be monitored in the hospital. Take medicine for pain. Make changes in your diet to help prevent kidney stones from coming back. Sometimes, procedures are needed to remove a kidney stone. This may involve: A procedure to break up kidney stones using: A focused beam of light (laser therapy). Shock waves (extracorporeal shock wave lithotripsy). Surgery to remove kidney stones. This may be needed if you have severe pain or have stones that block your urinary tract. Follow these instructions at home: Medicines Take over-the-counter and prescription medicines only as told by your health care provider. Ask your health care provider if the medicine prescribed to you requires you to avoid driving or using heavy machinery. Eating and drinking Drink enough fluid to keep your urine pale yellow. You may be instructed to drink at least 8-10 glasses of water each day. This will help you pass the kidney stone. If directed, change your diet. This may include: Limiting how much sodium you eat. Eating more fruits   and vegetables. Limiting how much animal protein you eat. Animal proteins include red meat, poultry, fish, and eggs. Eating a normal amount of calcium (1,000-1,300 mg per day). Follow instructions from your health care provider about eating or drinking restrictions. General instructions Collect urine samples as  told by your health care provider. You may need to collect a urine sample: 24 hours after you pass the stone. 8-12 weeks after you pass the kidney stone, and every 6-12 months after that. Strain your urine every time you urinate, for as long as directed. Use the strainer that your health care provider recommends. Do not throw out the kidney stone after passing it. Keep the stone so it can be tested by your health care provider. Testing the makeup of your kidney stone may help prevent you from getting kidney stones in the future. Keep all follow-up visits. You may need follow-up X-rays or ultrasounds to make sure that your stone has passed. How is this prevented? To prevent another kidney stone: Drink enough fluid to keep your urine pale yellow. This is the best way to prevent kidney stones. Eat a healthy diet. Follow recommendations from your health care provider about foods to avoid. Recommendations vary depending on the type of kidney stone that you have. You may be instructed to eat a low-protein diet. Maintain a healthy weight. Where to find more information National Kidney Foundation (NKF): www.kidney.org [facility] ([facility]): www.urologyhealth.org Contact a health care provider if: You have pain that gets worse or does not get better with medicine. Get help right away if: You have a fever or chills. You develop severe pain. You develop new abdominal pain. You faint. You are unable to urinate. Summary Kidney stones are solid, rock-like deposits that form inside of the kidneys. Kidney stones can cause nausea, vomiting, blood in the urine, abdominal pain, and the urge to urinate often. Treatment for kidney stones depends on the size, location, and makeup of the stones. Kidney stones will often pass out of the body through urination. Kidney stones can be prevented by drinking enough fluids, eating a healthy diet, and maintaining a healthy weight. This information is not intended  to replace advice given to you by your health care provider. Make sure you discuss any questions you have with your health care provider. Document Revised: 07/05/2021 Document Reviewed: 07/05/2021 Elsevier Patient Education  2024 [employer]  

## 2023-08-26 NOTE — Assessment & Plan Note (Signed)
 BMI 45.18, trending up, had lost 74 pounds with Wegovy  2.4 MG.  This offered benefit to PCOS and overall health.  Insurance no longer covering Wegovy . We tried Topamax  with no benefit to weight loss and side effects presented.  Recommended eating smaller high protein, low fat meals more frequently and exercising 30 mins a day 5 times a week with a goal of 10-15lb weight loss in the next 3 months. Patient voiced their understanding and motivation to adhere to these recommendations.

## 2023-08-26 NOTE — Assessment & Plan Note (Signed)
 With current acute symptoms for 3 weeks and trace blood in urine.  Suspect stone at present.  Will obtain STAT CT renal study to further assess.  Has known stones, but these may be changing position or larger since 2023.  Start Flomax  daily + Flexeril  and Norco as needed.  Will get into urology dependent on CT findings, she is established locally.

## 2023-08-26 NOTE — Assessment & Plan Note (Signed)
 Chronic, UA today overall reassuring with exception of trace blood in urine.  Refer to kidney stones POC.

## 2023-08-26 NOTE — Assessment & Plan Note (Signed)
 History of kidney stones.  With current acute symptoms for 3 weeks and trace blood in urine.  Suspect stone at present.  Will obtain STAT CT renal study to further assess.  Has known stones, but these may be changing position or larger since 2023.  Start Flomax  daily + Flexeril  and Norco as needed.  Will get into urology dependent on CT findings, she is established locally.

## 2023-08-27 ENCOUNTER — Ambulatory Visit
Admission: RE | Admit: 2023-08-27 | Discharge: 2023-08-27 | Disposition: A | Discharge status: Home / Self Care | Source: Ambulatory Visit | Attending: Nurse Practitioner | Admitting: Nurse Practitioner

## 2023-08-27 ENCOUNTER — Ambulatory Visit: Payer: Self-pay | Admitting: Nurse Practitioner

## 2023-08-27 ENCOUNTER — Telehealth: Payer: Self-pay

## 2023-08-27 DIAGNOSIS — R109 Unspecified abdominal pain: Secondary | ICD-10-CM | POA: Insufficient documentation

## 2023-08-27 DIAGNOSIS — R310 Gross hematuria: Secondary | ICD-10-CM | POA: Diagnosis not present

## 2023-08-27 DIAGNOSIS — R1031 Right lower quadrant pain: Secondary | ICD-10-CM | POA: Insufficient documentation

## 2023-08-27 DIAGNOSIS — N200 Calculus of kidney: Secondary | ICD-10-CM | POA: Diagnosis not present

## 2023-08-27 DIAGNOSIS — Z9049 Acquired absence of other specified parts of digestive tract: Secondary | ICD-10-CM | POA: Diagnosis not present

## 2023-08-27 DIAGNOSIS — Z87442 Personal history of urinary calculi: Secondary | ICD-10-CM | POA: Insufficient documentation

## 2023-08-27 LAB — CBC WITH DIFFERENTIAL/PLATELET
Basophils Absolute: 0 10*3/uL (ref 0.0–0.2)
Basos: 0 %
EOS (ABSOLUTE): 0.1 10*3/uL (ref 0.0–0.4)
Eos: 2 %
Hematocrit: 42.6 % (ref 34.0–46.6)
Hemoglobin: 14.3 g/dL (ref 11.1–15.9)
Immature Grans (Abs): 0 10*3/uL (ref 0.0–0.1)
Immature Granulocytes: 0 %
Lymphocytes Absolute: 2.6 10*3/uL (ref 0.7–3.1)
Lymphs: 39 %
MCH: 32.1 pg (ref 26.6–33.0)
MCHC: 33.6 g/dL (ref 31.5–35.7)
MCV: 96 fL (ref 79–97)
Monocytes Absolute: 0.3 10*3/uL (ref 0.1–0.9)
Monocytes: 5 %
Neutrophils Absolute: 3.5 10*3/uL (ref 1.4–7.0)
Neutrophils: 54 %
Platelets: 348 10*3/uL (ref 150–450)
RBC: 4.46 x10E6/uL (ref 3.77–5.28)
RDW: 11.9 % (ref 11.7–15.4)
WBC: 6.6 10*3/uL (ref 3.4–10.8)

## 2023-08-27 LAB — COMPREHENSIVE METABOLIC PANEL WITH GFR
ALT: 30 IU/L (ref 0–32)
AST: 17 IU/L (ref 0–40)
Albumin: 4.4 g/dL (ref 3.9–4.9)
Alkaline Phosphatase: 114 IU/L (ref 44–121)
BUN/Creatinine Ratio: 20 (ref 9–23)
BUN: 14 mg/dL (ref 6–20)
Bilirubin Total: 0.2 mg/dL (ref 0.0–1.2)
CO2: 23 mmol/L (ref 20–29)
Calcium: 9.6 mg/dL (ref 8.7–10.2)
Chloride: 101 mmol/L (ref 96–106)
Creatinine, Ser: 0.71 mg/dL (ref 0.57–1.00)
Globulin, Total: 2.3 g/dL (ref 1.5–4.5)
Glucose: 92 mg/dL (ref 70–99)
Potassium: 4.1 mmol/L (ref 3.5–5.2)
Sodium: 137 mmol/L (ref 134–144)
Total Protein: 6.7 g/dL (ref 6.0–8.5)
eGFR: 111 mL/min/{1.73_m2} (ref >59)

## 2023-08-27 LAB — TSH: TSH: 3.7 u[IU]/mL (ref 0.450–4.500)

## 2023-08-27 LAB — LIPID PANEL W/O CHOL/HDL RATIO
Cholesterol, Total: 177 mg/dL (ref 100–199)
HDL: 55 mg/dL (ref >39)
LDL Chol Calc (NIH): 95 mg/dL (ref 0–99)
Triglycerides: 155 mg/dL — ABNORMAL HIGH (ref 0–149)
VLDL Cholesterol Cal: 27 mg/dL (ref 5–40)

## 2023-08-27 LAB — VITAMIN D 25 HYDROXY (VIT D DEFICIENCY, FRACTURES): Vit D, 25-Hydroxy: 48.4 ng/mL (ref 30.0–100.0)

## 2023-08-27 LAB — HEMOGLOBIN A1C
Est. average glucose Bld gHb Est-mCnc: 100 mg/dL
Hgb A1c MFr Bld: 5.1 % (ref 4.8–5.6)

## 2023-08-27 NOTE — Telephone Encounter (Signed)
 Copied from CRM [id number]. Topic: General - Other >> Aug 27, 2023  9:11 AM Donald Frost wrote: Reason for CRM: The patient called to check on the status of her STAT CT and after speaking with [place] I was told it was with the referral team and that they maybe working on getting it approved through the insurance. I relayed the message to the patient and she is very anxious as she is very uncomfortable. Please assist patient as soon as possible

## 2023-08-27 NOTE — Progress Notes (Signed)
 Contacted via MyChart   Good afternoon [name], your imaging returned and overall shows a small amount of nonobstructive kidney stones.  How is your pain today? I would recommend if still present to schedule with urology due to your history with cystitis and kidney stones.  Let them know we have updated imaging and your current symptoms.:)

## 2023-08-27 NOTE — Telephone Encounter (Signed)
 Routing to referral team. Can someone check on this please? CT was ordered STAT yesterday.

## 2023-08-27 NOTE — Progress Notes (Signed)
 Contacted via MyChart   Good morning [name], your labs have returned and overall are stable.  Triglycerides a tad elevated, continue focus on diet and exercise.  Have you had imaging scheduled yet? I know they were waiting on insurance approval.  Any questions? Keep being amazing!!  Thank you for allowing me to participate in your care.  I appreciate you. Kindest regards, Sharley Keeler

## 2023-08-30 ENCOUNTER — Ambulatory Visit: Admitting: Physician Assistant

## 2023-08-30 ENCOUNTER — Encounter: Payer: Self-pay | Admitting: Physician Assistant

## 2023-08-30 VITALS — BP 136/83 | HR 92 | Ht 62.0 in | Wt 230.0 lb

## 2023-08-30 DIAGNOSIS — N3010 Interstitial cystitis (chronic) without hematuria: Secondary | ICD-10-CM | POA: Diagnosis not present

## 2023-08-30 DIAGNOSIS — R300 Dysuria: Secondary | ICD-10-CM | POA: Diagnosis not present

## 2023-08-30 LAB — MICROSCOPIC EXAMINATION: WBC, UA: >30 /HPF — AB (ref 0–5)

## 2023-08-30 LAB — URINALYSIS, COMPLETE
Bilirubin, UA: NEGATIVE
Glucose, UA: NEGATIVE
Ketones, UA: NEGATIVE
Nitrite, UA: NEGATIVE
Protein,UA: NEGATIVE
Specific Gravity, UA: 1.02 (ref 1.005–1.030)
Urobilinogen, Ur: 0.2 mg/dL (ref 0.2–1.0)
pH, UA: 6 (ref 5.0–7.5)

## 2023-08-30 MED ORDER — SULFAMETHOXAZOLE-TRIMETHOPRIM 800-160 MG PO TABS: 1.0000 | ORAL_TABLET | Freq: Two times a day (BID) | ORAL | 0 refills | Status: DC

## 2023-08-30 NOTE — Progress Notes (Signed)
 08/30/2023 1:18 PM   [name] Mar 26, 1986 [id number]  CC: Chief Complaint  Patient presents with   Other    Burning with urination   HPI: [name] is a 38 y.o. female with PMH nephrolithiasis and interstitial cystitis who presents today for evaluation of possible UTI.   She saw her PCP 4 days ago with reports of right flank pain x 3 weeks, gross hematuria, and dysuria.  Urine microscopy was bland.  White count was normal at 6.6.  Creatinine at baseline, 0.71.  She underwent a CT stone study, which showed bilateral nonobstructing renal stones measuring up to 2 mm on the right.  No hydronephrosis.  Today she reports she started having dysuria 2 days ago.  She question the possible IC flare and started Uribel, which has helped somewhat.  She denies fever, nausea, or vomiting.  She never saw stone pass.  In-office UA today positive for trace intact blood and 1+ leukocytes; urine microscopy with >30 WBCs/HPF, 3-10 RBCs/HPF, and many bacteria.  PMH: Past Medical History:  Diagnosis Date   ADHD (attention deficit hyperactivity disorder)    Allergy    Anxiety    Blood transfusion as the cause of abnormal reaction of patient, or of later complication, without misadventure at the time of procedure    lost to much blood during delivery   Depression    Interstitial cystitis    Kidney stones    PCOS (polycystic ovarian syndrome)    Sleep apnea     Surgical History: Past Surgical History:  Procedure Laterality Date   CHOLECYSTECTOMY  10/12/2021   interstitial cystitis laparoscopy     KIDNEY STONE SURGERY     basket, lithotripsy   KNEE ARTHROSCOPY Right    TONSILLECTOMY AND ADENOIDECTOMY     TUBAL LIGATION      Home Medications:  Allergies as of 08/30/2023       Reactions   Latex Anaphylaxis   Penicillins Nausea And Vomiting        Medication List        Accurate as of Aug 30, 2023  1:18 PM. If you have any questions, ask your nurse or doctor.           amphetamine -dextroamphetamine 30 MG 24 hr capsule Commonly known as: ADDERALL XR Take 1 capsule (30 mg total) by mouth every morning. What changed: Another medication with the same name was removed. Continue taking this medication, and follow the directions you see here. Changed by: Lineth Thielke   cetirizine 5 MG tablet Commonly known as: ZYRTEC Take 5 mg by mouth daily.   chlorhexidine  0.12 % solution Commonly known as: PERIDEX  SMARTSIG:By Mouth   Cholecalciferol  1.25 MG (50000 UT) Tabs Take 1 tablet by mouth once a week.   cyclobenzaprine  10 MG tablet Commonly known as: FLEXERIL  Take 1 tablet (10 mg total) by mouth 3 (three) times daily as needed for muscle spasms.   esomeprazole 20 MG capsule Commonly known as: NEXIUM Take 20 mg by mouth daily at 12 noon.   HYDROcodone -acetaminophen  5-325 MG tablet Commonly known as: NORCO/VICODIN Take 1 tablet by mouth every 6 (six) hours as needed for up to 5 days for moderate pain (pain score 4-6).   ondansetron  4 MG disintegrating tablet Commonly known as: ZOFRAN -ODT Dissolve 1 tablet (4 mg total) in the mouth every 8 (eight) hours as needed (Take 1 tablet (4 mg total) by mouth every 8 (eight) hours as needed)   spironolactone  100 MG tablet Commonly known  as: ALDACTONE  TAKE ONE TABLET BY MOUTH TWICE DAILY   tamsulosin  0.4 MG Caps capsule Commonly known as: FLOMAX  Take 1 capsule (0.4 mg total) by mouth daily.   valACYclovir  1000 MG tablet Commonly known as: VALTREX  Take 1 tablet (1,000 mg total) by mouth 2 (two) times daily for 10 days, then stop.  May repeat as needed for flares.   venlafaxine  XR 75 MG 24 hr capsule Commonly known as: Effexor  XR Take 1 capsule (75 mg total) by mouth daily with breakfast.        Allergies:  Allergies  Allergen Reactions   Latex Anaphylaxis   Penicillins Nausea And Vomiting    Family History: Family History  Problem Relation Age of Onset   Hyperlipidemia Mother     Hypertension Mother    Hypertension Father    Hyperlipidemia Father    Hyperlipidemia Sister    Hyperlipidemia Maternal Grandfather    Hypertension Maternal Grandfather    Hypertension Paternal Grandmother    Hyperlipidemia Paternal Grandmother    Hyperlipidemia Paternal Grandfather    Hypertension Paternal Grandfather     Social History:   reports that she has never smoked. She has never used smokeless tobacco. She reports current alcohol use. She reports that she does not use drugs.  Physical Exam: BP 136/83   Pulse 92   Ht 5\' 2"  (1.575 m)   Wt 230 lb (104.3 kg)   LMP 08/07/2023 (Approximate)   BMI 42.07 kg/m   Constitutional:  Alert and oriented, no acute distress, nontoxic appearing HEENT: [place], AT Cardiovascular: No clubbing, cyanosis, or edema Respiratory: Normal respiratory effort, no increased work of breathing Skin: No rashes, bruises or suspicious lesions Neurologic: Grossly intact, no focal deficits, moving all 4 extremities Psychiatric: Normal mood and affect  Laboratory Data: Results for orders placed or performed in visit on 08/30/23  Microscopic Examination   Collection Time: 08/30/23 12:58 PM   Urine  Result Value Ref Range   WBC, UA >30 (A) 0 - 5 /hpf   RBC, Urine 3-10 (A) 0 - 2 /hpf   Epithelial Cells (non renal) 0-10 0 - 10 /hpf   Casts Present (A) None seen /lpf   Cast Type Waxy casts (A) N/A   Mucus, UA Present (A) Not Estab.   Bacteria, UA Many (A) None seen/Few  Urinalysis, Complete   Collection Time: 08/30/23 12:58 PM  Result Value Ref Range   Specific Gravity, UA 1.020 1.005 - 1.030   pH, UA 6.0 5.0 - 7.5   Color, UA Green (A) Yellow   Appearance Ur Slightly cloudy Clear   Leukocytes,UA 1+ (A) Negative   Protein,UA Negative Negative/Trace   Glucose, UA Negative Negative   Ketones, UA Negative Negative   RBC, UA Trace (A) Negative   Bilirubin, UA Negative Negative   Urobilinogen, Ur 0.2 0.2 - 1.0 mg/dL   Nitrite, UA Negative Negative    Microscopic Examination See below:    Pertinent Imaging: Results for orders placed during the hospital encounter of 08/27/23  CT RENAL STONE STUDY  Narrative CLINICAL DATA:  Right flank pain with gross hematuria.  EXAM: CT ABDOMEN AND PELVIS WITHOUT CONTRAST  TECHNIQUE: Multidetector CT imaging of the abdomen and pelvis was performed following the standard protocol without IV contrast.  RADIATION DOSE REDUCTION: This exam was performed according to the departmental dose-optimization program which includes automated exposure control, adjustment of the mA and/or kV according to patient size and/or use of iterative reconstruction technique.  COMPARISON:  November 30, 2021.  FINDINGS: Lower chest: No acute abnormality.  Hepatobiliary: No focal liver abnormality is seen. Status post cholecystectomy. No biliary dilatation.  Pancreas: Unremarkable. No pancreatic ductal dilatation or surrounding inflammatory changes.  Spleen: Normal in size without focal abnormality.  Adrenals/Urinary Tract: Adrenal glands appear normal. Small bilateral nonobstructive renal calculi are noted. No hydronephrosis or renal obstruction is noted. Urinary bladder is decompressed.  Stomach/Bowel: Stomach is unremarkable. There is no evidence of bowel obstruction or inflammation. Multiple probable small phleboliths are noted in the appendix.  Vascular/Lymphatic: No significant vascular findings are present. No enlarged abdominal or pelvic lymph nodes.  Reproductive: Uterus and ovaries are unremarkable. Ligation clip is seen in pre rectal space as noted on prior exam.  Other: No ascites or hernia is noted.  Musculoskeletal: No acute or significant osseous findings.  IMPRESSION: Minimal bilateral nonobstructive nephrolithiasis.  No acute abnormality seen in the abdomen or pelvis.   Electronically Signed By: Rosalene Colon M.D. On: 08/27/2023 12:54   I personally reviewed the images  referenced above and note bilateral nonobstructing renal stones.  Assessment & Plan:   1. Dysuria (Primary) UA appears grossly infected today, will start empiric Bactrim  and send for culture for further evaluation.  Will extend antibiotics for 7 days in the setting of IC in the hope of down regulating her bladder and reducing her risk for flare.  Question possible passed stone as a source of her recent flank pain, though no evidence of active stone episode on recent imaging. - Urinalysis, Complete - CULTURE, URINE COMPREHENSIVE - sulfamethoxazole -trimethoprim  (BACTRIM  DS) 800-160 MG tablet; Take 1 tablet by mouth 2 (two) times daily for 7 days.  Dispense: 14 tablet; Refill: 0   Return if symptoms worsen or fail to improve.  Kathreen Pare, PA-C  [facility] [place] [address], [address] [place], [place] [address] [phone]

## 2023-09-04 ENCOUNTER — Ambulatory Visit: Payer: Self-pay | Admitting: Physician Assistant

## 2023-09-04 DIAGNOSIS — R300 Dysuria: Secondary | ICD-10-CM

## 2023-09-04 LAB — CULTURE, URINE COMPREHENSIVE

## 2023-09-05 MED ORDER — SULFAMETHOXAZOLE-TRIMETHOPRIM 800-160 MG PO TABS: 1.0000 | ORAL_TABLET | Freq: Two times a day (BID) | ORAL | 0 refills | Status: AC

## 2023-09-25 DIAGNOSIS — H2000 Unspecified acute and subacute iridocyclitis: Secondary | ICD-10-CM | POA: Diagnosis not present

## 2023-10-01 ENCOUNTER — Ambulatory Visit: Admitting: Nurse Practitioner

## 2023-10-04 DIAGNOSIS — H2000 Unspecified acute and subacute iridocyclitis: Secondary | ICD-10-CM | POA: Diagnosis not present

## 2023-10-25 ENCOUNTER — Other Ambulatory Visit: Payer: Self-pay | Admitting: Nurse Practitioner

## 2023-10-28 NOTE — Telephone Encounter (Signed)
 Requested medication (s) are due for refill today: yes  Requested medication (s) are on the active medication list: yes  Last refill:06/18/23  Future visit scheduled: yes  Notes to clinic:  Unable to refill per protocol, cannot delegate.      Requested Prescriptions  Pending Prescriptions Disp Refills   amphetamine -dextroamphetamine (ADDERALL XR) 30 MG 24 hr capsule [Pharmacy Med Name: AMPHETAMINE -DEXTROAMPHET ER 30 MG C] 30 capsule     Sig: TAKE 1 CAPSULE BY MOUTH EVERY MORNING     Not Delegated - Psychiatry:  Stimulants/ADHD Failed - 10/28/2023  8:26 AM      Failed - This refill cannot be delegated      Passed - Urine Drug Screen completed in last 360 days      Passed - Last BP in normal range    BP Readings from Last 1 Encounters:  08/30/23 136/83         Passed - Last Heart Rate in normal range    Pulse Readings from Last 1 Encounters:  08/30/23 92         Passed - Valid encounter within last 6 months    Recent Outpatient Visits           2 months ago Flank pain   [place] [facility] [place], [place] T, NP   4 months ago Attention deficit hyperactivity disorder (ADHD), combined type   [place] [facility] [place], Melanie DASEN, NP

## 2023-10-29 ENCOUNTER — Telehealth: Payer: Self-pay

## 2023-10-29 ENCOUNTER — Encounter: Payer: Self-pay | Admitting: Nurse Practitioner

## 2023-10-29 NOTE — Telephone Encounter (Signed)
 Copied from CRM [id number]. Topic: Clinical - Medication Question >> Oct 29, 2023 10:45 AM Myrick T wrote: Reason for CRM: patient called to find out why her medication for amphetamine -dextroamphetamine (ADDERALL XR) 30 MG 24 hr capsule was denied saying she needed an appt when she was seen on 5/19. Please f/u with patient

## 2023-10-29 NOTE — Telephone Encounter (Signed)
 Not seen for ADHD in May. Per Jolene's last note to be seen every 3 months. Has not been seen for it since March per my chart review.

## 2023-10-29 NOTE — Telephone Encounter (Signed)
 See my chart message

## 2023-10-29 NOTE — Telephone Encounter (Signed)
 appt

## 2023-11-08 NOTE — Patient Instructions (Signed)

## 2023-11-11 ENCOUNTER — Telehealth (INDEPENDENT_AMBULATORY_CARE_PROVIDER_SITE_OTHER): Admitting: Nurse Practitioner

## 2023-11-11 ENCOUNTER — Encounter: Payer: Self-pay | Admitting: Nurse Practitioner

## 2023-11-11 DIAGNOSIS — F902 Attention-deficit hyperactivity disorder, combined type: Secondary | ICD-10-CM | POA: Diagnosis not present

## 2023-11-11 MED ORDER — AMPHETAMINE-DEXTROAMPHETAMINE 20 MG PO TABS: 20.0000 mg | ORAL_TABLET | Freq: Every day | ORAL | 0 refills | Status: DC

## 2023-11-11 NOTE — Progress Notes (Signed)
 There were no vitals taken for this visit.   Subjective:    Patient ID: [name], female    DOB: 1985-07-15, 38 y.o.   MRN: [id number]  HPI: [name] is a 38 y.o. female  Chief Complaint  Patient presents with   ADHD   Virtual Visit via Video Note  I connected with [name] on 11/11/23 at  4:20 PM EDT by a video enabled telemedicine application and verified that I am speaking with the correct person using two identifiers.  Location: Patient: work Restaurant manager, fast food: work   I discussed the limitations of evaluation and management by telemedicine and the availability of in person appointments. The patient expressed understanding and agreed to proceed.  I discussed the assessment and treatment plan with the patient. The patient was provided an opportunity to ask questions and all were answered. The patient agreed with the plan and demonstrated an understanding of the instructions.   The patient was advised to call back or seek an in-person evaluation if the symptoms worsen or if the condition fails to improve as anticipated.  I provided 25 minutes of non-face-to-face time during this encounter.   Shyam Dawson T Arvid Marengo, NP   ADHD FOLLOW UP  Continues on Adderall XR 30 MG daily, which offers her benefit, mood and focus at work and school. Has been off of this for 2 weeks, but would like to reduce dosing since is now out of school. PDMP last fill was 09/18/23. ADHD status: controlled Satisfied with current therapy: yes Medication compliance:  good compliance Controlled substance contract: yes Previous psychiatry evaluation: no Previous medications: yes adderall and adderall XR   Taking meds on weekends/vacations: yes Work/school performance:  good Difficulty sustaining attention/completing tasks: over past 2 weeks Distracted by extraneous stimuli: no Does not listen when spoken to: no  Fidgets with hands or feet: no Unable to stay in seat: no Blurts  out/interrupts others: no ADHD Medication Side Effects: no    Decreased appetite: no    Headache: no    Sleeping disturbance pattern: no    Irritability: no    Rebound effects (worse than baseline) off medication: no    Anxiousness: no    Dizziness: no    Tics: no   Relevant past medical, surgical, family and social history reviewed and updated as indicated. Interim medical history since our last visit reviewed. Allergies and medications reviewed and updated.  Review of Systems  Constitutional:  Negative for activity change, appetite change, diaphoresis, fatigue and fever.  Respiratory:  Negative for cough, chest tightness and shortness of breath.   Cardiovascular:  Negative for chest pain, palpitations and leg swelling.  Gastrointestinal: Negative.   Neurological: Negative.   Psychiatric/Behavioral:  Positive for decreased concentration. Negative for self-injury, sleep disturbance and suicidal ideas. The patient is not nervous/anxious.    Per HPI unless specifically indicated above     Objective:    There were no vitals taken for this visit.  Wt Readings from Last 3 Encounters:  08/30/23 230 lb (104.3 kg)  08/26/23 247 lb (112 kg)  06/18/23 239 lb 9.6 oz (108.7 kg)    Physical Exam Vitals and nursing note reviewed.  Constitutional:      General: She is awake. She is not in acute distress.    Appearance: She is well-developed. She is not ill-appearing.  HENT:     Head: Normocephalic.     Right Ear: Hearing normal.     Left Ear: Hearing normal.  Eyes:  General: Lids are normal.        Right eye: No discharge.        Left eye: No discharge.     Conjunctiva/sclera: Conjunctivae normal.  Pulmonary:     Effort: Pulmonary effort is normal. No accessory muscle usage or respiratory distress.  Musculoskeletal:     Cervical back: Normal range of motion.  Neurological:     Mental Status: She is alert and oriented to person, place, and time.  Psychiatric:        Attention  and Perception: Attention normal.        Mood and Affect: Mood normal.        Behavior: Behavior normal. Behavior is cooperative.        Thought Content: Thought content normal.        Judgment: Judgment normal.    Results for orders placed or performed in visit on 08/30/23  Microscopic Examination   Collection Time: 08/30/23 12:58 PM   Urine  Result Value Ref Range   WBC, UA >30 (A) 0 - 5 /hpf   RBC, Urine 3-10 (A) 0 - 2 /hpf   Epithelial Cells (non renal) 0-10 0 - 10 /hpf   Casts Present (A) None seen /lpf   Cast Type Waxy casts (A) N/A   Mucus, UA Present (A) Not Estab.   Bacteria, UA Many (A) None seen/Few  Urinalysis, Complete   Collection Time: 08/30/23 12:58 PM  Result Value Ref Range   Specific Gravity, UA 1.020 1.005 - 1.030   pH, UA 6.0 5.0 - 7.5   Color, UA Green (A) Yellow   Appearance Ur Slightly cloudy Clear   Leukocytes,UA 1+ (A) Negative   Protein,UA Negative Negative/Trace   Glucose, UA Negative Negative   Ketones, UA Negative Negative   RBC, UA Trace (A) Negative   Bilirubin, UA Negative Negative   Urobilinogen, Ur 0.2 0.2 - 1.0 mg/dL   Nitrite, UA Negative Negative   Microscopic Examination See below:   CULTURE, URINE COMPREHENSIVE   Collection Time: 08/30/23  1:20 PM   Specimen: Urine   UR  Result Value Ref Range   Urine Culture, Comprehensive Final report (A)    Organism ID, Bacteria Escherichia coli (A)    ANTIMICROBIAL SUSCEPTIBILITY Comment       Assessment & Plan:   Problem List Items Addressed This Visit       Other   ADHD (attention deficit hyperactivity disorder) - Primary   Ongoing, started in childhood.  Will reduce Adderall to 20 MG daily, as she feels she does not need as much since is out of school -- she can take 10 to 20 MG daily, based on mood.  In past did not tolerate Ritalin or Concerta per her report, will avoid these.  UDS due next 06/07/24 and contract up to date.  She is aware of need for every 3 month visits and yearly UDS,  agrees with this plan.  Predated refills sent for the next 2 months.          Follow up plan: Return for as scheduled upcoming.

## 2023-11-11 NOTE — Assessment & Plan Note (Signed)
 Ongoing, started in childhood.  Will reduce Adderall to 20 MG daily, as she feels she does not need as much since is out of school -- she can take 10 to 20 MG daily, based on mood.  In past did not tolerate Ritalin or Concerta per her report, will avoid these.  UDS due next 06/07/24 and contract up to date.  She is aware of need for every 3 month visits and yearly UDS, agrees with this plan.  Predated refills sent for the next 2 months.

## 2023-11-24 ENCOUNTER — Encounter: Payer: Self-pay | Admitting: Nurse Practitioner

## 2023-11-25 ENCOUNTER — Other Ambulatory Visit: Payer: Self-pay | Admitting: Nurse Practitioner

## 2023-11-25 DIAGNOSIS — E559 Vitamin D deficiency, unspecified: Secondary | ICD-10-CM

## 2023-11-25 DIAGNOSIS — R7989 Other specified abnormal findings of blood chemistry: Secondary | ICD-10-CM

## 2023-11-25 DIAGNOSIS — Z13220 Encounter for screening for lipoid disorders: Secondary | ICD-10-CM

## 2023-11-25 DIAGNOSIS — E282 Polycystic ovarian syndrome: Secondary | ICD-10-CM

## 2023-11-25 DIAGNOSIS — L680 Hirsutism: Secondary | ICD-10-CM

## 2023-12-18 ENCOUNTER — Other Ambulatory Visit

## 2023-12-18 DIAGNOSIS — Z13220 Encounter for screening for lipoid disorders: Secondary | ICD-10-CM

## 2023-12-18 DIAGNOSIS — E282 Polycystic ovarian syndrome: Secondary | ICD-10-CM | POA: Diagnosis not present

## 2023-12-18 DIAGNOSIS — Z136 Encounter for screening for cardiovascular disorders: Secondary | ICD-10-CM | POA: Diagnosis not present

## 2023-12-18 DIAGNOSIS — R7989 Other specified abnormal findings of blood chemistry: Secondary | ICD-10-CM | POA: Diagnosis not present

## 2023-12-18 DIAGNOSIS — E559 Vitamin D deficiency, unspecified: Secondary | ICD-10-CM

## 2023-12-18 DIAGNOSIS — L680 Hirsutism: Secondary | ICD-10-CM | POA: Diagnosis not present

## 2023-12-18 NOTE — Patient Instructions (Signed)

## 2023-12-19 ENCOUNTER — Ambulatory Visit: Payer: Self-pay | Admitting: Nurse Practitioner

## 2023-12-19 LAB — CBC WITH DIFFERENTIAL/PLATELET
Basophils Absolute: 0 x10E3/uL (ref 0.0–0.2)
Basos: 1 %
EOS (ABSOLUTE): 0.1 x10E3/uL (ref 0.0–0.4)
Eos: 2 %
Hematocrit: 46.7 % — ABNORMAL HIGH (ref 34.0–46.6)
Hemoglobin: 15 g/dL (ref 11.1–15.9)
Immature Grans (Abs): 0 x10E3/uL (ref 0.0–0.1)
Immature Granulocytes: 0 %
Lymphocytes Absolute: 2.1 x10E3/uL (ref 0.7–3.1)
Lymphs: 39 %
MCH: 30.8 pg (ref 26.6–33.0)
MCHC: 32.1 g/dL (ref 31.5–35.7)
MCV: 96 fL (ref 79–97)
Monocytes Absolute: 0.3 x10E3/uL (ref 0.1–0.9)
Monocytes: 6 %
Neutrophils Absolute: 2.9 x10E3/uL (ref 1.4–7.0)
Neutrophils: 52 %
Platelets: 390 x10E3/uL (ref 150–450)
RBC: 4.87 x10E6/uL (ref 3.77–5.28)
RDW: 12 % (ref 11.7–15.4)
WBC: 5.4 x10E3/uL (ref 3.4–10.8)

## 2023-12-19 LAB — COMPREHENSIVE METABOLIC PANEL WITH GFR
ALT: 20 IU/L (ref 0–32)
AST: 20 IU/L (ref 0–40)
Albumin: 4.6 g/dL (ref 3.9–4.9)
Alkaline Phosphatase: 115 IU/L (ref 44–121)
BUN/Creatinine Ratio: 10 (ref 9–23)
BUN: 9 mg/dL (ref 6–20)
Bilirubin Total: 0.5 mg/dL (ref 0.0–1.2)
CO2: 20 mmol/L (ref 20–29)
Calcium: 9.7 mg/dL (ref 8.7–10.2)
Chloride: 100 mmol/L (ref 96–106)
Creatinine, Ser: 0.88 mg/dL (ref 0.57–1.00)
Globulin, Total: 2.6 g/dL (ref 1.5–4.5)
Glucose: 106 mg/dL — ABNORMAL HIGH (ref 70–99)
Potassium: 4.5 mmol/L (ref 3.5–5.2)
Sodium: 138 mmol/L (ref 134–144)
Total Protein: 7.2 g/dL (ref 6.0–8.5)
eGFR: 86 mL/min/1.73 (ref >59)

## 2023-12-19 LAB — LIPID PANEL W/O CHOL/HDL RATIO
Cholesterol, Total: 195 mg/dL (ref 100–199)
HDL: 70 mg/dL (ref >39)
LDL Chol Calc (NIH): 113 mg/dL — ABNORMAL HIGH (ref 0–99)
Triglycerides: 66 mg/dL (ref 0–149)
VLDL Cholesterol Cal: 12 mg/dL (ref 5–40)

## 2023-12-19 LAB — HEMOGLOBIN A1C
Est. average glucose Bld gHb Est-mCnc: 94 mg/dL
Hgb A1c MFr Bld: 4.9 % (ref 4.8–5.6)

## 2023-12-19 LAB — VITAMIN D 25 HYDROXY (VIT D DEFICIENCY, FRACTURES): Vit D, 25-Hydroxy: 37.6 ng/mL (ref 30.0–100.0)

## 2023-12-19 LAB — FSH/LH
FSH: 4.1 m[IU]/mL
LH: 3.4 m[IU]/mL

## 2023-12-19 LAB — TSH: TSH: 1.97 u[IU]/mL (ref 0.450–4.500)

## 2023-12-20 ENCOUNTER — Encounter: Payer: Self-pay | Admitting: Nurse Practitioner

## 2023-12-20 ENCOUNTER — Ambulatory Visit: Admitting: Nurse Practitioner

## 2023-12-20 VITALS — BP 126/85 | HR 88 | Temp 98.4°F | Resp 16 | Ht 62.01 in | Wt 249.4 lb

## 2023-12-20 DIAGNOSIS — H04129 Dry eye syndrome of unspecified lacrimal gland: Secondary | ICD-10-CM

## 2023-12-20 DIAGNOSIS — E282 Polycystic ovarian syndrome: Secondary | ICD-10-CM

## 2023-12-20 DIAGNOSIS — F418 Other specified anxiety disorders: Secondary | ICD-10-CM | POA: Diagnosis not present

## 2023-12-20 DIAGNOSIS — F902 Attention-deficit hyperactivity disorder, combined type: Secondary | ICD-10-CM | POA: Diagnosis not present

## 2023-12-20 DIAGNOSIS — E66813 Obesity, class 3: Secondary | ICD-10-CM

## 2023-12-20 DIAGNOSIS — Z6841 Body Mass Index (BMI) 40.0 and over, adult: Secondary | ICD-10-CM

## 2023-12-20 MED ORDER — AMPHETAMINE-DEXTROAMPHETAMINE 20 MG PO TABS: 20.0000 mg | ORAL_TABLET | Freq: Every day | ORAL | 0 refills | Status: DC

## 2023-12-20 MED ORDER — BUSPIRONE HCL 5 MG PO TABS: 5.0000 mg | ORAL_TABLET | Freq: Two times a day (BID) | ORAL | 2 refills | Status: DC

## 2023-12-20 MED ORDER — METFORMIN HCL 500 MG PO TABS: 500.0000 mg | ORAL_TABLET | Freq: Two times a day (BID) | ORAL | 3 refills | Status: DC

## 2023-12-20 NOTE — Progress Notes (Signed)
 BP 126/85 (BP Location: Left Arm, Patient Position: Sitting, Cuff Size: Large)   Pulse 88   Temp 98.4 F (36.9 C) (Oral)   Resp 16   Ht 5' 2.01 (1.575 m)   Wt 249 lb 6.4 oz (113.1 kg)   LMP 12/01/2023 (Exact Date)   SpO2 99%   BMI 45.60 kg/m    Subjective:    Patient ID: [name], female    DOB: Jan 26, 1986, 38 y.o.   MRN: [id number]  HPI: [name] is a 38 y.o. female  Chief Complaint  Patient presents with   ADD    Helping her stay focused no concerns.    PCOS     No current concerns.    Anxiety    Currently struggling. Work stress and home stress particularly bad.    Recently seen by eye doctor due to ongoing intermittent episodes of painful eyes.  He has recommend autoimmune work-up per patient.  ADHD FOLLOW UP Taking Adderall 20 MG daily, which offers her benefit, mood and focus at work and school.  PDMP last fill was 12/14/23. ADHD status: controlled Satisfied with current therapy: yes Medication compliance:  good compliance Controlled substance contract: yes Previous psychiatry evaluation: no Previous medications: yes adderall and adderall XR   Taking meds on weekends/vacations: yes Work/school performance:  good Difficulty sustaining attention/completing tasks: no Distracted by extraneous stimuli: no Does not listen when spoken to: no  Fidgets with hands or feet: no Unable to stay in seat: no Blurts out/interrupts others: no ADHD Medication Side Effects: no    Decreased appetite: no    Headache: no    Sleeping disturbance pattern: no    Irritability: no    Rebound effects (worse than baseline) off medication: no    Anxiousness: no    Dizziness: no    Tics: no   PCOS Previously took Metformin  without much benefit.  Wegovy  worked best for her, but insurance stopped covering. Symptoms have significantly worsened since coming off Wegovy . HbA1C:  Lab Results  Component Value Date   HGBA1C 4.9 12/18/2023  Duration of elevated blood  sugar: chronic Polydipsia: no Polyuria: no Weight change: no Visual disturbance: no Glucose Monitoring: no    Accucheck frequency: Not Checking    Fasting glucose:     Post prandial:  Diabetic Education: Not Completed  ANXIETY/STRESS Continues Effexor  XR 75 MG for anxiety, is tolerating this well.  Mood is exacerbated due to PCOS.  While taking Wegovy  in past her symptoms were well controlled, including mood. Duration: stable Anxious mood: exacerbated due to PCOS Excessive worrying: as above Irritability: as above Sweating: no Nausea: no Palpitations:no Hyperventilation: no Panic attacks: improving Agoraphobia: no  Obscessions/compulsions: no Depressed mood: as above    12/20/2023    7:45 PM 06/18/2023    1:14 PM 08/21/2022    1:04 PM 02/19/2022    3:42 PM 12/05/2021    8:33 AM  Depression screen PHQ 2/9  Decreased Interest 1 1 0 1 2  Down, Depressed, Hopeless 0 0 0 1 2  PHQ - 2 Score 1 1 0 2 4  Altered sleeping 1 2 3 2 2   Tired, decreased energy 2 2 3 1 2   Change in appetite 2 0 0 1 2  Feeling bad or failure about yourself  2 0 0 1 3  Trouble concentrating 1 1 3 1 2   Moving slowly or fidgety/restless 0 0 0 0 1  Suicidal thoughts 0 0 0 0 0  PHQ-9  Score 9 6 9 8 16   Difficult doing work/chores Somewhat difficult Not difficult at all Somewhat difficult  Somewhat difficult  Anhedonia: no Weight changes: no Insomnia: yes hard to fall asleep  Hypersomnia: no Fatigue/loss of energy: no Feelings of worthlessness: no Feelings of guilt: no Impaired concentration/indecisiveness: no Suicidal ideations: no  Crying spells: no Recent Stressors/Life Changes: yes   Relationship problems: no   Family stress: yes     Financial stress: yes    Job stress: no    Recent death/loss: no     12-26-23    7:46 PM 06/18/2023    1:14 PM 08/21/2022    1:06 PM 02/19/2022    3:42 PM  GAD 7 : Generalized Anxiety Score  Nervous, Anxious, on Edge 2 0 1 1  Control/stop worrying 2 0 1 1   Worry too much - different things 2 1 1 1   Trouble relaxing 1 1 3 2   Restless 1 0 1 1  Easily annoyed or irritable 2 1 3 2   Afraid - awful might happen 1 0 0 0  Total GAD 7 Score 11 3 10 8   Anxiety Difficulty Somewhat difficult Not difficult at all Somewhat difficult Somewhat difficult   Relevant past medical, surgical, family and social history reviewed and updated as indicated. Interim medical history since our last visit reviewed. Allergies and medications reviewed and updated.  Review of Systems  Constitutional:  Negative for activity change, appetite change, diaphoresis, fatigue and fever.  Respiratory:  Negative for cough, chest tightness and shortness of breath.   Cardiovascular:  Negative for chest pain, palpitations and leg swelling.  Gastrointestinal: Negative.   Neurological: Negative.   Psychiatric/Behavioral:  Positive for decreased concentration and sleep disturbance. Negative for self-injury and suicidal ideas. The patient is nervous/anxious.    Per HPI unless specifically indicated above     Objective:    BP 126/85 (BP Location: Left Arm, Patient Position: Sitting, Cuff Size: Large)   Pulse 88   Temp 98.4 F (36.9 C) (Oral)   Resp 16   Ht 5' 2.01 (1.575 m)   Wt 249 lb 6.4 oz (113.1 kg)   LMP 12/01/2023 (Exact Date)   SpO2 99%   BMI 45.60 kg/m   Wt Readings from Last 3 Encounters:  12-26-23 249 lb 6.4 oz (113.1 kg)  08/30/23 230 lb (104.3 kg)  08/26/23 247 lb (112 kg)    Physical Exam Vitals and nursing note reviewed.  Constitutional:      General: She is awake. She is not in acute distress.    Appearance: She is well-developed and well-groomed. She is obese. She is not ill-appearing or toxic-appearing.  HENT:     Head: Normocephalic.     Right Ear: Hearing normal.     Left Ear: Hearing normal.     Nose: Nose normal.     Mouth/Throat:     Mouth: Mucous membranes are moist.  Eyes:     General: Lids are normal.        Right eye: No discharge.         Left eye: No discharge.     Conjunctiva/sclera: Conjunctivae normal.     Pupils: Pupils are equal, round, and reactive to light.  Neck:     Thyroid : No thyromegaly.     Vascular: No carotid bruit or JVD.  Cardiovascular:     Rate and Rhythm: Normal rate and regular rhythm.     Heart sounds: Normal heart sounds. No murmur heard.  No gallop.  Pulmonary:     Effort: Pulmonary effort is normal.     Breath sounds: Normal breath sounds.  Abdominal:     General: Bowel sounds are normal.     Palpations: Abdomen is soft.  Musculoskeletal:     Cervical back: Normal range of motion and neck supple.     Right lower leg: No edema.     Left lower leg: No edema.  Lymphadenopathy:     Cervical: No cervical adenopathy.  Skin:    General: Skin is warm and dry.  Neurological:     Mental Status: She is alert and oriented to person, place, and time.  Psychiatric:        Attention and Perception: Attention normal.        Mood and Affect: Mood normal.        Behavior: Behavior normal. Behavior is cooperative.        Thought Content: Thought content normal.        Judgment: Judgment normal.    Results for orders placed or performed in visit on 12/18/23  FSH/LH   Collection Time: 12/18/23  8:56 AM  Result Value Ref Range   LH 3.4 mIU/mL   FSH 4.1 mIU/mL  VITAMIN D  25 Hydroxy (Vit-D Deficiency, Fractures)   Collection Time: 12/18/23  8:56 AM  Result Value Ref Range   Vit D, 25-Hydroxy 37.6 30.0 - 100.0 ng/mL  HgB A1c   Collection Time: 12/18/23  8:56 AM  Result Value Ref Range   Hgb A1c MFr Bld 4.9 4.8 - 5.6 %   Est. average glucose Bld gHb Est-mCnc 94 mg/dL  TSH   Collection Time: 12/18/23  8:56 AM  Result Value Ref Range   TSH 1.970 0.450 - 4.500 uIU/mL  Lipid Panel w/o Chol/HDL Ratio   Collection Time: 12/18/23  8:56 AM  Result Value Ref Range   Cholesterol, Total 195 100 - 199 mg/dL   Triglycerides 66 0 - 149 mg/dL   HDL 70 >60 mg/dL   VLDL Cholesterol Cal 12 5 - 40 mg/dL    LDL Chol Calc (NIH) 113 (H) 0 - 99 mg/dL  Comprehensive metabolic panel with GFR   Collection Time: 12/18/23  8:56 AM  Result Value Ref Range   Glucose 106 (H) 70 - 99 mg/dL   BUN 9 6 - 20 mg/dL   Creatinine, Ser 9.11 0.57 - 1.00 mg/dL   eGFR 86 >40 fO/fpw/8.26   BUN/Creatinine Ratio 10 9 - 23   Sodium 138 134 - 144 mmol/L   Potassium 4.5 3.5 - 5.2 mmol/L   Chloride 100 96 - 106 mmol/L   CO2 20 20 - 29 mmol/L   Calcium 9.7 8.7 - 10.2 mg/dL   Total Protein 7.2 6.0 - 8.5 g/dL   Albumin 4.6 3.9 - 4.9 g/dL   Globulin, Total 2.6 1.5 - 4.5 g/dL   Bilirubin Total 0.5 0.0 - 1.2 mg/dL   Alkaline Phosphatase 115 44 - 121 IU/L   AST 20 0 - 40 IU/L   ALT 20 0 - 32 IU/L  CBC with Differential/Platelet   Collection Time: 12/18/23  8:56 AM  Result Value Ref Range   WBC 5.4 3.4 - 10.8 x10E3/uL   RBC 4.87 3.77 - 5.28 x10E6/uL   Hemoglobin 15.0 11.1 - 15.9 g/dL   Hematocrit 53.2 (H) 65.9 - 46.6 %   MCV 96 79 - 97 fL   MCH 30.8 26.6 - 33.0 pg   MCHC 32.1 31.5 - 35.7  g/dL   RDW 87.9 88.2 - 84.5 %   Platelets 390 150 - 450 x10E3/uL   Neutrophils 52 Not Estab. %   Lymphs 39 Not Estab. %   Monocytes 6 Not Estab. %   Eos 2 Not Estab. %   Basos 1 Not Estab. %   Neutrophils Absolute 2.9 1.4 - 7.0 x10E3/uL   Lymphocytes Absolute 2.1 0.7 - 3.1 x10E3/uL   Monocytes Absolute 0.3 0.1 - 0.9 x10E3/uL   EOS (ABSOLUTE) 0.1 0.0 - 0.4 x10E3/uL   Basophils Absolute 0.0 0.0 - 0.2 x10E3/uL   Immature Granulocytes 0 Not Estab. %   Immature Grans (Abs) 0.0 0.0 - 0.1 x10E3/uL      Assessment & Plan:   Problem List Items Addressed This Visit       Endocrine   PCOS (polycystic ovarian syndrome) - Primary   Chronic, ongoing, taking Spironolactone . Was taking Wegovy , but insurance stopped coverage of this, it was very beneficial to her PCOS and now noticing worsening symptoms.  Will send in Metformin  to restart, it offered some benefit in past, but not 100%.  Referral to weight management.  She will look  into compounded Wegovy  and alert provider if wishes a script sent somewhere.        Other   Situational anxiety   Chronic, stable.  Denies SI/HI.  Continue Effexor  XR 75 MG daily dosing and adjust further as needed.  Recommend meditation and relaxation exercises at home.   Will add on Buspar  5 MG to take twice a day scheduled or as needed. Educated her on this regimen.      Relevant Medications   busPIRone  (BUSPAR ) 5 MG tablet   Obesity   BMI 45.60 with PCOS and OSA. Had lost 74 pounds with Wegovy  2.4 MG, but insurance stopped covering many months back. Her symptoms have now exacerbated. Wegovy  offered benefit to PCOS and overall health. Tried Topamax  with no benefit to weight loss and side effects presented. Will restart Metformin  for weight loss and PCOS, offered some benefit in past but not to level GLP1 did.  Referral to weight management placed.  Recommended eating smaller high protein, low fat meals more frequently and exercising 30 mins a day 5 times a week with a goal of 10-15lb weight loss in the next 3 months. Patient voiced their understanding and motivation to adhere to these recommendations.         Relevant Medications   metFORMIN  (GLUCOPHAGE ) 500 MG tablet   amphetamine -dextroamphetamine  (ADDERALL) 20 MG tablet (Start on 02/13/2024)   amphetamine -dextroamphetamine  (ADDERALL) 20 MG tablet (Start on 03/13/2024)   amphetamine -dextroamphetamine  (ADDERALL) 20 MG tablet (Start on 01/13/2024)   ADHD (attention deficit hyperactivity disorder)   Ongoing, started in childhood.  Will continue Adderall 20 MG daily, tolerating this.  In past did not tolerate Ritalin or Concerta per her report, will avoid these.  UDS due next 06/07/24 and contract up to date.  She is aware of need for every 3 month visits and yearly UDS, agrees with this plan.  Predated refills sent for the next 2 months.      Other Visit Diagnoses       Dry eye       Will check some autoimmune labs per eye doctor  recommendation.   Relevant Orders   ANA 12 Plus Profile (RDL)   Sjogrens syndrome-B extractable nuclear antibody   Sed Rate (ESR)   C-reactive protein          Follow up plan: Return  in about 3 months (around 03/20/2024) for ADD and PCOS + ANXIETY.

## 2023-12-20 NOTE — Assessment & Plan Note (Signed)
 Ongoing, started in childhood.  Will continue Adderall 20 MG daily, tolerating this.  In past did not tolerate Ritalin or Concerta per her report, will avoid these.  UDS due next 06/07/24 and contract up to date.  She is aware of need for every 3 month visits and yearly UDS, agrees with this plan.  Predated refills sent for the next 2 months.

## 2023-12-20 NOTE — Assessment & Plan Note (Signed)
 Chronic, stable.  Denies SI/HI.  Continue Effexor  XR 75 MG daily dosing and adjust further as needed.  Recommend meditation and relaxation exercises at home.   Will add on Buspar  5 MG to take twice a day scheduled or as needed. Educated her on this regimen.

## 2023-12-20 NOTE — Assessment & Plan Note (Signed)
 Chronic, ongoing, taking Spironolactone . Was taking Wegovy , but insurance stopped coverage of this, it was very beneficial to her PCOS and now noticing worsening symptoms.  Will send in Metformin  to restart, it offered some benefit in past, but not 100%.  Referral to weight management.  She will look into compounded Wegovy  and alert provider if wishes a script sent somewhere.

## 2023-12-20 NOTE — Assessment & Plan Note (Signed)
 BMI 45.60 with PCOS and OSA. Had lost 74 pounds with Wegovy  2.4 MG, but insurance stopped covering many months back. Her symptoms have now exacerbated. Wegovy  offered benefit to PCOS and overall health. Tried Topamax  with no benefit to weight loss and side effects presented. Will restart Metformin  for weight loss and PCOS, offered some benefit in past but not to level GLP1 did.  Referral to weight management placed.  Recommended eating smaller high protein, low fat meals more frequently and exercising 30 mins a day 5 times a week with a goal of 10-15lb weight loss in the next 3 months. Patient voiced their understanding and motivation to adhere to these recommendations.

## 2023-12-24 ENCOUNTER — Ambulatory Visit: Payer: Self-pay | Admitting: Nurse Practitioner

## 2023-12-24 DIAGNOSIS — H5713 Ocular pain, bilateral: Secondary | ICD-10-CM

## 2023-12-24 DIAGNOSIS — R768 Other specified abnormal immunological findings in serum: Secondary | ICD-10-CM

## 2023-12-24 NOTE — Progress Notes (Signed)
 Contacted via MyChart  Waiting on one more lab, but current levels are normal.  Any questions?

## 2023-12-27 NOTE — Progress Notes (Signed)
 Contacted via MyChart  ANA did return positive, however speckled pattern is 1:40, which rheumatology often will not see unless higher than this as this could mean it is not a true positive.  We ill see what remainder shows once all is returned. Sjogren test was normal.

## 2024-01-02 LAB — SEDIMENTATION RATE: Sed Rate: 9 mm/h (ref 0–32)

## 2024-01-02 LAB — ANA 12 PLUS PROFILE, POSITIVE
Anti-CCP Ab, IgG & IgA (RDL): <20 U (ref <20)
Anti-Cardiolipin Ab, IgA (RDL): <12 U/mL (ref <12)
Anti-Cardiolipin Ab, IgG (RDL): <15 GPL U/mL (ref <15)
Anti-Cardiolipin Ab, IgM (RDL): <13 [MPL'U]/mL (ref <13)
Anti-Centromere Ab (RDL): <1:40 {titer}
Anti-Chromatin Ab, IgG (RDL): <20 U (ref <20)
Anti-La (SS-B) Ab (RDL): <20 U (ref <20)
Anti-Ro (SS-A) Ab (RDL): <20 U (ref <20)
Anti-Scl-70 Ab (RDL): <20 U (ref <20)
Anti-Sm Ab (RDL): <20 U (ref <20)
Anti-TPO Ab (RDL): <9 [IU]/mL (ref <9.0)
Anti-U1 RNP Ab (RDL): <20 U (ref <20)
Anti-dsDNA Ab by Farr(RDL): <8 [IU]/mL (ref <8.0)
C3 Complement (RDL): 200 mg/dL — ABNORMAL HIGH (ref 90–180)
C4 Complement (RDL): 28 mg/dL (ref 10–40)
Rheumatoid Factor by Turb RDL: <14 [IU]/mL (ref <14)
Speckled Pattern: 1:40 {titer} — ABNORMAL HIGH

## 2024-01-02 LAB — C-REACTIVE PROTEIN: CRP: 3 mg/L (ref 0–10)

## 2024-01-02 LAB — SJOGRENS SYNDROME-B EXTRACTABLE NUCLEAR ANTIBODY: ENA SSB (LA) Ab: <0.2 AI (ref 0.0–0.9)

## 2024-01-02 LAB — ANA 12 PLUS PROFILE (RDL): Anti-Nuclear Ab by IFA (RDL): POSITIVE — AB

## 2024-01-02 NOTE — Progress Notes (Signed)
 Contacted via MyChart  Good afternoon [name], remainder of of your labs have returned. I would recommend we place a referral to rheumatology to see if levels leaning towards and autoimmune process.  Your ANA was positive as I wrote before but low ratio.  C3 complement if elevated too.  Thoughts? I would recommend [facility] group.

## 2024-01-07 ENCOUNTER — Ambulatory Visit: Admission: RE | Admit: 2024-01-07 | Discharge: 2024-01-07 | Disposition: A | Source: Ambulatory Visit

## 2024-01-07 ENCOUNTER — Ambulatory Visit

## 2024-01-07 VITALS — BP 127/85 | HR 76 | Temp 97.8°F | Resp 13 | Ht 62.5 in | Wt 250.0 lb

## 2024-01-07 DIAGNOSIS — S83011A Lateral subluxation of right patella, initial encounter: Secondary | ICD-10-CM | POA: Diagnosis not present

## 2024-01-07 DIAGNOSIS — R202 Paresthesia of skin: Secondary | ICD-10-CM | POA: Diagnosis not present

## 2024-01-07 DIAGNOSIS — R5383 Other fatigue: Secondary | ICD-10-CM

## 2024-01-07 DIAGNOSIS — M25561 Pain in right knee: Secondary | ICD-10-CM

## 2024-01-07 DIAGNOSIS — D518 Other vitamin B12 deficiency anemias: Secondary | ICD-10-CM | POA: Diagnosis not present

## 2024-01-07 DIAGNOSIS — R7689 Other specified abnormal immunological findings in serum: Secondary | ICD-10-CM

## 2024-01-07 DIAGNOSIS — E538 Deficiency of other specified B group vitamins: Secondary | ICD-10-CM

## 2024-01-07 DIAGNOSIS — M25562 Pain in left knee: Secondary | ICD-10-CM

## 2024-01-07 DIAGNOSIS — R768 Other specified abnormal immunological findings in serum: Secondary | ICD-10-CM | POA: Diagnosis not present

## 2024-01-07 DIAGNOSIS — S83012A Lateral subluxation of left patella, initial encounter: Secondary | ICD-10-CM | POA: Diagnosis not present

## 2024-01-07 LAB — VITAMIN B12: Vitamin B-12: 452 pg/mL (ref 200–1100)

## 2024-01-07 NOTE — Progress Notes (Signed)
 Office Visit Note  Patient: [name]             Date of Birth: Sep 11, 1985           MRN: [id number]             PCP: Valerio Melanie DASEN, NP Referring: Valerio Melanie DASEN, NP Visit Date: 01/07/2024 Occupation: Data Unavailable  Subjective:  New Patient (Initial Visit) (Abnormal Labs and joint pain. )   History of Present Illness: [name] is a 38 y.o. female for positive ANA.  Patient states that she has been very fatigued, and had discussed it at her physical she had scheduled. She also admits to mood changes and insomnia.  She admits to joint pain in lower legs from knee down (points to knees and shins). She admits to constant pain, present all day long. She admits to stiffness that is worse in the morning and if she has been sitting for extended periods of time. She admits to AM stiffness lasting 15-20 minutes. She admits to improvement with hot shower (may last longer without this). She admits to occasional numbness in her right hand, wakes up with it numb. She denies any swelling of her knees. She denies any change with movement/activity. She states the symptoms are worse when she squats for >10 minutes.  She admits to new dry eyes. She denies any new medications. She denies any infections around the time this started.  She denies oral ulcers. She denies alopecia. She denies rashes. She denies photosensitivity. She denies raynaud's. She denies fevers. She denies lymphadenopathy. She denies hx of miscarriage.   Maternal grandmother with RA and maternal aunt and paternal uncle with MS  Activities of Daily Living:  Patient reports morning stiffness for 10-15 minutes.   Patient Reports nocturnal pain.  Difficulty dressing/grooming: Denies Difficulty climbing stairs: Denies Difficulty getting out of chair: Denies Difficulty using hands for taps, buttons, cutlery, and/or writing: Denies  Review of Systems  Constitutional:  Positive for fatigue.  HENT:   Positive for mouth dryness. Negative for mouth sores.   Eyes:  Positive for dryness.  Respiratory:  Negative for shortness of breath.   Cardiovascular:  Negative for chest pain and palpitations.  Gastrointestinal:  Negative for blood in stool, constipation and diarrhea.  Endocrine: Positive for increased urination.  Genitourinary:  Negative for involuntary urination.  Musculoskeletal:  Positive for joint pain, joint pain, joint swelling, morning stiffness and muscle tenderness. Negative for gait problem, myalgias, muscle weakness and myalgias.  Skin:  Positive for color change and hair loss. Negative for rash and sensitivity to sunlight.  Allergic/Immunologic: Negative for susceptible to infections.  Neurological:  Positive for headaches. Negative for dizziness.  Hematological:  Negative for swollen glands.  Psychiatric/Behavioral:  Positive for depressed mood and sleep disturbance. The patient is not nervous/anxious.     PMFS History:  Patient Active Problem List   Diagnosis Date Noted   History of kidney stones 08/26/2023   Flank pain 08/26/2023   Situational anxiety 12/05/2021   Controlled substance agreement signed 12/02/2021   Vitamin D  deficiency 05/14/2021   ADHD (attention deficit hyperactivity disorder) 02/17/2021   Obstructive sleep apnea of adult 11/02/2020   Elevated TSH 08/30/2020   Obesity 07/01/2020   PCOS (polycystic ovarian syndrome) 01/07/2015   Hirsutism 12/15/2014   Cervical disc disorder with radiculopathy of cervical region 12/15/2014   Herpes simplex virus (HSV) infection 12/02/2006   CYSTITIS, CHRONIC INTERSTITIAL 11/21/2006    Past Medical History:  Diagnosis  Date   ADHD (attention deficit hyperactivity disorder)    Allergy    Anxiety    Blood transfusion as the cause of abnormal reaction of patient, or of later complication, without misadventure at the time of procedure    lost to much blood during delivery   Depression    Interstitial cystitis     Kidney stones    PCOS (polycystic ovarian syndrome)    Shingles    Sleep apnea     Family History  Problem Relation Age of Onset   Hyperlipidemia Mother    Hypertension Mother    Hypertension Father    Hyperlipidemia Father    Hypertension Sister    Hyperlipidemia Sister    Hyperlipidemia Maternal Grandfather    Hypertension Maternal Grandfather    Hypertension Paternal Grandmother    Hyperlipidemia Paternal Grandmother    Hyperlipidemia Paternal Grandfather    Hypertension Paternal Grandfather    Past Surgical History:  Procedure Laterality Date   CESAREAN SECTION     CHOLECYSTECTOMY  10/12/2021   interstitial cystitis laparoscopy     KIDNEY STONE SURGERY     basket, lithotripsy   KNEE ARTHROSCOPY Right    TONSILLECTOMY AND ADENOIDECTOMY     TUBAL LIGATION     Social History   Tobacco Use   Smoking status: Never    Passive exposure: Never   Smokeless tobacco: Never  Vaping Use   Vaping status: Never Used  Substance Use Topics   Alcohol use: Yes    Comment: on occasion   Drug use: No   Social History   Social History Narrative   Not on file     Immunization History  Administered Date(s) Administered   Influenza, Seasonal, Injecte, Preservative Fre 01/24/2023   Influenza-Unspecified 02/03/2021, 01/29/2022   Td 02/14/2000, 06/01/2008   Tdap 07/01/2020     Objective: Vital Signs: BP 127/85 (BP Location: Right Arm, Patient Position: Sitting, Cuff Size: Large)   Pulse 76   Temp 97.8 F (36.6 C)   Resp 13   Ht 5' 2.5 (1.588 m)   Wt 250 lb (113.4 kg)   LMP 12/26/2023 (Exact Date)   BMI 45.00 kg/m    Physical Exam Vitals and nursing note reviewed.  HENT:     Head: Normocephalic and atraumatic.     Nose: Nose normal.  Eyes:     Conjunctiva/sclera: Conjunctivae normal.     Pupils: Pupils are equal, round, and reactive to light.  Cardiovascular:     Rate and Rhythm: Normal rate and regular rhythm.     Heart sounds: Normal heart sounds.   Pulmonary:     Effort: Pulmonary effort is normal.     Breath sounds: Normal breath sounds.  Skin:    General: Skin is warm and dry.  Neurological:     Mental Status: She is alert. Mental status is at baseline.  Psychiatric:        Mood and Affect: Mood normal.        Behavior: Behavior normal.      Musculoskeletal Exam:   CDAI Exam: CDAI Score: -- Patient Global: --; Provider Global: -- Swollen: 0 ; Tender: 0  Joint Exam 01/07/2024   All documented joints were normal     Investigation: No additional findings.  Imaging: No results found.  Recent Labs: Lab Results  Component Value Date   WBC 5.4 12/18/2023   HGB 15.0 12/18/2023   PLT 390 12/18/2023   NA 138 12/18/2023   K 4.5 12/18/2023  CL 100 12/18/2023   CO2 20 12/18/2023   GLUCOSE 106 (H) 12/18/2023   BUN 9 12/18/2023   CREATININE 0.88 12/18/2023   BILITOT 0.5 12/18/2023   ALKPHOS 115 12/18/2023   AST 20 12/18/2023   ALT 20 12/18/2023   PROT 7.2 12/18/2023   ALBUMIN 4.6 12/18/2023   CALCIUM 9.7 12/18/2023   GFRAA >60 09/01/2018    Speciality Comments: No specialty comments available.  Procedures:  No procedures performed Allergies: Latex and Penicillins   Assessment / Plan:     Visit Diagnoses:  Positive ANA (antinuclear antibody) Patient with low-titer positive ANA. Patient does not have any features of SLE (no objective malar rash, inflammatory arthritis, Raynaud's, alopecia, recurrent cytopenias, negative dsDNA, smith), rheumatoid arthritis (no evidence of synovitis, negative RF/CCP), Sjogren's disease (negative SSA and SSB), or scleroderma (no sclerodactyly, no Raynaud's)  As discussed with patient, a positive ANA need not represent presence of clinically active systemic autoimmune disease.  Can be positive in the normal population, autoimmune thyroid  disease (Graves' disease, Hashimoto's thyroiditis etc.), or infection. ANA can be positive in the healthy population at the following rates:  ANA 1:40: 20%-30%, ANA 1:80: 10%-15%, ANA 1:160: 5%, ANA 1:320: 3% positive, healthy relative of an SLE patient: 5%-25% positive (usually low titers), and elderly (age >70 years): up to 70% positive at ANA titer 1:40.   Fatigue Numbness and tingling Hx of B12 deficiency Patient with symptoms of fatigue, unclear etiology at this time. Given this and her carpel tunnel symptoms, will check Vitamin B12 today.  Acute bilateral knee pain Patient with bilateral knee and shin pain which I suspect is non-inflammatory in nature, especially given involvement of muscles/anterior shin in her symptoms. Will obtain DG Knee 3 Views Left and DG Knee 3 Views Right today. Will likely recommend physical therapy or referral to orthopedics pending results.  Orders: Orders Placed This Encounter  Procedures   DG Knee 3 Views Left   DG Knee 3 Views Right   Vitamin B12   No orders of the defined types were placed in this encounter.  I personally spent a total of 45 minutes in the care of the patient today including preparing to see the patient, getting/reviewing separately obtained history, performing a medically appropriate exam/evaluation, counseling and educating, placing orders, and documenting clinical information in the EHR.   Follow-Up Instructions: Return if symptoms worsen or fail to improve.   Asberry Claw, DO

## 2024-01-09 ENCOUNTER — Encounter (INDEPENDENT_AMBULATORY_CARE_PROVIDER_SITE_OTHER): Payer: Self-pay

## 2024-01-09 ENCOUNTER — Ambulatory Visit: Payer: Self-pay

## 2024-01-21 ENCOUNTER — Encounter: Payer: Self-pay | Admitting: Physician Assistant

## 2024-01-21 ENCOUNTER — Ambulatory Visit (INDEPENDENT_AMBULATORY_CARE_PROVIDER_SITE_OTHER): Admitting: Physician Assistant

## 2024-01-21 VITALS — BP 118/86 | Ht 62.5 in | Wt 255.0 lb

## 2024-01-21 DIAGNOSIS — M7711 Lateral epicondylitis, right elbow: Secondary | ICD-10-CM | POA: Diagnosis not present

## 2024-01-21 MED ORDER — METHYLPREDNISOLONE 4 MG PO TBPK: ORAL_TABLET | ORAL | 0 refills | Status: DC

## 2024-01-21 NOTE — Progress Notes (Signed)
 Referring Physician:  Valerio Melanie DASEN, NP [address] [place],  [place] [address]  Primary Physician:  Valerio Melanie DASEN, NP  History of Present Illness: 01/21/2024 Ms. [name] is here today with a chief complaint of right elbow pain x 1 month.  She was starting when helping her husband more remodeling the house.  She notices that when she closes her fist and tries straightening her elbow she starts having intense pain on the outside of her elbow.  At times it will radiate to the top of her hand into her 2nd and 3rd digit.  She also notices it when she is typing at her desk if her elbow is laying on the table.  She denies any neck pain.  She denies any pain radiating from her neck down her hands.  Denies any medial elbow pain.  Acknowledges some numbness in her right hand upon waking in the morning.      Weakness: none Bowel/Bladder Dysfunction: none  Conservative measures:    Powell Rumalda Lesches has no symptoms of cervical myelopathy.  The symptoms are causing a significant impact on the patient's life.   Review of Systems:  A 10 point review of systems is negative, except for the pertinent positives and negatives detailed in the HPI.  Past Medical History: Past Medical History:  Diagnosis Date   ADHD (attention deficit hyperactivity disorder)    Allergy    Anxiety    Blood transfusion as the cause of abnormal reaction of patient, or of later complication, without misadventure at the time of procedure    lost to much blood during delivery   Depression    Interstitial cystitis    Kidney stones    PCOS (polycystic ovarian syndrome)    Shingles    Sleep apnea     Past Surgical History: Past Surgical History:  Procedure Laterality Date   CESAREAN SECTION     CHOLECYSTECTOMY  10/12/2021   interstitial cystitis laparoscopy     KIDNEY STONE SURGERY     basket, lithotripsy   KNEE ARTHROSCOPY Right    TONSILLECTOMY AND ADENOIDECTOMY     TUBAL LIGATION       Allergies: Allergies as of 01/21/2024 - Review Complete 01/21/2024  Allergen Reaction Noted   Latex Anaphylaxis 10/30/2006   Penicillins Nausea And Vomiting 03/17/2018    Medications: Outpatient Encounter Medications as of 01/21/2024  Medication Sig   [START ON 02/13/2024] amphetamine -dextroamphetamine  (ADDERALL) 20 MG tablet Take 1 tablet (20 mg total) by mouth daily.   [START ON 03/13/2024] amphetamine -dextroamphetamine  (ADDERALL) 20 MG tablet Take 1 tablet (20 mg total) by mouth daily.   amphetamine -dextroamphetamine  (ADDERALL) 20 MG tablet Take 1 tablet (20 mg total) by mouth daily.   cetirizine (ZYRTEC) 5 MG tablet Take 5 mg by mouth daily.   esomeprazole (NEXIUM) 20 MG capsule Take 20 mg by mouth daily at 12 noon.   methylPREDNISolone (MEDROL DOSEPAK) 4 MG TBPK tablet Take by mouth daily, taper daily dose per package instructions.   spironolactone  (ALDACTONE ) 100 MG tablet TAKE ONE TABLET BY MOUTH TWICE DAILY   valACYclovir  (VALTREX ) 1000 MG tablet Take 1 tablet (1,000 mg total) by mouth 2 (two) times daily for 10 days, then stop.  May repeat as needed for flares. (Patient taking differently: as needed. Take 1 tablet (1,000 mg total) by mouth 2 (two) times daily for 10 days, then stop.  May repeat as needed for flares.)   venlafaxine  XR (EFFEXOR  XR) 75 MG 24 hr capsule Take  1 capsule (75 mg total) by mouth daily with breakfast.   [DISCONTINUED] busPIRone  (BUSPAR ) 5 MG tablet Take 1 tablet (5 mg total) by mouth 2 (two) times daily.   [DISCONTINUED] chlorhexidine  (PERIDEX ) 0.12 % solution SMARTSIG:By Mouth   [DISCONTINUED] Cholecalciferol  1.25 MG (50000 UT) TABS Take 1 tablet by mouth once a week. (Patient not taking: Reported on 01/07/2024)   [DISCONTINUED] cyclobenzaprine  (FLEXERIL ) 10 MG tablet Take 1 tablet (10 mg total) by mouth 3 (three) times daily as needed for muscle spasms.   [DISCONTINUED] metFORMIN  (GLUCOPHAGE ) 500 MG tablet Take 1 tablet (500 mg total) by mouth 2 (two)  times daily with a meal.   [DISCONTINUED] ondansetron  (ZOFRAN -ODT) 4 MG disintegrating tablet Take 1 tablet (4 mg total) by mouth every 8 (eight) hours as needed   [DISCONTINUED] tamsulosin  (FLOMAX ) 0.4 MG CAPS capsule Take 1 capsule (0.4 mg total) by mouth daily.   No facility-administered encounter medications on file as of 01/21/2024.    Social History: Social History   Tobacco Use   Smoking status: Never    Passive exposure: Never   Smokeless tobacco: Never  Vaping Use   Vaping status: Never Used  Substance Use Topics   Alcohol use: Yes    Comment: on occasion   Drug use: No    Family Medical History: Family History  Problem Relation Age of Onset   Hyperlipidemia Mother    Hypertension Mother    Hypertension Father    Hyperlipidemia Father    Hypertension Sister    Hyperlipidemia Sister    Hyperlipidemia Maternal Grandfather    Hypertension Maternal Grandfather    Hypertension Paternal Grandmother    Hyperlipidemia Paternal Grandmother    Hyperlipidemia Paternal Grandfather    Hypertension Paternal Grandfather     Physical Examination: @VITALWITHPAIN @  General: Patient is well developed, well nourished, calm, collected, and in no apparent distress. Attention to examination is appropriate.  Psychiatric: Patient is non-anxious.  Head:  Pupils equal, round, and reactive to light.  ENT:  Oral mucosa appears well hydrated.  Neck:   Supple.  Full range of motion.  Respiratory: Patient is breathing without any difficulty.  Extremities: No edema.  Vascular: Palpable dorsal pedal pulses.  Skin:   On exposed skin, there are no abnormal skin lesions.  NEUROLOGICAL:     Awake, alert, oriented to person, place, and time.  Speech is clear and fluent. Fund of knowledge is appropriate.   Cranial Nerves: Pupils equal round and reactive to light.  Facial tone is symmetric.   Patient has tenderness to her lateral epicondyle and the proximal extensor  compartment.  She does also have a positive carpal compression test on the right.  No Tinel of right ulnar nerve.  Pain with finger extension.    Strength: Side Biceps Triceps Deltoid Interossei Grip Wrist Ext. Wrist Flex.  R 5 5 5 5 5 5 5   L 5 5 5 5 5 5 5    Side Iliopsoas Quads Hamstring PF DF EHL  R 5 5 5 5 5 5   L 5 5 5 5 5 5    Bilateral upper and lower extremity sensation is intact to light touch.    Gait is normal.   No difficulty with tandem gait.   No evidence of dysmetria noted.  Medical Decision Making  Imaging: No new imaging to review.  Assessment and Plan: [name] is a pleasant 38 y.o. female with likely right lateral epicondylitis versus radial tunnel syndrome.  I have recommended the following:   -  Medrol Dosepak.  Once complete, may resume NSAIDs - Elbow brace and reduction in repetitive movements with rest and ice. - I have reached out to orthopedics who would not recommend an injection at this time. - Follow-up as needed.  I do think patient likely has carpal tunnel syndrome at baseline.  If she would like a further workup for this happy to order EMG and go from there.  Thank you for involving me in the care of this patient.     Lyle Decamp, PA-C Dept. of Neurosurgery

## 2024-02-17 ENCOUNTER — Other Ambulatory Visit: Payer: Self-pay | Admitting: Nurse Practitioner

## 2024-02-18 NOTE — Telephone Encounter (Signed)
 Requested Prescriptions  Pending Prescriptions Disp Refills   spironolactone  (ALDACTONE ) 100 MG tablet [Pharmacy Med Name: SPIRONOLACTONE  100 MG TAB] 180 tablet 1    Sig: TAKE ONE TABLET BY MOUTH TWICE DAILY     Cardiovascular: Diuretics - Aldosterone Antagonist Passed - 02/18/2024  3:03 PM      Passed - Cr in normal range and within 180 days    Creatinine  Date Value Ref Range Status  06/18/2023 27.3 20.0 - 300.0 mg/dL Final   Creatinine, Ser  Date Value Ref Range Status  12/18/2023 0.88 0.57 - 1.00 mg/dL Final   Creatinine,U  Date Value Ref Range Status  05/07/2008 232.3 mg/dL Final    Comment:    See lab report for associated comment(s)         Passed - K in normal range and within 180 days    Potassium  Date Value Ref Range Status  12/18/2023 4.5 3.5 - 5.2 mmol/L Final         Passed - Na in normal range and within 180 days    Sodium  Date Value Ref Range Status  12/18/2023 138 134 - 144 mmol/L Final         Passed - eGFR is 30 or above and within 180 days    GFR calc Af Amer  Date Value Ref Range Status  09/01/2018 >60 >60 mL/min Final   GFR calc non Af Amer  Date Value Ref Range Status  09/01/2018 >60 >60 mL/min Final   eGFR  Date Value Ref Range Status  12/18/2023 86 >59 mL/min/1.73 Final         Passed - Last BP in normal range    BP Readings from Last 1 Encounters:  01/21/24 118/86         Passed - Valid encounter within last 6 months    Recent Outpatient Visits           2 months ago PCOS (polycystic ovarian syndrome)   [place] [facility] [place], Melanie T, NP   3 months ago Attention deficit hyperactivity disorder (ADHD), combined type   [place] [facility] [place], Melanie DASEN, NP   5 months ago Flank pain   [place] [facility] [place], Melanie T, NP   8 months ago Attention deficit hyperactivity disorder (ADHD), combined type   [place] [facility] [place], Melanie DASEN, NP

## 2024-03-13 ENCOUNTER — Other Ambulatory Visit: Payer: Self-pay | Admitting: Physician Assistant

## 2024-03-13 DIAGNOSIS — M79601 Pain in right arm: Secondary | ICD-10-CM

## 2024-03-22 NOTE — Patient Instructions (Incomplete)
 Be Involved in Caring For Your Health:  Taking Medications When medications are taken as directed, they can greatly improve your health. But if they are not taken as prescribed, they may not work. In some cases, not taking them correctly can be harmful. To help ensure your treatment remains effective and safe, understand your medications and how to take them. Bring your medications to each visit for review by your provider.  Your lab results, notes, and after visit summary will be available on My Chart. We strongly encourage you to use this feature. If lab results are abnormal the clinic will contact you with the appropriate steps. If the clinic does not contact you assume the results are satisfactory. You can always view your results on My Chart. If you have questions regarding your health or results, please contact the clinic during office hours. You can also ask questions on My Chart.  We at [facility] are grateful that you chose [place] to provide your care. We strive to provide evidence-based and compassionate care and are always looking for feedback. If you get a survey from the clinic please complete this so we can hear your opinions.  Living With Attention Deficit Hyperactivity Disorder If you have been diagnosed with attention deficit hyperactivity disorder (ADHD), you may be relieved that you now know why you have felt or behaved a certain way. Still, you may feel overwhelmed about the treatment ahead. You may also wonder how to get the support you need and how to deal with the condition day-to-day. With treatment and support, you can live with ADHD and manage your symptoms. How to manage lifestyle changes Managing lifestyle changes can be challenging. Seeking support from your healthcare provider, therapist, family, and friends can be helpful. How to recognize changes in your condition The following signs may mean that your treatment is working well and your condition is  improving: Consistently being on time for appointments. Being more organized at home and work. Other people noticing improvements in your behavior. Achieving goals that you set for yourself. Thinking more clearly. The following signs may mean that your treatment is not working very well: Feeling impatience or more confusion. Missing, forgetting, or being late for appointments. An increasing sense of disorganization and messiness. More difficulty in reaching goals that you set for yourself. Loved ones becoming angry or frustrated with you. Follow these instructions at home: Medicines Take over-the-counter and prescription medicines only as told by your health care provider. Check with your health care provider before taking any new medicines. General instructions Create structure and an organized atmosphere at home. For example: Make a list of tasks, then rank them from most important to least important. Work on one task at a time until your listed tasks are done. Make a daily schedule and follow it consistently every day. Use an appointment calendar, and check it 2-3 times a day to keep on track. Keep it with you when you leave the house. Create spaces where you keep certain things, and always put things back in their places after you use them. Keep all follow-up visits. Your health care provider will need to monitor your condition and adjust your treatment over time. Where to find support Talking to others  Keep emotion out of important discussions and speak in a calm, logical way. Listen closely and patiently to your loved ones. Try to understand their point of view, and try to avoid getting defensive. Take responsibility for the consequences of your actions. Ask that others  do not take your behaviors personally. Aim to solve problems as they come up, and express your feelings instead of bottling them up. Talk openly about what you need from your loved ones and how they can support  you. Consider going to family therapy sessions or having your family meet with a specialist who deals with ADHD-related behavior problems. Finances Not all insurance plans cover mental health care, so it is important to check with your insurance carrier. If paying for co-pays or counseling services is a problem, search for a local or county mental health care center. Public mental health care services may be offered there at a low cost or no cost when you are not able to see a private health care provider. If you are taking medicine for ADHD, you may be able to get the generic form, which may be less expensive than brand-name medicine. Some makers of prescription medicines also offer help to patients who cannot afford the medicines that they need. Therapy and support groups Talking with a mental health care provider and participating in support groups can help to improve your quality of life, daily functioning, and overall symptoms. Questions to ask your health care provider: What are the risks and benefits of taking medicines? Would I benefit from therapy? How often should I follow up with a health care provider? Where to find more information Learn more about ADHD from: Children and Adults with Attention Deficit Hyperactivity Disorder: [employer]add[employer] of Mental Health: BloggerCourse.com Centers for Disease Control and Prevention: TonerPromos.no Contact a health care provider if: You have side effects from your medicines, such as: Repeated muscle twitches, coughing, or speech outbursts. Sleep problems. Loss of appetite. Dizziness. Unusually fast heartbeat. Stomach pains. Headaches. You have new or worsening behavior problems. You are struggling with anxiety, depression, or substance abuse. Get help right away if: You have a severe reaction to a medicine. These symptoms may be an emergency. Get help right away. Call 911. Do not wait to see if the symptoms will go away. Do not drive  yourself to the hospital. Take one of these steps if you feel like you may hurt yourself or others, or have thoughts about taking your own life: Go to your nearest emergency room. Call 911. Call the National Suicide Prevention Lifeline at 407 159 0130 or 988. This is open 24 hours a day. Text the Crisis Text Line at (901)436-2918. Summary With treatment and support, you can live with ADHD and manage your symptoms. Consider taking part in family therapy or self-help groups with family members or friends. When you talk with friends and family about your ADHD, be patient and communicate openly. Keep all follow-up visits. Your health care provider will need to monitor your condition and adjust your treatment over time. This information is not intended to replace advice given to you by your health care provider. Make sure you discuss any questions you have with your health care provider. Document Revised: 07/14/2021 Document Reviewed: 07/14/2021 Elsevier Patient Education  2024 [employer].

## 2024-03-27 ENCOUNTER — Ambulatory Visit: Admitting: Nurse Practitioner

## 2024-04-17 ENCOUNTER — Encounter: Payer: Self-pay | Admitting: Nurse Practitioner

## 2024-04-18 NOTE — Patient Instructions (Signed)
 Be Involved in Caring For Your Health:  Taking Medications When medications are taken as directed, they can greatly improve your health. But if they are not taken as prescribed, they may not work. In some cases, not taking them correctly can be harmful. To help ensure your treatment remains effective and safe, understand your medications and how to take them. Bring your medications to each visit for review by your provider.  Your lab results, notes, and after visit summary will be available on My Chart. We strongly encourage you to use this feature. If lab results are abnormal the clinic will contact you with the appropriate steps. If the clinic does not contact you assume the results are satisfactory. You can always view your results on My Chart. If you have questions regarding your health or results, please contact the clinic during office hours. You can also ask questions on My Chart.  We at [facility] are grateful that you chose us  to provide your care. We strive to provide evidence-based and compassionate care and are always looking for feedback. If you get a survey from the clinic please complete this so we can hear your opinions.  Healthy Eating, Adult Healthy eating may help you get and keep a healthy body weight, reduce the risk of chronic disease, and live a long and productive life. It is important to follow a healthy eating pattern. Your nutritional and calorie needs should be met mainly by different nutrient-rich foods. What are tips for following this plan? Reading food labels Read labels and choose the following: Reduced or low sodium products. Juices with 100% fruit juice. Foods with low saturated fats (<3 g per serving) and high polyunsaturated and monounsaturated fats. Foods with whole grains, such as whole wheat, cracked wheat, brown rice, and wild rice. Whole grains that are fortified with folic acid. This is recommended for females who are pregnant or who want to  become pregnant. Read labels and do not eat or drink the following: Foods or drinks with added sugars. These include foods that contain brown sugar, corn sweetener, corn syrup, dextrose , fructose, glucose, high-fructose corn syrup, honey, invert sugar, lactose, malt syrup, maltose, molasses, raw sugar, sucrose, trehalose, or turbinado sugar. Limit your intake of added sugars to less than 10% of your total daily calories. Do not eat more than the following amounts of added sugar per day: 6 teaspoons (25 g) for females. 9 teaspoons (38 g) for males. Foods that contain processed or refined starches and grains. Refined grain products, such as white flour, degermed cornmeal, white bread, and white rice. Shopping Choose nutrient-rich snacks, such as vegetables, whole fruits, and nuts. Avoid high-calorie and high-sugar snacks, such as potato chips, fruit snacks, and candy. Use oil-based dressings and spreads on foods instead of solid fats such as butter, margarine, sour cream, or cream cheese. Limit pre-made sauces, mixes, and instant products such as flavored rice, instant noodles, and ready-made pasta. Try more plant-protein sources, such as tofu, tempeh, black beans, edamame, lentils, nuts, and seeds. Explore eating plans such as the Mediterranean diet or vegetarian diet. Try heart-healthy dips made with beans and healthy fats like hummus and guacamole. Vegetables go great with these. Cooking Use oil to saut or stir-fry foods instead of solid fats such as butter, margarine, or lard. Try baking, boiling, grilling, or broiling instead of frying. Remove the fatty part of meats before cooking. Steam vegetables in water  or broth. Meal planning  At meals, imagine dividing your plate into fourths: One-half of  your plate is fruits and vegetables. One-fourth of your plate is whole grains. One-fourth of your plate is protein, especially lean meats, poultry, eggs, tofu, beans, or nuts. Include low-fat  dairy as part of your daily diet. Lifestyle Choose healthy options in all settings, including home, work, school, restaurants, or stores. Prepare your food safely: Wash your hands after handling raw meats. Where you prepare food, keep surfaces clean by regularly washing with hot, soapy water . Keep raw meats separate from ready-to-eat foods, such as fruits and vegetables. Cook seafood, meat, poultry, and eggs to the recommended temperature. Get a food thermometer. Store foods at safe temperatures. In general: Keep cold foods at 84F (4.4C) or below. Keep hot foods at 184F (60C) or above. Keep your freezer at [facility] (-17.8C) or below. Foods are not safe to eat if they have been between the temperatures of 40-184F (4.4-60C) for more than 2 hours. What foods should I eat? Fruits Aim to eat 1-2 cups of fresh, canned (in natural juice), or frozen fruits each day. One cup of fruit equals 1 small apple, 1 large banana, 8 large strawberries, 1 cup (237 g) canned fruit,  cup (82 g) dried fruit, or 1 cup (240 mL) 100% juice. Vegetables Aim to eat 2-4 cups of fresh and frozen vegetables each day, including different varieties and colors. One cup of vegetables equals 1 cup (91 g) broccoli or cauliflower florets, 2 medium carrots, 2 cups (150 g) raw, leafy greens, 1 large tomato, 1 large bell pepper, 1 large sweet potato, or 1 medium white potato. Grains Aim to eat 5-10 ounce-equivalents of whole grains each day. Examples of 1 ounce-equivalent of grains include 1 slice of bread, 1 cup (40 g) ready-to-eat cereal, 3 cups (24 g) popcorn, or  cup (93 g) cooked rice. Meats and other proteins Try to eat 5-7 ounce-equivalents of protein each day. Examples of 1 ounce-equivalent of protein include 1 egg,  oz nuts (12 almonds, 24 pistachios, or 7 walnut halves), 1/4 cup (90 g) cooked beans, 6 tablespoons (90 g) hummus or 1 tablespoon (16 g) peanut butter. A cut of meat or fish that is the size of a deck of  cards is about 3-4 ounce-equivalents (85 g). Of the protein you eat each week, try to have at least 8 sounce (227 g) of seafood. This is about 2 servings per week. This includes salmon, trout, herring, sardines, and anchovies. Dairy Aim to eat 3 cup-equivalents of fat-free or low-fat dairy each day. Examples of 1 cup-equivalent of dairy include 1 cup (240 mL) milk, 8 ounces (250 g) yogurt, 1 ounces (44 g) natural cheese, or 1 cup (240 mL) fortified soy milk. Fats and oils Aim for about 5 teaspoons (21 g) of fats and oils per day. Choose monounsaturated fats, such as canola and olive oils, mayonnaise made with olive oil or avocado oil, avocados, peanut butter, and most nuts, or polyunsaturated fats, such as sunflower, corn, and soybean oils, walnuts, pine nuts, sesame seeds, sunflower seeds, and flaxseed. Beverages Aim for 6 eight-ounce glasses of water  per day. Limit coffee to 3-5 eight-ounce cups per day. Limit caffeinated beverages that have added calories, such as soda and energy drinks. If you drink alcohol: Limit how much you have to: 0-1 drink a day if you are female. 0-2 drinks a day if you are female. Know how much alcohol is in your drink. In the [place] one drink is one 12 oz bottle of beer (355 mL), one 5 oz glass of wine (  148 mL), or one 1 oz glass of hard liquor (44 mL). Seasoning and other foods Try not to add too much salt to your food. Try using herbs and spices instead of salt. Try not to add sugar to food. This information is based on [place] nutrition guidelines. To learn more, visit DisposableNylon.be. Exact amounts may vary. You may need different amounts. This information is not intended to replace advice given to you by your health care provider. Make sure you discuss any questions you have with your health care provider. Document Revised: 12/25/2021 Document Reviewed: 12/25/2021 Elsevier Patient Education  2024 [employer].

## 2024-04-22 ENCOUNTER — Ambulatory Visit: Admitting: Nurse Practitioner

## 2024-04-22 VITALS — BP 126/80 | HR 92 | Temp 98.6°F | Resp 18 | Ht 62.52 in | Wt 264.8 lb

## 2024-04-22 DIAGNOSIS — Z6841 Body Mass Index (BMI) 40.0 and over, adult: Secondary | ICD-10-CM

## 2024-04-22 DIAGNOSIS — F902 Attention-deficit hyperactivity disorder, combined type: Secondary | ICD-10-CM | POA: Diagnosis not present

## 2024-04-22 DIAGNOSIS — E66813 Obesity, class 3: Secondary | ICD-10-CM

## 2024-04-22 DIAGNOSIS — F418 Other specified anxiety disorders: Secondary | ICD-10-CM | POA: Diagnosis not present

## 2024-04-22 DIAGNOSIS — N951 Menopausal and female climacteric states: Secondary | ICD-10-CM | POA: Insufficient documentation

## 2024-04-22 DIAGNOSIS — G4733 Obstructive sleep apnea (adult) (pediatric): Secondary | ICD-10-CM | POA: Diagnosis not present

## 2024-04-22 MED ORDER — LISDEXAMFETAMINE DIMESYLATE 20 MG PO CAPS: 20.0000 mg | ORAL_CAPSULE | Freq: Every day | ORAL | 0 refills | Status: AC

## 2024-04-22 MED ORDER — TIRZEPATIDE-WEIGHT MANAGEMENT 2.5 MG/0.5ML ~~LOC~~ SOLN: 2.5000 mg | SUBCUTANEOUS | 3 refills | Status: DC

## 2024-04-22 NOTE — Assessment & Plan Note (Signed)
 Ongoing, started in childhood. Will trial change to Vyvanse  per request, start at 20 MG. Stop regular Adderall. If unable to get Vyvanse  she may like to see if previous Adderall XR is available.  In past did not tolerate Ritalin or Concerta per her report, will avoid these.  UDS due next 06/07/24 and contract up to date.  She is aware of need for every 3 month visits and yearly UDS, agrees with this plan.

## 2024-04-22 NOTE — Assessment & Plan Note (Signed)
 Diagnosed in July 2022, moderate OSA. Did not tolerate CPAP, tried multiple but all ended up on the floor.

## 2024-04-22 NOTE — Progress Notes (Signed)
 "  BP 126/80 (BP Location: Left Arm, Patient Position: Sitting, Cuff Size: Large)   Pulse 92   Temp 98.6 F (37 C) (Oral)   Resp 18   Ht 5' 2.52 (1.588 m)   Wt 264 lb 12.8 oz (120.1 kg)   SpO2 94%   BMI 47.63 kg/m    Subjective:    Patient ID: [name], female    DOB: [birth year], 39 y.o.   MRN: [id number]  HPI: [name] is a 39 y.o. female  Chief Complaint  Patient presents with   Follow-up    Med check and getting started back on GLP-1s   WEIGHT GAIN Took Ozempic , Wegovy , and Mounjaro  in the past. Stopped taking due to cost. Has underlying OSA and PCOS. Has been trying to lose weight for over 6 months with healthy diet and exercise on elliptical at home 5 days a week for over 30 minutes, but has been unable to lose. Perimenopause present. Has hot flashes during the day and having irregular cycles. Duration: chronic Previous attempts at weight loss: yes Complications of obesity: OSA Peak weight: 265 lbs Weight loss goal: 180 lbs Weight loss to date: 0 lbs Requesting obesity pharmacotherapy: yes Current weight loss supplements/medications: no Previous weight loss supplements/meds: yes Calories:  2000  SLEEP APNEA Tested on 09/28/20 and noted moderate sleep apnea. Has tried CPAP multiple times but always ends up on the floor.  Sleep apnea status: stable Duration: chronic Satisfied with current treatment?:  yes CPAP use:  no Last sleep study: 09/28/20 Treatments attempted: CPAP Wakes feeling refreshed:  no Daytime hypersomnolence:  no Fatigue:  yes Insomnia:  yes Good sleep hygiene:  yes Difficulty falling asleep:  yes Difficulty staying asleep:  yes Snoring bothers bed partner:  no Observed apnea by bed partner: no Obesity:  yes Hypertension: no  Pulmonary hypertension:  no Coronary artery disease:  no  ADHD FOLLOW UP Taking Adderall 20 MG daily, which offers her benefit, but she would like to try changing to Vyvanse  as has some concerns  with Adderall and when she misses doses.  PDMP review last fill 03/24/24. ADHD status: controlled Satisfied with current therapy: yes Medication compliance:  good compliance Controlled substance contract: yes Previous psychiatry evaluation: no Previous medications: yes adderall and adderall XR   Taking meds on weekends/vacations: yes Work/school performance:  good Difficulty sustaining attention/completing tasks: no Distracted by extraneous stimuli: no Does not listen when spoken to: no  Fidgets with hands or feet: no Unable to stay in seat: no Blurts out/interrupts others: no ADHD Medication Side Effects: no    Decreased appetite: no    Headache: no    Sleeping disturbance pattern: no    Irritability: no    Rebound effects (worse than baseline) off medication: no    Anxiousness: no    Dizziness: no    Tics: no   ANXIETY/STRESS Taking Effexor  XR 75 MG daily.  Mood is exacerbated due to PCOS. When took Wegovy  in past her symptoms were well controlled, including mood. Duration: stable Anxious mood: exacerbated due to PCOS Excessive worrying: yes Irritability: yes Sweating: no Nausea: no Palpitations:no Hyperventilation: no Panic attacks: no Agoraphobia: no  Obscessions/compulsions: no Depressed mood: yes    04/22/2024   12:59 PM 12/20/2023    7:45 PM 06/18/2023    1:14 PM 08/21/2022    1:04 PM 02/19/2022    3:42 PM  Depression screen PHQ 2/9  Decreased Interest 1 1 1  0 1  Down, Depressed, Hopeless  1 0 0 0 1  PHQ - 2 Score 2 1 1  0 2  Altered sleeping 1 1 2 3 2   Tired, decreased energy 2 2 2 3 1   Change in appetite 0 2 0 0 1  Feeling bad or failure about yourself  0 2 0 0 1  Trouble concentrating 0 1 1 3 1   Moving slowly or fidgety/restless 0 0 0 0 0  Suicidal thoughts 0 0 0 0 0  PHQ-9 Score 5 9  6  9  8    Difficult doing work/chores Not difficult at all Somewhat difficult Not difficult at all Somewhat difficult      Data saved with a previous flowsheet row  definition  Anhedonia: no Weight changes: no Insomnia: yes hard to fall asleep  Hypersomnia: no Fatigue/loss of energy: no Feelings of worthlessness: no Feelings of guilt: no Impaired concentration/indecisiveness: no Suicidal ideations: no  Crying spells: no Recent Stressors/Life Changes: yes   Relationship problems: no   Family stress: no   Financial stress:no   Job stress: no    Recent death/loss: no     05/03/2024   12:59 PM 12/20/2023    7:46 PM 06/18/2023    1:14 PM 08/21/2022    1:06 PM  GAD 7 : Generalized Anxiety Score  Nervous, Anxious, on Edge 0 2 0 1  Control/stop worrying 0 2 0 1  Worry too much - different things 0 2 1 1   Trouble relaxing 1 1 1 3   Restless 0 1 0 1  Easily annoyed or irritable 1 2 1 3   Afraid - awful might happen 0 1 0 0  Total GAD 7 Score 2 11 3 10   Anxiety Difficulty Not difficult at all Somewhat difficult Not difficult at all Somewhat difficult   Relevant past medical, surgical, family and social history reviewed and updated as indicated. Interim medical history since our last visit reviewed. Allergies and medications reviewed and updated.  Review of Systems  Constitutional:  Positive for fatigue. Negative for activity change, appetite change, diaphoresis and fever.  Respiratory:  Negative for cough, chest tightness, shortness of breath and wheezing.   Cardiovascular:  Negative for chest pain, palpitations and leg swelling.  Gastrointestinal: Negative.   Neurological: Negative.   Psychiatric/Behavioral:  Positive for decreased concentration and sleep disturbance. Negative for self-injury and suicidal ideas. The patient is nervous/anxious.    Per HPI unless specifically indicated above     Objective:    BP 126/80 (BP Location: Left Arm, Patient Position: Sitting, Cuff Size: Large)   Pulse 92   Temp 98.6 F (37 C) (Oral)   Resp 18   Ht 5' 2.52 (1.588 m)   Wt 264 lb 12.8 oz (120.1 kg)   SpO2 94%   BMI 47.63 kg/m   Wt Readings from  Last 3 Encounters:  03-May-2024 264 lb 12.8 oz (120.1 kg)  01/21/24 255 lb (115.7 kg)  01/07/24 250 lb (113.4 kg)    Physical Exam Vitals and nursing note reviewed.  Constitutional:      General: She is awake. She is not in acute distress.    Appearance: She is well-developed and well-groomed. She is obese. She is not ill-appearing or toxic-appearing.  HENT:     Head: Normocephalic.     Right Ear: Hearing normal.     Left Ear: Hearing normal.     Nose: Nose normal.     Mouth/Throat:     Mouth: Mucous membranes are moist.  Eyes:  General: Lids are normal.        Right eye: No discharge.        Left eye: No discharge.     Conjunctiva/sclera: Conjunctivae normal.     Pupils: Pupils are equal, round, and reactive to light.  Neck:     Thyroid : No thyromegaly.     Vascular: No carotid bruit or JVD.  Cardiovascular:     Rate and Rhythm: Normal rate and regular rhythm.     Heart sounds: Normal heart sounds. No murmur heard.    No gallop.  Pulmonary:     Effort: Pulmonary effort is normal.     Breath sounds: Normal breath sounds. No decreased breath sounds, wheezing or rales.  Abdominal:     General: Bowel sounds are normal.     Palpations: Abdomen is soft.  Musculoskeletal:     Cervical back: Normal range of motion and neck supple.     Right lower leg: No edema.     Left lower leg: No edema.  Lymphadenopathy:     Cervical: No cervical adenopathy.  Skin:    General: Skin is warm and dry.  Neurological:     Mental Status: She is alert and oriented to person, place, and time.  Psychiatric:        Attention and Perception: Attention normal.        Mood and Affect: Mood normal.        Behavior: Behavior normal. Behavior is cooperative.        Thought Content: Thought content normal.        Judgment: Judgment normal.    Results for orders placed or performed in visit on 01/07/24  Vitamin B12   Collection Time: 01/07/24  8:59 AM  Result Value Ref Range   Vitamin B-12 452  200 - 1,100 pg/mL      Assessment & Plan:   Problem List Items Addressed This Visit       Respiratory   Obstructive sleep apnea of adult   Diagnosed in July 2022, moderate OSA. Did not tolerate CPAP, tried multiple but all ended up on the floor.        Other   Situational anxiety   Chronic, ongoing.  Denies SI/HI.  Continue Effexor  XR 75 MG daily dosing and adjust further as needed.  Recommend meditation and relaxation exercises at home.        Perimenopause   Refer to obesity and anxiety plans of care.      Obesity - Primary   BMI 47.63 with PCOS and OSA. Lost weight with GLP1s in past, but became too costly. Tried Topamax  with no benefit to weight loss and side effects presented. Will start Zepound 2.5 MG weekly via [employer] [facility] per request, she wishes to get vials. Script sent. No family history of thyroid  cancer (MTC, MEN 2, thyroid  cell tumors) or pancreatitis.   Recommended eating smaller high protein, low fat meals more frequently and exercising 30 mins a day 5 times a week with a goal of 10-15lb weight loss in the next 3 months. Patient voiced their understanding and motivation to adhere to these recommendations.         Relevant Medications   tirzepatide  (ZEPBOUND ) 2.5 MG/0.5ML injection vial   lisdexamfetamine  (VYVANSE ) 20 MG capsule   ADHD (attention deficit hyperactivity disorder)   Ongoing, started in childhood. Will trial change to Vyvanse  per request, start at 20 MG. Stop regular Adderall. If unable to get Vyvanse  she may like to see  if previous Adderall XR is available.  In past did not tolerate Ritalin or Concerta per her report, will avoid these.  UDS due next 06/07/24 and contract up to date.  She is aware of need for every 3 month visits and yearly UDS, agrees with this plan.         Follow up plan: Return in about 5 weeks (around 05/27/2024) for WEIGHT AND ADD.      "

## 2024-04-22 NOTE — Assessment & Plan Note (Addendum)
 BMI 47.63 with PCOS and OSA. Lost weight with GLP1s in past, but became too costly. Tried Topamax  with no benefit to weight loss and side effects presented. Will start Zepound 2.5 MG weekly via [employer] per request, she wishes to get vials. Script sent. No family history of thyroid  cancer (MTC, MEN 2, thyroid  cell tumors) or pancreatitis.   Recommended eating smaller high protein, low fat meals more frequently and exercising 30 mins a day 5 times a week with a goal of 10-15lb weight loss in the next 3 months. Patient voiced their understanding and motivation to adhere to these recommendations.

## 2024-04-22 NOTE — Assessment & Plan Note (Signed)
 Refer to obesity and anxiety plans of care.

## 2024-04-22 NOTE — Assessment & Plan Note (Signed)
 Chronic, ongoing.  Denies SI/HI.  Continue Effexor  XR 75 MG daily dosing and adjust further as needed.  Recommend meditation and relaxation exercises at home.

## 2024-04-30 ENCOUNTER — Ambulatory Visit: Admitting: Nurse Practitioner

## 2024-05-06 DIAGNOSIS — Z1231 Encounter for screening mammogram for malignant neoplasm of breast: Secondary | ICD-10-CM

## 2024-05-14 ENCOUNTER — Other Ambulatory Visit: Payer: Self-pay | Admitting: Nurse Practitioner

## 2024-05-14 MED ORDER — TIRZEPATIDE-WEIGHT MANAGEMENT 5 MG/0.5ML ~~LOC~~ SOLN: 5.0000 mg | SUBCUTANEOUS | 3 refills | Status: AC

## 2024-05-15 NOTE — Telephone Encounter (Signed)
 Requested medications are due for refill today.  yes  Requested medications are on the active medications list.  yes  Last refill. 04/22/2024 #30 0 rf  Future visit scheduled.   yes  Notes to clinic.  Refill not delegated.    Requested Prescriptions  Pending Prescriptions Disp Refills   lisdexamfetamine  (VYVANSE ) 20 MG capsule [Pharmacy Med Name: LISDEXAMFETAMINE  DIMESYLATE 20 MG C] 30 capsule     Sig: TAKE 1 CAPSULE BY MOUTH DAILY.     Not Delegated - Psychiatry:  Stimulants/ADHD Failed - 05/15/2024  3:57 PM      Failed - This refill cannot be delegated      Passed - Urine Drug Screen completed in last 360 days      Passed - Last BP in normal range    BP Readings from Last 1 Encounters:  04/22/24 126/80         Passed - Last Heart Rate in normal range    Pulse Readings from Last 1 Encounters:  04/22/24 92         Passed - Valid encounter within last 6 months    Recent Outpatient Visits           3 weeks ago Class 3 severe obesity due to excess calories with serious comorbidity and body mass index (BMI) of 45.0 to 49.9 in adult [facility])   [place] [facility] [place], Melanie T, NP   4 months ago PCOS (polycystic ovarian syndrome)   [place] [facility] [place], Melanie T, NP   6 months ago Attention deficit hyperactivity disorder (ADHD), combined type   [place] [facility] [place], Melanie DASEN, NP   8 months ago Flank pain   [place] [facility] [place], [place] T, NP   11 months ago Attention deficit hyperactivity disorder (ADHD), combined type   [place] [facility] [place], Melanie DASEN, NP

## 2024-06-03 ENCOUNTER — Ambulatory Visit: Admitting: Nurse Practitioner
